# Patient Record
Sex: Female | Born: 1945 | Race: White | Hispanic: No | Smoking: Never smoker
Health system: Southern US, Community
[De-identification: ages and names within clinical notes are randomized; demographics above are authoritative.]

## PROBLEM LIST (undated history)

## (undated) DIAGNOSIS — G4730 Sleep apnea, unspecified: Secondary | ICD-10-CM

## (undated) DIAGNOSIS — M5136 Other intervertebral disc degeneration, lumbar region: Secondary | ICD-10-CM

## (undated) DIAGNOSIS — R7303 Prediabetes: Secondary | ICD-10-CM

## (undated) DIAGNOSIS — T4145XA Adverse effect of unspecified anesthetic, initial encounter: Secondary | ICD-10-CM

## (undated) DIAGNOSIS — E2839 Other primary ovarian failure: Secondary | ICD-10-CM

## (undated) DIAGNOSIS — Z8619 Personal history of other infectious and parasitic diseases: Secondary | ICD-10-CM

## (undated) DIAGNOSIS — H40033 Anatomical narrow angle, bilateral: Secondary | ICD-10-CM

## (undated) DIAGNOSIS — Z8711 Personal history of peptic ulcer disease: Secondary | ICD-10-CM

## (undated) DIAGNOSIS — H269 Unspecified cataract: Secondary | ICD-10-CM

## (undated) DIAGNOSIS — Z8719 Personal history of other diseases of the digestive system: Secondary | ICD-10-CM

## (undated) DIAGNOSIS — Z87442 Personal history of urinary calculi: Secondary | ICD-10-CM

## (undated) DIAGNOSIS — H409 Unspecified glaucoma: Secondary | ICD-10-CM

## (undated) DIAGNOSIS — G709 Myoneural disorder, unspecified: Secondary | ICD-10-CM

## (undated) DIAGNOSIS — B9620 Unspecified Escherichia coli [E. coli] as the cause of diseases classified elsewhere: Secondary | ICD-10-CM

## (undated) DIAGNOSIS — K219 Gastro-esophageal reflux disease without esophagitis: Secondary | ICD-10-CM

## (undated) DIAGNOSIS — M179 Osteoarthritis of knee, unspecified: Secondary | ICD-10-CM

## (undated) DIAGNOSIS — M51369 Other intervertebral disc degeneration, lumbar region without mention of lumbar back pain or lower extremity pain: Secondary | ICD-10-CM

## (undated) DIAGNOSIS — I071 Rheumatic tricuspid insufficiency: Secondary | ICD-10-CM

## (undated) DIAGNOSIS — N390 Urinary tract infection, site not specified: Secondary | ICD-10-CM

## (undated) DIAGNOSIS — M48062 Spinal stenosis, lumbar region with neurogenic claudication: Secondary | ICD-10-CM

## (undated) DIAGNOSIS — J301 Allergic rhinitis due to pollen: Secondary | ICD-10-CM

## (undated) DIAGNOSIS — M1710 Unilateral primary osteoarthritis, unspecified knee: Secondary | ICD-10-CM

## (undated) DIAGNOSIS — D509 Iron deficiency anemia, unspecified: Secondary | ICD-10-CM

## (undated) DIAGNOSIS — I10 Essential (primary) hypertension: Secondary | ICD-10-CM

## (undated) DIAGNOSIS — E785 Hyperlipidemia, unspecified: Secondary | ICD-10-CM

## (undated) HISTORY — DX: Rheumatic tricuspid insufficiency: I07.1

## (undated) HISTORY — PX: OTHER SURGICAL HISTORY: SHX169

## (undated) HISTORY — DX: Unilateral primary osteoarthritis, unspecified knee: M17.10

## (undated) HISTORY — DX: Unspecified Escherichia coli (E. coli) as the cause of diseases classified elsewhere: B96.20

## (undated) HISTORY — DX: Iron deficiency anemia, unspecified: D50.9

## (undated) HISTORY — DX: Anatomical narrow angle, bilateral: H40.033

## (undated) HISTORY — DX: Other intervertebral disc degeneration, lumbar region: M51.36

## (undated) HISTORY — PX: EYE SURGERY: SHX253

## (undated) HISTORY — DX: Other intervertebral disc degeneration, lumbar region without mention of lumbar back pain or lower extremity pain: M51.369

## (undated) HISTORY — DX: Hyperlipidemia, unspecified: E78.5

## (undated) HISTORY — PX: APPENDECTOMY: SHX54

## (undated) HISTORY — DX: Personal history of peptic ulcer disease: Z87.11

## (undated) HISTORY — PX: FOOT SURGERY: SHX648

## (undated) HISTORY — DX: Prediabetes: R73.03

## (undated) HISTORY — DX: Osteoarthritis of knee, unspecified: M17.9

## (undated) HISTORY — PX: VAGINAL HYSTERECTOMY: SUR661

## (undated) HISTORY — DX: Unspecified glaucoma: H40.9

## (undated) HISTORY — DX: Allergic rhinitis due to pollen: J30.1

## (undated) HISTORY — DX: Urinary tract infection, site not specified: N39.0

## (undated) HISTORY — DX: Unspecified cataract: H26.9

## (undated) HISTORY — DX: Essential (primary) hypertension: I10

## (undated) HISTORY — DX: Spinal stenosis, lumbar region with neurogenic claudication: M48.062

## (undated) HISTORY — DX: Personal history of other diseases of the digestive system: Z87.19

## (undated) HISTORY — DX: Personal history of other infectious and parasitic diseases: Z86.19

---

## 2003-09-01 HISTORY — PX: TOE SURGERY: SHX1073

## 2010-08-31 DIAGNOSIS — D509 Iron deficiency anemia, unspecified: Secondary | ICD-10-CM

## 2010-08-31 DIAGNOSIS — I071 Rheumatic tricuspid insufficiency: Secondary | ICD-10-CM

## 2010-08-31 HISTORY — DX: Rheumatic tricuspid insufficiency: I07.1

## 2010-08-31 HISTORY — DX: Iron deficiency anemia, unspecified: D50.9

## 2011-08-01 HISTORY — PX: ESOPHAGOGASTRODUODENOSCOPY: SHX1529

## 2011-08-01 HISTORY — PX: COLONOSCOPY: SHX174

## 2011-08-10 LAB — HM COLONOSCOPY

## 2011-11-06 ENCOUNTER — Ambulatory Visit: Payer: Self-pay | Admitting: Internal Medicine

## 2011-11-24 ENCOUNTER — Ambulatory Visit: Payer: Self-pay | Admitting: Internal Medicine

## 2011-11-24 LAB — HM MAMMOGRAPHY

## 2011-12-18 ENCOUNTER — Ambulatory Visit: Payer: Self-pay | Admitting: Internal Medicine

## 2012-11-24 ENCOUNTER — Ambulatory Visit: Payer: Self-pay | Admitting: Internal Medicine

## 2012-11-24 LAB — HM MAMMOGRAPHY

## 2013-12-22 ENCOUNTER — Ambulatory Visit: Payer: Self-pay | Admitting: Internal Medicine

## 2013-12-22 LAB — HM MAMMOGRAPHY

## 2014-08-31 DIAGNOSIS — M48062 Spinal stenosis, lumbar region with neurogenic claudication: Secondary | ICD-10-CM

## 2014-08-31 HISTORY — DX: Spinal stenosis, lumbar region with neurogenic claudication: M48.062

## 2014-09-20 ENCOUNTER — Ambulatory Visit: Payer: Self-pay | Admitting: General Practice

## 2014-10-12 LAB — COMPREHENSIVE METABOLIC PANEL
ALK PHOS: 69 U/L
ALT: 15
ALT: 15
AST: 21 U/L
AST: 21 U/L
Alkaline Phosphatase: 69 U/L
BUN: 26 mg/dL — AB (ref 4–21)
Bilirubin: 0.8
CREATININE: 0.71
CREATININE: 0.71
GLUCOSE: 105
Glucose: 105
POTASSIUM: 4.2 mmol/L
POTASSIUM: 4.2 mmol/L
SODIUM: 138
Sodium: 138

## 2014-10-12 LAB — LIPID PANEL
CHOLESTEROL, TOTAL: 238
Cholesterol, Total: 238
HDL: 96 mg/dL — AB (ref 35–70)
HDL: 96 mg/dL — AB (ref 35–70)
LDL (calc): 120
LDL CALC: 120
TRIGLYCERIDES: 110
Triglycerides: 110

## 2014-10-12 LAB — HEMOGLOBIN A1C
A1C: 6.2
A1c: 6.2

## 2014-10-12 LAB — CBC
HGB: 12.4 g/dL
WBC: 7.2
platelet count: 302

## 2014-12-25 ENCOUNTER — Encounter: Payer: Self-pay | Admitting: Family Medicine

## 2014-12-25 ENCOUNTER — Encounter (INDEPENDENT_AMBULATORY_CARE_PROVIDER_SITE_OTHER): Payer: Self-pay

## 2014-12-25 ENCOUNTER — Ambulatory Visit (INDEPENDENT_AMBULATORY_CARE_PROVIDER_SITE_OTHER): Payer: Medicare Other | Admitting: Family Medicine

## 2014-12-25 VITALS — BP 128/68 | HR 66 | Temp 98.2°F | Ht 58.75 in | Wt 142.1 lb

## 2014-12-25 DIAGNOSIS — E785 Hyperlipidemia, unspecified: Secondary | ICD-10-CM | POA: Insufficient documentation

## 2014-12-25 DIAGNOSIS — M5136 Other intervertebral disc degeneration, lumbar region: Secondary | ICD-10-CM | POA: Diagnosis not present

## 2014-12-25 DIAGNOSIS — M1710 Unilateral primary osteoarthritis, unspecified knee: Secondary | ICD-10-CM | POA: Insufficient documentation

## 2014-12-25 DIAGNOSIS — I10 Essential (primary) hypertension: Secondary | ICD-10-CM | POA: Diagnosis not present

## 2014-12-25 DIAGNOSIS — H409 Unspecified glaucoma: Secondary | ICD-10-CM | POA: Insufficient documentation

## 2014-12-25 DIAGNOSIS — M179 Osteoarthritis of knee, unspecified: Secondary | ICD-10-CM | POA: Insufficient documentation

## 2014-12-25 DIAGNOSIS — M170 Bilateral primary osteoarthritis of knee: Secondary | ICD-10-CM

## 2014-12-25 MED ORDER — SIMVASTATIN 10 MG PO TABS: 10.0000 mg | ORAL_TABLET | Freq: Every day | ORAL | Status: DC

## 2014-12-25 NOTE — Progress Notes (Signed)
BP 128/68 mmHg  Pulse 66  Temp(Src) 98.2 F (36.8 C) (Oral)  Ht 4' 10.75" (1.492 m)  Wt 142 lb 1.9 oz (64.465 kg)  BMI 28.96 kg/m2   CC: new pt to establish  Subjective:    Patient ID: Brandy Young, female    DOB: November 12, 1945, 69 y.o.   MRN: [id number]  HPI: Brandy Young is a 69 y.o. female presenting on 12/25/2014 for Establish Care   Prior saw Dr Yves Dill.   Glaucoma - eye drops, followed by Dr Inez Pilgrim. S/p L eye surgery.  HTN - compliant with lisinopril hctz daily. No HA, vision changes, CP/tightness, SOB, leg swelling.   HLD - compliant with simvastatin. No myalgias.  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 Started on meloxicam  daily, also started on omeprazole daily. Has had MRIs in the past. S/p L knee injection without improvement. Wants to avoid knee replacement if possible. Has also received ESI by Dr Yves Dill. Lower leg pain thought coming from bad back. Continues to function - works at [employer]. No claudication sxs. On hydrocodone and meloxicam today.   Preventative: Flu shot - done Pneumococcal 2015, prevnar at [facility] zostavax 2010  Lives alone, 1 dog. Widow - heart issues Children nearby Occupation: part time Engineer, [employer]: HS Activity: stays active with family and church Diet: good water, fruits/vegetables daily  Relevant past medical, surgical, family and social history reviewed and updated as indicated. Interim medical history since our last visit reviewed. Allergies and medications reviewed and updated. No current outpatient prescriptions on file prior to visit.   No current facility-administered medications on file prior to visit.    Review of Systems Per HPI unless specifically indicated above     Objective:    BP 128/68 mmHg  Pulse 66  Temp(Src) 98.2 F (36.8 C) (Oral)  Ht 4' 10.75" (1.492 m)  Wt 142 lb 1.9 oz (64.465 kg)  BMI 28.96 kg/m2  Wt Readings from  Last 3 Encounters:  12/25/14 142 lb 1.9 oz (64.465 kg)    Physical Exam  Constitutional: She appears well-developed and well-nourished. No distress.  HENT:  Head: Normocephalic and atraumatic.  Mouth/Throat: Oropharynx is clear and moist. No oropharyngeal exudate.  Eyes: Conjunctivae and EOM are normal. Pupils are equal, round, and reactive to light. No scleral icterus.  Neck: Normal range of motion.  Cardiovascular: Normal rate, regular rhythm, normal heart sounds and intact distal pulses.   No murmur heard. Pulmonary/Chest: Effort normal and breath sounds normal. No respiratory distress. She has no wheezes. She has no rales.  Musculoskeletal: She exhibits no edema.  Lymphadenopathy:    She has no cervical adenopathy.  Skin: Skin is warm and dry. No rash noted.  Psychiatric: She has a normal mood and affect.  Nursing note and vitals reviewed.  No results found for this or any previous visit.    Assessment & Plan:   Problem List Items Addressed This Visit    HTN (hypertension) - Primary    Chronic, stable. Continue regimen.       Relevant Medications   lisinopril-hydrochlorothiazide (PRINZIDE,ZESTORETIC) 20-25 MG per tablet   aspirin 81 MG tablet   simvastatin (ZOCOR) 10 MG tablet   HLD (hyperlipidemia)    Chronic, latest LDL 120, but HDL 90s. Continue simvastatin and fish oil. Check FLP next fasting labs.      Relevant Medications   lisinopril-hydrochlorothiazide (PRINZIDE,ZESTORETIC) 20-25 MG per tablet   aspirin  81 MG tablet   simvastatin (ZOCOR) 10 MG tablet   Glaucoma    Followed regularly by ophtho.      Relevant Medications   latanoprost (XALATAN) 0.005 % ophthalmic solution   timolol (BETIMOL) 0.5 % ophthalmic solution   DJD (degenerative joint disease) of knee    Will request records from Dr Ernest PineHooten. Pt wants to avoid knee replacement if at all possible.      Relevant Medications   meloxicam (MOBIC) 15 MG tablet   aspirin 81 MG tablet    HYDROcodone-acetaminophen (NORCO) 7.5-325 MG per tablet   DDD (degenerative disc disease), lumbar    S/p ESI in past. Followed by Dr Yves Dillhasnis, hydrocodone prescribed by him.      Relevant Medications   meloxicam (MOBIC) 15 MG tablet   aspirin 81 MG tablet   HYDROcodone-acetaminophen (NORCO) 7.5-325 MG per tablet       Follow up plan: Return in about 3 months (around 03/26/2015), or as needed, for medicare wellness visit.

## 2014-12-25 NOTE — Assessment & Plan Note (Addendum)
S/p ESI in past. Followed by Dr Yves Dillhasnis, hydrocodone prescribed by him.

## 2014-12-25 NOTE — Progress Notes (Signed)
Pre visit review using our clinic review tool, if applicable. No additional management support is needed unless otherwise documented below in the visit note. 

## 2014-12-25 NOTE — Assessment & Plan Note (Signed)
Chronic, stable. Continue regimen. 

## 2014-12-25 NOTE — Assessment & Plan Note (Signed)
Followed regularly by ophtho.  

## 2014-12-25 NOTE — Assessment & Plan Note (Signed)
Will request records from Dr Ernest PineHooten. Pt wants to avoid knee replacement if at all possible.

## 2014-12-25 NOTE — Patient Instructions (Addendum)
Let's hold meloxicam for now. Monitor pain off this medication.  Return at your convenience over next 3-4 months for medicare wellness visit, prior fasting for blood work  I will request latest physical from Dr Welton FlakesKhan as well as Dr Ernest PineHooten and Chasnis.

## 2014-12-25 NOTE — Assessment & Plan Note (Signed)
Chronic, latest LDL 120, but HDL 90s. Continue simvastatin and fish oil. Check FLP next fasting labs.

## 2014-12-27 ENCOUNTER — Encounter: Payer: Self-pay | Admitting: *Deleted

## 2015-01-04 DIAGNOSIS — H40033 Anatomical narrow angle, bilateral: Secondary | ICD-10-CM | POA: Diagnosis not present

## 2015-01-05 ENCOUNTER — Encounter: Payer: Self-pay | Admitting: Family Medicine

## 2015-01-10 ENCOUNTER — Other Ambulatory Visit: Payer: Self-pay | Admitting: Family Medicine

## 2015-01-10 DIAGNOSIS — Z1231 Encounter for screening mammogram for malignant neoplasm of breast: Secondary | ICD-10-CM

## 2015-01-12 ENCOUNTER — Encounter: Payer: Self-pay | Admitting: Family Medicine

## 2015-01-12 DIAGNOSIS — J301 Allergic rhinitis due to pollen: Secondary | ICD-10-CM | POA: Insufficient documentation

## 2015-01-12 DIAGNOSIS — D509 Iron deficiency anemia, unspecified: Secondary | ICD-10-CM

## 2015-01-12 DIAGNOSIS — M48062 Spinal stenosis, lumbar region with neurogenic claudication: Secondary | ICD-10-CM

## 2015-01-12 DIAGNOSIS — R7303 Prediabetes: Secondary | ICD-10-CM | POA: Insufficient documentation

## 2015-01-18 ENCOUNTER — Ambulatory Visit
Admission: RE | Admit: 2015-01-18 | Discharge: 2015-01-18 | Disposition: A | Discharge status: Home / Self Care | Payer: Medicare Other | Source: Ambulatory Visit | Attending: Family Medicine | Admitting: Family Medicine

## 2015-01-18 DIAGNOSIS — Z1231 Encounter for screening mammogram for malignant neoplasm of breast: Secondary | ICD-10-CM | POA: Diagnosis not present

## 2015-01-21 ENCOUNTER — Encounter: Payer: Self-pay | Admitting: *Deleted

## 2015-01-29 ENCOUNTER — Other Ambulatory Visit: Payer: Self-pay | Admitting: *Deleted

## 2015-01-29 MED ORDER — LISINOPRIL-HYDROCHLOROTHIAZIDE 20-25 MG PO TABS: 1.0000 | ORAL_TABLET | Freq: Every day | ORAL | Status: DC

## 2015-02-06 ENCOUNTER — Encounter: Payer: Self-pay | Admitting: *Deleted

## 2015-02-11 DIAGNOSIS — M4806 Spinal stenosis, lumbar region: Secondary | ICD-10-CM | POA: Diagnosis not present

## 2015-02-11 DIAGNOSIS — M5136 Other intervertebral disc degeneration, lumbar region: Secondary | ICD-10-CM | POA: Diagnosis not present

## 2015-02-11 DIAGNOSIS — M5416 Radiculopathy, lumbar region: Secondary | ICD-10-CM | POA: Diagnosis not present

## 2015-02-27 DIAGNOSIS — M4806 Spinal stenosis, lumbar region: Secondary | ICD-10-CM | POA: Diagnosis not present

## 2015-02-27 DIAGNOSIS — M5416 Radiculopathy, lumbar region: Secondary | ICD-10-CM | POA: Diagnosis not present

## 2015-02-27 DIAGNOSIS — M5136 Other intervertebral disc degeneration, lumbar region: Secondary | ICD-10-CM | POA: Diagnosis not present

## 2015-03-15 ENCOUNTER — Encounter: Payer: Self-pay | Admitting: Family Medicine

## 2015-03-19 DIAGNOSIS — M5416 Radiculopathy, lumbar region: Secondary | ICD-10-CM | POA: Diagnosis not present

## 2015-03-19 DIAGNOSIS — M4806 Spinal stenosis, lumbar region: Secondary | ICD-10-CM | POA: Diagnosis not present

## 2015-03-19 DIAGNOSIS — M5136 Other intervertebral disc degeneration, lumbar region: Secondary | ICD-10-CM | POA: Diagnosis not present

## 2015-04-04 ENCOUNTER — Encounter: Payer: Self-pay | Admitting: Family Medicine

## 2015-04-14 ENCOUNTER — Other Ambulatory Visit: Payer: Self-pay | Admitting: Family Medicine

## 2015-04-14 DIAGNOSIS — I10 Essential (primary) hypertension: Secondary | ICD-10-CM

## 2015-04-14 DIAGNOSIS — R7303 Prediabetes: Secondary | ICD-10-CM

## 2015-04-14 DIAGNOSIS — E785 Hyperlipidemia, unspecified: Secondary | ICD-10-CM

## 2015-04-16 ENCOUNTER — Other Ambulatory Visit (INDEPENDENT_AMBULATORY_CARE_PROVIDER_SITE_OTHER): Payer: Medicare Other

## 2015-04-16 DIAGNOSIS — I10 Essential (primary) hypertension: Secondary | ICD-10-CM

## 2015-04-16 DIAGNOSIS — E785 Hyperlipidemia, unspecified: Secondary | ICD-10-CM

## 2015-04-16 DIAGNOSIS — R7309 Other abnormal glucose: Secondary | ICD-10-CM

## 2015-04-16 DIAGNOSIS — R7303 Prediabetes: Secondary | ICD-10-CM

## 2015-04-16 LAB — HEMOGLOBIN A1C: Hgb A1c MFr Bld: 6.1 % (ref 4.6–6.5)

## 2015-04-16 LAB — BASIC METABOLIC PANEL
BUN: 26 mg/dL — ABNORMAL HIGH (ref 6–23)
CO2: 28 mEq/L (ref 19–32)
CREATININE: 0.62 mg/dL (ref 0.40–1.20)
Calcium: 9.3 mg/dL (ref 8.4–10.5)
Chloride: 105 mEq/L (ref 96–112)
GFR: 100.71 mL/min (ref >60.00)
GLUCOSE: 100 mg/dL — AB (ref 70–99)
POTASSIUM: 3.5 meq/L (ref 3.5–5.1)
Sodium: 139 mEq/L (ref 135–145)

## 2015-04-16 LAB — LIPID PANEL
CHOLESTEROL: 214 mg/dL — AB (ref 0–200)
HDL: 66.5 mg/dL (ref >39.00)
LDL CALC: 127 mg/dL — AB (ref 0–99)
NonHDL: 147.53
Total CHOL/HDL Ratio: 3
Triglycerides: 103 mg/dL (ref 0.0–149.0)
VLDL: 20.6 mg/dL (ref 0.0–40.0)

## 2015-04-19 ENCOUNTER — Encounter: Payer: Self-pay | Admitting: Family Medicine

## 2015-04-19 ENCOUNTER — Ambulatory Visit (INDEPENDENT_AMBULATORY_CARE_PROVIDER_SITE_OTHER): Payer: Medicare Other | Admitting: Family Medicine

## 2015-04-19 VITALS — BP 124/74 | HR 70 | Temp 98.4°F | Ht 59.0 in | Wt 139.2 lb

## 2015-04-19 DIAGNOSIS — E785 Hyperlipidemia, unspecified: Secondary | ICD-10-CM

## 2015-04-19 DIAGNOSIS — Z0000 Encounter for general adult medical examination without abnormal findings: Secondary | ICD-10-CM | POA: Insufficient documentation

## 2015-04-19 DIAGNOSIS — Z7189 Other specified counseling: Secondary | ICD-10-CM | POA: Insufficient documentation

## 2015-04-19 DIAGNOSIS — I10 Essential (primary) hypertension: Secondary | ICD-10-CM

## 2015-04-19 DIAGNOSIS — R0789 Other chest pain: Secondary | ICD-10-CM

## 2015-04-19 DIAGNOSIS — M48062 Spinal stenosis, lumbar region with neurogenic claudication: Secondary | ICD-10-CM

## 2015-04-19 DIAGNOSIS — R079 Chest pain, unspecified: Secondary | ICD-10-CM | POA: Insufficient documentation

## 2015-04-19 DIAGNOSIS — R7303 Prediabetes: Secondary | ICD-10-CM

## 2015-04-19 NOTE — Assessment & Plan Note (Signed)
Chronic, continue simvastatin. Discussed recent labwork as well as healthy diet changes to improve #s specifically LDL.

## 2015-04-19 NOTE — Progress Notes (Addendum)
BP 124/74 mmHg  Pulse 70  Temp(Src) 98.4 F (36.9 C) (Oral)  Ht  (1.499 m)  Wt 139 lb 4 oz (63.163 kg)  BMI 28.11 kg/m2  SpO2 99%   CC: medicare wellness visit  Subjective:    Patient ID: Brandy Young, female    DOB: [birth year], 69 y.o.   MRN: [id number]  HPI: Brandy Young is a 69 y.o. female presenting on 04/19/2015 for Annual Exam   Occasional chest discomfort x2 with mowing yard over last few months in heat - push mower. Last occurred in July. Has not exercised since. Has not seen cardiology in the past.   Passes hearing. Vision screen with eye doctor. Denies depression. 2+ falls in last year. [place].   Preventative: COLONOSCOPY Date: 08/2011 diverticulosis, int hem ([facility]) Well woman exam - s/p hysterectomy, ovaries remain. Aged out. Mammogram - WNL 12/2014 DEXA in [place] >6 yrs ago - normal. No records available.  Flu shot - yearly Tdap 2014 Prevnar 07/2014. pneumovax previously per patient zostavax 2010 Advanced directives: has not set up but has discussed with children. HCPOA - children. Packet provided today. Seat belt use discussed Sunscreen use discussed  Lives alone, 1 dog. Widow - heart issues Children nearby Occupation: part time Engineer, [employer]: HS Activity: stays active with family and church Diet: good water, fruits/vegetables daily  Relevant past medical, surgical, family and social history reviewed and updated as indicated. Interim medical history since our last visit reviewed. Allergies and medications reviewed and updated. Current Outpatient Prescriptions on File Prior to Visit  Medication Sig  . aspirin 81 MG tablet Take 81 mg by mouth daily.  . calcium carbonate (OS-CAL) 600 MG TABS tablet Take 600 mg by mouth 2 (two) times daily with a meal.  . cetirizine (ZYRTEC) 10 MG tablet Take 10 mg by mouth daily.  . cholecalciferol (VITAMIN D) 1000 UNITS tablet Take 1,000  Units by mouth 2 (two) times daily.  Marland Kitchen HYDROcodone-acetaminophen (NORCO) 7.5-325 MG per tablet Take 1 tablet by mouth as needed for moderate pain.  Marland Kitchen latanoprost (XALATAN) 0.005 % ophthalmic solution Place 1 drop into both eyes at bedtime.  Marland Kitchen lisinopril-hydrochlorothiazide (PRINZIDE,ZESTORETIC) 20-25 MG per tablet Take 1 tablet by mouth daily.  . Omega-3 Fatty Acids (FISH OIL) 1200 MG CAPS Take 1 capsule by mouth 2 (two) times daily.  . psyllium (METAMUCIL) 58.6 % powder Take 1 packet by mouth daily.  . simvastatin (ZOCOR) 10 MG tablet Take 1 tablet (10 mg total) by mouth daily.  . timolol (BETIMOL) 0.5 % ophthalmic solution Place 1 drop into both eyes daily.   No current facility-administered medications on file prior to visit.    Review of Systems  Constitutional: Negative for fever, chills, activity change, appetite change, fatigue and unexpected weight change.  HENT: Negative for hearing loss.   Eyes: Negative for visual disturbance.  Respiratory: Positive for chest tightness. Negative for cough, shortness of breath and wheezing.        See HPI  Cardiovascular: Positive for leg swelling (occasional). Negative for chest pain and palpitations.  Gastrointestinal: Negative for nausea, vomiting, abdominal pain, diarrhea, constipation, blood in stool and abdominal distention.  Genitourinary: Negative for hematuria and difficulty urinating.  Musculoskeletal: Negative for myalgias, arthralgias and neck pain.  Skin: Negative for rash.  Neurological: Negative for dizziness, seizures, syncope and headaches.  Hematological: Negative for adenopathy. Does not bruise/bleed easily.  Psychiatric/Behavioral: Negative for dysphoric mood. The patient  is not nervous/anxious.    Per HPI unless specifically indicated above     Objective:    BP 124/74 mmHg  Pulse 70  Temp(Src) 98.4 F (36.9 C) (Oral)  Ht 4\' 11"  (1.499 m)  Wt 139 lb 4 oz (63.163 kg)  BMI 28.11 kg/m2  SpO2 99%  Wt Readings from  Last 3 Encounters:  04/19/15 139 lb 4 oz (63.163 kg)  12/25/14 142 lb 1.9 oz (64.465 kg)    Physical Exam  Constitutional: She is oriented to person, place, and time. She appears well-developed and well-nourished. No distress.  HENT:  Head: Normocephalic and atraumatic.  Right Ear: Hearing, tympanic membrane, external ear and ear canal normal.  Left Ear: Hearing, tympanic membrane, external ear and ear canal normal.  Nose: Nose normal.  Mouth/Throat: Uvula is midline, oropharynx is clear and moist and mucous membranes are normal. No oropharyngeal exudate, posterior oropharyngeal edema or posterior oropharyngeal erythema.  Eyes: Conjunctivae and EOM are normal. Pupils are equal, round, and reactive to light. No scleral icterus.  Neck: Normal range of motion. Neck supple. Carotid bruit is not present. No thyromegaly present.  Cardiovascular: Normal rate, regular rhythm, normal heart sounds and intact distal pulses.   No murmur heard. Pulses:      Radial pulses are 2+ on the right side, and 2+ on the left side.  Pulmonary/Chest: Effort normal and breath sounds normal. No respiratory distress. She has no wheezes. She has no rales.  Breast - declines  Abdominal: Soft. Bowel sounds are normal. She exhibits no distension and no mass. There is no tenderness. There is no rebound and no guarding.  Genitourinary:  GYN - deferred  Musculoskeletal: Normal range of motion. She exhibits no edema.  Lymphadenopathy:    She has no cervical adenopathy.  Neurological: She is alert and oriented to person, place, and time.  CN grossly intact, station and gait intact Recall 3/3  Calculation 4/5 serial 7s  Skin: Skin is warm and dry. No rash noted.  Psychiatric: She has a normal mood and affect. Her behavior is normal. Judgment and thought content normal.  Nursing note and vitals reviewed.  Results for orders placed or performed in visit on 04/16/15  Lipid panel  Result Value Ref Range   Cholesterol 214  (H) 0 - 200 mg/dL   Triglycerides 161.0 0.0 - 149.0 mg/dL   HDL 96.04 >54.09 mg/dL   VLDL 81.1 0.0 - 91.4 mg/dL   LDL Cholesterol 782 (H) 0 - 99 mg/dL   Total CHOL/HDL Ratio 3    NonHDL 147.53   Basic metabolic panel  Result Value Ref Range   Sodium 139 135 - 145 mEq/L   Potassium 3.5 3.5 - 5.1 mEq/L   Chloride 105 96 - 112 mEq/L   CO2 28 19 - 32 mEq/L   Glucose, Bld 100 (H) 70 - 99 mg/dL   BUN 26 (H) 6 - 23 mg/dL   Creatinine, Ser 9.56 0.40 - 1.20 mg/dL   Calcium 9.3 8.4 - 21.3 mg/dL   GFR 086.57 >84.69 mL/min  Hemoglobin A1c  Result Value Ref Range   Hgb A1c MFr Bld 6.1 4.6 - 6.5 %      Assessment & Plan:   Problem List Items Addressed This Visit    HTN (hypertension)    Chronic, stable. Continue current regimen.      HLD (hyperlipidemia)    Chronic, continue simvastatin. Discussed recent labwork as well as healthy diet changes to improve #s specifically LDL.  Prediabetes    Discussed dx , rec avoiding added sugars and sweetened beverages.      Lumbar stenosis with neurogenic claudication    Continue f/u with Dr Yves Dill. Pt doesn't find ESI helpful - suggested she discuss other options with PM&R      Medicare annual wellness visit, subsequent - Primary    I have personally reviewed the Medicare Annual Wellness questionnaire and have noted 1. The patient's medical and social history 2. Their use of alcohol, tobacco or illicit drugs 3. Their current medications and supplements 4. The patient's functional ability including ADL's, fall risks, home safety risks and hearing or visual impairment. Cognitive function has been assessed and addressed as indicated.  5. Diet and physical activity 6. Evidence for depression or mood disorders The patients weight, height, BMI have been recorded in the chart. I have made referrals, counseling and provided education to the patient based on review of the above and I have provided the pt with a written personalized care plan for  preventive services. Provider list updated.. See scanned questionairre as needed for further documentation. Reviewed preventative protocols and updated unless pt declined.       Health maintenance examination    Preventative protocols reviewed and updated unless pt declined. Discussed healthy diet and lifestyle.       Advanced care planning/counseling discussion    Advanced directives: has not set up but has discussed with children. HCPOA - children. Packet provided today.      Chest pain    Some concerning features of chest pain including substernal pressure sensation with exertion. Will check EKG today and refer to cards for further evaluation. Pt agrees with plan. EKG today with NSR rate 60, normal axis, intervals, no acute ST/T changes.      Relevant Orders   Ambulatory referral to Cardiology       Follow up plan: Return in about 6 months (around 10/20/2015), or as needed, for follow up visit.

## 2015-04-19 NOTE — Progress Notes (Signed)
Pre visit review using our clinic review tool, if applicable. No additional management support is needed unless otherwise documented below in the visit note. 

## 2015-04-19 NOTE — Assessment & Plan Note (Addendum)
Some concerning features of chest pain including substernal pressure sensation with exertion. Will check EKG today and refer to cards for further evaluation. Pt agrees with plan. EKG today with NSR rate 60, normal axis, intervals, no acute ST/T changes.

## 2015-04-19 NOTE — Assessment & Plan Note (Signed)
Advanced directives: has not set up but has discussed with children. HCPOA - children. Packet provided today. 

## 2015-04-19 NOTE — Assessment & Plan Note (Signed)
Preventative protocols reviewed and updated unless pt declined. Discussed healthy diet and lifestyle.  

## 2015-04-19 NOTE — Assessment & Plan Note (Signed)
Continue f/u with Dr Yves Dill. Pt doesn't find ESI helpful - suggested she discuss other options with PM&R

## 2015-04-19 NOTE — Assessment & Plan Note (Signed)

## 2015-04-19 NOTE — Patient Instructions (Addendum)
Let us know when you're going to schedule your next mammogram and we will order bone density scan on same day.  Advanced directive packet provided today. EKG today. Good to see you today, return as needed or in 6 months for follow up, prior fasting for labs.

## 2015-04-19 NOTE — Assessment & Plan Note (Signed)
Discussed dx , rec avoiding added sugars and sweetened beverages.

## 2015-04-19 NOTE — Assessment & Plan Note (Signed)
Chronic, stable. Continue current regimen. 

## 2015-04-20 NOTE — Addendum Note (Signed)
Addended by: Eustaquio Boyden on: 04/20/2015 11:24 AM   Modules accepted: Kipp Brood

## 2015-04-22 ENCOUNTER — Encounter: Payer: Self-pay | Admitting: *Deleted

## 2015-04-29 DIAGNOSIS — M5136 Other intervertebral disc degeneration, lumbar region: Secondary | ICD-10-CM | POA: Diagnosis not present

## 2015-04-29 DIAGNOSIS — M4806 Spinal stenosis, lumbar region: Secondary | ICD-10-CM | POA: Diagnosis not present

## 2015-04-29 DIAGNOSIS — M5416 Radiculopathy, lumbar region: Secondary | ICD-10-CM | POA: Diagnosis not present

## 2015-05-08 NOTE — Telephone Encounter (Signed)
Pt request status of lisinopril HCTZ; spoke with [facility] [address] and refill on hold will get ready for pt. Pt voiced understanding.

## 2015-05-12 ENCOUNTER — Encounter: Payer: Self-pay | Admitting: Family Medicine

## 2015-05-12 ENCOUNTER — Other Ambulatory Visit: Payer: Self-pay | Admitting: Family Medicine

## 2015-07-05 ENCOUNTER — Ambulatory Visit: Payer: Medicare Other | Admitting: Cardiovascular Disease

## 2015-07-05 DIAGNOSIS — H401132 Primary open-angle glaucoma, bilateral, moderate stage: Secondary | ICD-10-CM | POA: Diagnosis not present

## 2015-07-09 ENCOUNTER — Encounter: Payer: Self-pay | Admitting: Cardiovascular Disease

## 2015-07-09 ENCOUNTER — Ambulatory Visit (INDEPENDENT_AMBULATORY_CARE_PROVIDER_SITE_OTHER): Payer: Medicare Other | Admitting: Cardiovascular Disease

## 2015-07-09 VITALS — BP 128/78 | HR 69 | Ht 60.0 in | Wt 139.0 lb

## 2015-07-09 DIAGNOSIS — R079 Chest pain, unspecified: Secondary | ICD-10-CM | POA: Diagnosis not present

## 2015-07-09 DIAGNOSIS — E785 Hyperlipidemia, unspecified: Secondary | ICD-10-CM

## 2015-07-09 DIAGNOSIS — I10 Essential (primary) hypertension: Secondary | ICD-10-CM

## 2015-07-09 DIAGNOSIS — R55 Syncope and collapse: Secondary | ICD-10-CM

## 2015-07-09 DIAGNOSIS — R7303 Prediabetes: Secondary | ICD-10-CM

## 2015-07-09 DIAGNOSIS — R0789 Other chest pain: Secondary | ICD-10-CM

## 2015-07-09 MED ORDER — HYDROCHLOROTHIAZIDE 25 MG PO TABS: 25.0000 mg | ORAL_TABLET | Freq: Every day | ORAL | Status: DC

## 2015-07-09 MED ORDER — POTASSIUM CHLORIDE ER 10 MEQ PO TBCR: 10.0000 meq | EXTENDED_RELEASE_TABLET | Freq: Every day | ORAL | Status: DC | PRN

## 2015-07-09 MED ORDER — LISINOPRIL 20 MG PO TABS: 20.0000 mg | ORAL_TABLET | Freq: Every day | ORAL | Status: DC

## 2015-07-09 NOTE — Assessment & Plan Note (Addendum)
Changes to her HCTZ as above. she will take this every other day or as needed for ankle swelling Suggested she take HCTZ with potassium

## 2015-07-09 NOTE — Patient Instructions (Addendum)
You are doing well.  We will order a 30 day monitor for syncope, palpitations You will receive a call from [employer] verifying your mailing address When you receive the monitor, call the 1-800 # and they will walk you through the process of applying the monitor  Split your pill (lisinopril and HCTZ) Consider taking HCTZ every other day or 1/2 pill daily  Take potassium when you take HCTZ  Call for any lightheaded spells/pass out spells Also call for chest burning symptoms  Please call us if you have new issues that need to be addressed before your next appt.    Cardiac Event Monitoring A cardiac event monitor is a small recording device used to help detect abnormal heart rhythms (arrhythmias). The monitor is used to record heart rhythm when noticeable symptoms such as the following occur:  Fast heartbeats (palpitations), such as heart racing or fluttering.  Dizziness.  Fainting or light-headedness.  Unexplained weakness. The monitor is wired to two electrodes placed on your chest. Electrodes are flat, sticky disks that attach to your skin. The monitor can be worn for up to 30 days. You will wear the monitor at all times, except when bathing.  HOW TO USE YOUR CARDIAC EVENT MONITOR A technician will prepare your chest for the electrode placement. The technician will show you how to place the electrodes, how to work the monitor, and how to replace the batteries. Take time to practice using the monitor before you leave the office. Make sure you understand how to send the information from the monitor to your health care provider. This requires a telephone with a landline, not a cell phone. You need to:  Wear your monitor at all times, except when you are in water:  Do not get the monitor wet.  Take the monitor off when bathing. Do not swim or use a hot tub with it on.  Keep your skin clean. Do not put body lotion or moisturizer on your chest.  Change the electrodes daily or any  time they stop sticking to your skin. You might need to use tape to keep them on.  It is possible that your skin under the electrodes could become irritated. To keep this from happening, try to put the electrodes in slightly different places on your chest. However, they must remain in the area under your left breast and in the upper right section of your chest.  Make sure the monitor is safely clipped to your clothing or in a location close to your body that your health care provider recommends.  Press the button to record when you feel symptoms of heart trouble, such as dizziness, weakness, light-headedness, palpitations, thumping, shortness of breath, unexplained weakness, or a fluttering or racing heart. The monitor is always on and records what happened slightly before you pressed the button, so do not worry about being too late to get good information.  Keep a diary of your activities, such as walking, doing chores, and taking medicine. It is especially important to note what you were doing when you pushed the button to record your symptoms. This will help your health care provider determine what might be contributing to your symptoms. The information stored in your monitor will be reviewed by your health care provider alongside your diary entries.  Send the recorded information as recommended by your health care provider. It is important to understand that it will take some time for your health care provider to process the results.  Change the batteries as  recommended by your health care provider. SEEK IMMEDIATE MEDICAL CARE IF:   You have chest pain.  You have extreme difficulty breathing or shortness of breath.  You develop a very fast heartbeat that persists.  You develop dizziness that does not go away.  You faint or constantly feel you are about to faint.   This information is not intended to replace advice given to you by your health care provider. Make sure you discuss any  questions you have with your health care provider.   Document Released: 05/26/2008 Document Revised: 09/07/2014 Document Reviewed: 02/13/2013 Elsevier Interactive Patient Education 2016 [employer]. Syncope Syncope is a medical term for fainting or passing out. This means you lose consciousness and drop to the ground. People are generally unconscious for less than 5 minutes. You may have some muscle twitches for up to 15 seconds before waking up and returning to normal. Syncope occurs more often in older adults, but it can happen to anyone. While most causes of syncope are not dangerous, syncope can be a sign of a serious medical problem. It is important to seek medical care.  CAUSES  Syncope is caused by a sudden drop in blood flow to the brain. The specific cause is often not determined. Factors that can bring on syncope include:  Taking medicines that lower blood pressure.  Sudden changes in posture, such as standing up quickly.  Taking more medicine than prescribed.  Standing in one place for too long.  Seizure disorders.  Dehydration and excessive exposure to heat.  Low blood sugar (hypoglycemia).  Straining to have a bowel movement.  Heart disease, irregular heartbeat, or other circulatory problems.  Fear, emotional distress, seeing blood, or severe pain. SYMPTOMS  Right before fainting, you may:  Feel dizzy or light-headed.  Feel nauseous.  See all white or all black in your field of vision.  Have cold, clammy skin. DIAGNOSIS  Your health care provider will ask about your symptoms, perform a physical exam, and perform an electrocardiogram (ECG) to record the electrical activity of your heart. Your health care provider may also perform other heart or blood tests to determine the cause of your syncope which may include:  Transthoracic echocardiogram (TTE). During echocardiography, sound waves are used to evaluate how blood flows through your heart.  Transesophageal  echocardiogram (TEE).  Cardiac monitoring. This allows your health care provider to monitor your heart rate and rhythm in real time.  Holter monitor. This is a portable device that records your heartbeat and can help diagnose heart arrhythmias. It allows your health care provider to track your heart activity for several days, if needed.  Stress tests by exercise or by giving medicine that makes the heart beat faster. TREATMENT  In most cases, no treatment is needed. Depending on the cause of your syncope, your health care provider may recommend changing or stopping some of your medicines. HOME CARE INSTRUCTIONS  Have someone stay with you until you feel stable.  Do not drive, use machinery, or play sports until your health care provider says it is okay.  Keep all follow-up appointments as directed by your health care provider.  Lie down right away if you start feeling like you might faint. Breathe deeply and steadily. Wait until all the symptoms have passed.  Drink enough fluids to keep your urine clear or pale yellow.  If you are taking blood pressure or heart medicine, get up slowly and take several minutes to sit and then stand. This can reduce dizziness.  SEEK IMMEDIATE MEDICAL CARE IF:   You have a severe headache.  You have unusual pain in the chest, abdomen, or back.  You are bleeding from your mouth or rectum, or you have black or tarry stool.  You have an irregular or very fast heartbeat.  You have pain with breathing.  You have repeated fainting or seizure-like jerking during an episode.  You faint when sitting or lying down.  You have confusion.  You have trouble walking.  You have severe weakness.  You have vision problems. If you fainted, call your local emergency services (911 in [place] Do not drive yourself to the hospital.    This information is not intended to replace advice given to you by your health care provider. Make sure you discuss any questions  you have with your health care provider.   Document Released: 08/17/2005 Document Revised: 01/01/2015 Document Reviewed: 10/16/2011 Elsevier Interactive Patient Education [employer]

## 2015-07-09 NOTE — Assessment & Plan Note (Signed)
Currently tolerating simvastatin 10 mg daily We have encouraged continued exercise, careful diet management in an effort to lose weight.

## 2015-07-09 NOTE — Progress Notes (Signed)
Patient ID: Brandy Young, female    DOB: 07-11-1946, 69 y.o.   MRN: [id number]  HPI Comments: [name] is a very pleasant 69 year old woman with prior history of syncope approximately 2 years ago in the setting of a upper respiratory infection, cough, presenting for evaluation of burning chest discomfort over the summer as well as recent episode of syncope.  She reports that over the summer time in the severe heat, she was pushing a lawnmower on several occasions and developed some burning in her mediastinal area. Symptoms resolved after she went back inside   She denies having any further episodes of chest burning, and is typically very active, moves many boxes at work. 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 Very active at baseline She does not remember the last time she had recurrent chest burning.  She also volunteered having recent episode of fluttering in her chest that progressed into near syncope and then syncope She found herself on her floor, hit the back of her head. This happened several weeks ago Since then she has had several additional episodes of fluttering Occasional lightheadedness She did not eat much that day, had a small breakfast, syncope at 1 PM  She believes the prior episode of syncope 2 years ago happened in the setting of severe coughing, she was driving and crossed the median strip after syncope  EKG not done on today's visit, prior EKG was essentially normal Recent lab work reviewed from August showing potassium 3.5    Allergies  Allergen Reactions  . Brandy Young [Brandy Young] Other (See Comments)    Blurry vision, off balance  . Niacin And Related Hives    Current Outpatient Prescriptions on File Prior to Visit  Medication Sig Dispense Refill  . aspirin 81 MG tablet Take 81 mg by mouth daily.    . calcium carbonate (OS-CAL) 600 MG TABS tablet Take 600 mg by mouth 2 (two) times daily with a meal.    . cetirizine  (ZYRTEC) 10 MG tablet Take 10 mg by mouth daily.    Marland Kitchen. HYDROcodone-acetaminophen (NORCO) 7.5-325 MG per tablet Take 1 tablet by mouth 3 (three) times daily as needed for moderate pain. (Dr Brandy Young)    . latanoprost (XALATAN) 0.005 % ophthalmic solution Place 1 drop into both eyes at bedtime.    . Omega-3 Fatty Acids (FISH OIL) 1200 MG CAPS Take 1 capsule by mouth 2 (two) times daily.    . psyllium (METAMUCIL) 58.6 % powder Take 1 packet by mouth daily.    . simvastatin (ZOCOR) 10 MG tablet Take 1 tablet (10 mg total) by mouth daily. 90 tablet 3  . timolol (BETIMOL) 0.5 % ophthalmic solution Place 1 drop into both eyes daily.     No current facility-administered medications on file prior to visit.    Past Medical History  Diagnosis Date  . History of chicken pox   . Glaucoma   . History of stomach ulcers 2010s  . HTN (hypertension)   . HLD (hyperlipidemia)   . DDD (degenerative disc disease), lumbar     with spinal stenosis and neurogenic claudication s/p ESI  . DJD (degenerative joint disease) of knee     failed steroid and synvisc injections  . 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 IDA (iron deficiency anemia) 2012    s/p normal EGD/colonoscopy (Brandy Young)  . Prediabetes   .  Allergic rhinitis due to pollen   . Tricuspid regurgitation 2012    by echo, EF 73% Brandy Young)    Past Surgical History  Procedure Laterality Date  . Appendectomy    . Vaginal hysterectomy  1980s    prolapsed uterus, ovaries remained  . Foot surgery Right   . Esi  multiple, latest 03/2015    R L5/S1, L L3/4, L L4/5 L5/S1, L L4/5 and L5/S1 - minimal relief (Brandy Young)  . Colonoscopy  08/2011    diverticulosis, int hem (Brandy Young)  . Esophagogastroduodenoscopy  08/2011    WNL    Social History  reports that she has never smoked. She has never used smokeless tobacco. She reports that she does not drink alcohol or use  illicit drugs.  Family History family history includes Breast cancer in her mother; Cancer (age of onset: 18) in her mother; Cancer (age of onset: 85) in her brother; Hypertension in her father; Stroke (age of onset: 99) in her father. There is no history of CAD or Diabetes.   Review of Systems  Constitutional: Negative.   Respiratory: Negative.   Cardiovascular: Negative.   Gastrointestinal: Negative.   Musculoskeletal: Negative.   Neurological: Positive for syncope and light-headedness.  Hematological: Negative.   Psychiatric/Behavioral: Negative.   All other systems reviewed and are negative.   BP 128/78 mmHg  Pulse 69  Ht 5' (1.524 m)  Wt 139 lb (63.05 kg)  BMI 27.15 kg/m2  Physical Exam  Constitutional: She is oriented to person, place, and time. She appears well-developed and well-nourished.  HENT:  Head: Normocephalic.  Nose: Nose normal.  Mouth/Throat: Oropharynx is clear and moist.  Eyes: Conjunctivae are normal. Pupils are equal, round, and reactive to light.  Neck: Normal range of motion. Neck supple. No JVD present.  Cardiovascular: Normal rate, regular rhythm, normal heart sounds and intact distal pulses.  Exam reveals no gallop and no friction rub.   No murmur heard. Pulmonary/Chest: Effort normal and breath sounds normal. No respiratory distress. She has no wheezes. She has no rales. She exhibits no tenderness.  Abdominal: Soft. Bowel sounds are normal. She exhibits no distension. There is no tenderness.  Musculoskeletal: Normal range of motion. She exhibits no edema or tenderness.  Lymphadenopathy:    She has no cervical adenopathy.  Neurological: She is alert and oriented to person, place, and time. Coordination normal.  Skin: Skin is warm and dry. No rash noted. No erythema.  Psychiatric: She has a normal mood and affect. Her behavior is normal. Judgment and thought content normal.

## 2015-07-09 NOTE — Assessment & Plan Note (Signed)
Etiology unclear, concerning for arrhythmia given her palpitations, fluttering prior to syncope She has had additional episodes of fluttering since that time 30 day monitor ordered to rule out arrhythmia given the acute onset of her symptoms We'll try to avoid low potassium in case this is exacerbating arrhythmia, recommended she cut her HCTZ in half for take this every other day, and take with potassium

## 2015-07-09 NOTE — Assessment & Plan Note (Signed)
She reports symptoms seem to present in the very hot weather with exertion. No further episodes since that time Currently very active at work, lifting boxes, walking with no reproducible symptoms. We have discussed the first treatment options including types of stress testing. She would like to wait to see if symptoms recur for any testing We will focus on her recent syncope for now. If she does have any further chest pain in the cooler weather on exertion, she would likely benefit from treadmill echocardiogram or treadmill Myoview

## 2015-07-09 NOTE — Assessment & Plan Note (Signed)
Recommended weight loss. Suggested she start a walking program. She is busy at work

## 2015-07-11 ENCOUNTER — Encounter (INDEPENDENT_AMBULATORY_CARE_PROVIDER_SITE_OTHER): Payer: Medicare Other

## 2015-07-11 DIAGNOSIS — R55 Syncope and collapse: Secondary | ICD-10-CM | POA: Diagnosis not present

## 2015-07-11 DIAGNOSIS — R079 Chest pain, unspecified: Secondary | ICD-10-CM | POA: Diagnosis not present

## 2015-07-12 DIAGNOSIS — H401132 Primary open-angle glaucoma, bilateral, moderate stage: Secondary | ICD-10-CM | POA: Diagnosis not present

## 2015-08-02 DIAGNOSIS — M5416 Radiculopathy, lumbar region: Secondary | ICD-10-CM | POA: Diagnosis not present

## 2015-08-02 DIAGNOSIS — M5136 Other intervertebral disc degeneration, lumbar region: Secondary | ICD-10-CM | POA: Diagnosis not present

## 2015-08-02 DIAGNOSIS — M4806 Spinal stenosis, lumbar region: Secondary | ICD-10-CM | POA: Diagnosis not present

## 2015-08-08 DIAGNOSIS — H401132 Primary open-angle glaucoma, bilateral, moderate stage: Secondary | ICD-10-CM | POA: Diagnosis not present

## 2015-11-01 DIAGNOSIS — M4806 Spinal stenosis, lumbar region: Secondary | ICD-10-CM | POA: Diagnosis not present

## 2015-11-01 DIAGNOSIS — M5136 Other intervertebral disc degeneration, lumbar region: Secondary | ICD-10-CM | POA: Diagnosis not present

## 2015-11-01 DIAGNOSIS — M5416 Radiculopathy, lumbar region: Secondary | ICD-10-CM | POA: Diagnosis not present

## 2015-11-05 ENCOUNTER — Other Ambulatory Visit: Payer: Self-pay | Admitting: Physical Medicine and Rehabilitation

## 2015-11-05 DIAGNOSIS — M5417 Radiculopathy, lumbosacral region: Secondary | ICD-10-CM

## 2015-11-09 ENCOUNTER — Encounter: Payer: Self-pay | Admitting: Family Medicine

## 2015-11-28 ENCOUNTER — Ambulatory Visit
Admission: RE | Admit: 2015-11-28 | Discharge: 2015-11-28 | Disposition: A | Discharge status: Home / Self Care | Payer: Medicare Other | Source: Ambulatory Visit | Attending: Physical Medicine and Rehabilitation | Admitting: Physical Medicine and Rehabilitation

## 2015-11-28 DIAGNOSIS — M47816 Spondylosis without myelopathy or radiculopathy, lumbar region: Secondary | ICD-10-CM | POA: Insufficient documentation

## 2015-11-28 DIAGNOSIS — M5126 Other intervertebral disc displacement, lumbar region: Secondary | ICD-10-CM | POA: Diagnosis not present

## 2015-11-28 DIAGNOSIS — M5416 Radiculopathy, lumbar region: Secondary | ICD-10-CM | POA: Insufficient documentation

## 2015-11-28 DIAGNOSIS — M5124 Other intervertebral disc displacement, thoracic region: Secondary | ICD-10-CM | POA: Insufficient documentation

## 2015-11-28 DIAGNOSIS — M4806 Spinal stenosis, lumbar region: Secondary | ICD-10-CM | POA: Insufficient documentation

## 2015-11-28 DIAGNOSIS — M5417 Radiculopathy, lumbosacral region: Secondary | ICD-10-CM

## 2015-11-28 DIAGNOSIS — M47817 Spondylosis without myelopathy or radiculopathy, lumbosacral region: Secondary | ICD-10-CM | POA: Insufficient documentation

## 2015-12-17 DIAGNOSIS — M5136 Other intervertebral disc degeneration, lumbar region: Secondary | ICD-10-CM | POA: Diagnosis not present

## 2015-12-17 DIAGNOSIS — M545 Low back pain: Secondary | ICD-10-CM | POA: Diagnosis not present

## 2016-01-03 DIAGNOSIS — M5416 Radiculopathy, lumbar region: Secondary | ICD-10-CM | POA: Diagnosis not present

## 2016-01-03 DIAGNOSIS — M4806 Spinal stenosis, lumbar region: Secondary | ICD-10-CM | POA: Diagnosis not present

## 2016-01-03 DIAGNOSIS — M5136 Other intervertebral disc degeneration, lumbar region: Secondary | ICD-10-CM | POA: Diagnosis not present

## 2016-01-09 DIAGNOSIS — H401132 Primary open-angle glaucoma, bilateral, moderate stage: Secondary | ICD-10-CM | POA: Diagnosis not present

## 2016-01-10 ENCOUNTER — Other Ambulatory Visit: Payer: Self-pay | Admitting: Family Medicine

## 2016-01-28 ENCOUNTER — Other Ambulatory Visit: Payer: Self-pay | Admitting: Family Medicine

## 2016-01-28 DIAGNOSIS — Z1231 Encounter for screening mammogram for malignant neoplasm of breast: Secondary | ICD-10-CM

## 2016-02-01 ENCOUNTER — Other Ambulatory Visit: Payer: Self-pay | Admitting: Family Medicine

## 2016-02-11 ENCOUNTER — Other Ambulatory Visit: Payer: Self-pay | Admitting: Family Medicine

## 2016-02-11 ENCOUNTER — Ambulatory Visit
Admission: RE | Admit: 2016-02-11 | Discharge: 2016-02-11 | Disposition: A | Discharge status: Home / Self Care | Payer: Medicare Other | Source: Ambulatory Visit | Attending: Family Medicine | Admitting: Family Medicine

## 2016-02-11 DIAGNOSIS — Z1231 Encounter for screening mammogram for malignant neoplasm of breast: Secondary | ICD-10-CM | POA: Diagnosis not present

## 2016-02-12 ENCOUNTER — Other Ambulatory Visit: Payer: Self-pay | Admitting: Certified Nurse Midwife

## 2016-02-12 ENCOUNTER — Other Ambulatory Visit: Payer: Self-pay | Admitting: Family Medicine

## 2016-02-12 DIAGNOSIS — R928 Other abnormal and inconclusive findings on diagnostic imaging of breast: Secondary | ICD-10-CM

## 2016-02-13 ENCOUNTER — Ambulatory Visit (INDEPENDENT_AMBULATORY_CARE_PROVIDER_SITE_OTHER): Payer: Medicare Other | Admitting: Family Medicine

## 2016-02-13 ENCOUNTER — Encounter: Payer: Self-pay | Admitting: Family Medicine

## 2016-02-13 VITALS — BP 122/78 | HR 72 | Temp 98.5°F | Wt 136.0 lb

## 2016-02-13 DIAGNOSIS — R928 Other abnormal and inconclusive findings on diagnostic imaging of breast: Secondary | ICD-10-CM

## 2016-02-13 DIAGNOSIS — I10 Essential (primary) hypertension: Secondary | ICD-10-CM

## 2016-02-13 LAB — BASIC METABOLIC PANEL
BUN: 32 mg/dL — ABNORMAL HIGH (ref 6–23)
CO2: 28 meq/L (ref 19–32)
Calcium: 9.2 mg/dL (ref 8.4–10.5)
Chloride: 105 mEq/L (ref 96–112)
Creatinine, Ser: 0.82 mg/dL (ref 0.40–1.20)
GFR: 72.77 mL/min (ref >60.00)
GLUCOSE: 122 mg/dL — AB (ref 70–99)
POTASSIUM: 3.6 meq/L (ref 3.5–5.1)
SODIUM: 140 meq/L (ref 135–145)

## 2016-02-13 MED ORDER — LISINOPRIL-HYDROCHLOROTHIAZIDE 20-12.5 MG PO TABS: 1.0000 | ORAL_TABLET | Freq: Every day | ORAL | Status: DC

## 2016-02-13 NOTE — Patient Instructions (Addendum)
Start combo pill lisinopril hctz 20/12.5mg  daily.  Labs today.  Monitor blood pressures and let me know if trending up.  Let us know if you don't hear from imaging center to schedule more detailed mammogram/ultrasound.  Return for medicare wellness visit in 3 months.

## 2016-02-13 NOTE — Progress Notes (Signed)
BP 122/78 mmHg  Pulse 72  Temp(Src) 98.5 F (36.9 C) (Oral)  Wt 136 lb (61.689 kg)   CC: med refill  Subjective:    Patient ID: Brandy Young, female    DOB: [birth year], 70 y.o.   MRN: [id number]  HPI: Brandy Young is a 70 y.o. female presenting on 02/13/2016 for Medication Management   Recent screening mammogram - possible R breast mass pending diagnostic mammo/US.   HTN - Compliant with current antihypertensive regimen of lisinporil 20mg  daily and hydrochlorothiazide 25mg  daily. Was taking combo pill which she prefers. Ran out 4d ago. Does check blood pressures at home: intermittently running normal. No low blood pressure readings or symptoms of dizziness/syncope. Denies HA, vision changes, CP/tightness, SOB, leg swelling.   Recent 30d event monitor normal (Gollan). No further syncope, palpitations, chest pain.   Relevant past medical, surgical, family and social history reviewed and updated as indicated. Interim medical history since our last visit reviewed. Allergies and medications reviewed and updated. Current Outpatient Prescriptions on File Prior to Visit  Medication Sig  . aspirin 81 MG tablet Take 81 mg by mouth daily.  . calcium carbonate (OS-CAL) 600 MG TABS tablet Take 600 mg by mouth 2 (two) times daily with a meal.  . cetirizine (ZYRTEC) 10 MG tablet Take 10 mg by mouth daily.  Marland Kitchen. HYDROcodone-acetaminophen (NORCO) 7.5-325 MG per tablet Take 1 tablet by mouth 3 (three) times daily as needed for moderate pain. (Dr Yves Dillhasnis)  . latanoprost (XALATAN) 0.005 % ophthalmic solution Place 1 drop into both eyes at bedtime.  . Omega-3 Fatty Acids (FISH OIL) 1200 MG CAPS Take 1 capsule by mouth 2 (two) times daily.  . simvastatin (ZOCOR) 10 MG tablet TAKE ONE TABLET BY MOUTH ONCE DAILY  . timolol (BETIMOL) 0.5 % ophthalmic solution Place 1 drop into both eyes daily.   No current facility-administered medications on file prior to visit.    Review of  Systems Per HPI unless specifically indicated in ROS section     Objective:    BP 122/78 mmHg  Pulse 72  Temp(Src) 98.5 F (36.9 C) (Oral)  Wt 136 lb (61.689 kg)  Wt Readings from Last 3 Encounters:  02/13/16 136 lb (61.689 kg)  07/09/15 139 lb (63.05 kg)  04/19/15 139 lb 4 oz (63.163 kg)    Physical Exam  Constitutional: She appears well-developed and well-nourished. No distress.  HENT:  Mouth/Throat: Oropharynx is clear and moist. No oropharyngeal exudate.  Cardiovascular: Normal rate, regular rhythm, normal heart sounds and intact distal pulses.   No murmur heard. Pulmonary/Chest: Effort normal and breath sounds normal. No respiratory distress. She has no wheezes. She has no rales.  Musculoskeletal: She exhibits no edema.  Skin: Skin is warm and dry. No rash noted.  Psychiatric: She has a normal mood and affect.  Nursing note and vitals reviewed.  Results for orders placed or performed in visit on 04/16/15  Lipid panel  Result Value Ref Range   Cholesterol 214 (H) 0 - 200 mg/dL   Triglycerides 409.8103.0 0.0 - 149.0 mg/dL   HDL 11.9166.50 >47.82>39.00 mg/dL   VLDL 95.620.6 0.0 - 21.340.0 mg/dL   LDL Cholesterol 086127 (H) 0 - 99 mg/dL   Total CHOL/HDL Ratio 3    NonHDL 147.53   Basic metabolic panel  Result Value Ref Range   Sodium 139 135 - 145 mEq/L   Potassium 3.5 3.5 - 5.1 mEq/L   Chloride 105 96 - 112 mEq/L   CO2 28  19 - 32 mEq/L   Glucose, Bld 100 (H) 70 - 99 mg/dL   BUN 26 (H) 6 - 23 mg/dL   Creatinine, Ser 9.60 0.40 - 1.20 mg/dL   Calcium 9.3 8.4 - 45.4 mg/dL   GFR 098.11 >91.47 mL/min  Hemoglobin A1c  Result Value Ref Range   Hgb A1c MFr Bld 6.1 4.6 - 6.5 %      Assessment & Plan:   Problem List Items Addressed This Visit    HTN (hypertension) - Primary    Chronic, stable. Pt has been taking combo pill - but ran out 4d ago and bp still well controlled today. Will refill combo pill with lower diuretic dose. BMP today.      Relevant Medications    lisinopril-hydrochlorothiazide (ZESTORETIC) 20-12.5 MG tablet   Other Relevant Orders   Basic metabolic panel   Abnormal mammogram    ?R breast mass pending dx mammo/US - pt will let us know if hasn't heard to schedule f/u imaging by end of this week.          Follow up plan: Return in about 3 months (around 05/15/2016), or as needed, for medicare wellness visit.  Eustaquio Boyden, MD

## 2016-02-13 NOTE — Assessment & Plan Note (Signed)
Chronic, stable. Pt has been taking combo pill - but ran out 4d ago and bp still well controlled today. Will refill combo pill with lower diuretic dose. BMP today.

## 2016-02-13 NOTE — Assessment & Plan Note (Signed)
?  R breast mass pending dx mammo/US - pt will let us know if hasn't heard to schedule f/u imaging by end of this week.

## 2016-02-13 NOTE — Progress Notes (Signed)
Pre visit review using our clinic review tool, if applicable. No additional management support is needed unless otherwise documented below in the visit note. 

## 2016-02-24 ENCOUNTER — Ambulatory Visit
Admission: RE | Admit: 2016-02-24 | Discharge: 2016-02-24 | Disposition: A | Discharge status: Home / Self Care | Payer: Medicare Other | Source: Ambulatory Visit | Attending: Family Medicine | Admitting: Family Medicine

## 2016-02-24 DIAGNOSIS — R928 Other abnormal and inconclusive findings on diagnostic imaging of breast: Secondary | ICD-10-CM

## 2016-02-24 DIAGNOSIS — N63 Unspecified lump in breast: Secondary | ICD-10-CM | POA: Diagnosis not present

## 2016-03-05 DIAGNOSIS — M5440 Lumbago with sciatica, unspecified side: Secondary | ICD-10-CM | POA: Diagnosis not present

## 2016-03-05 DIAGNOSIS — M4806 Spinal stenosis, lumbar region: Secondary | ICD-10-CM | POA: Diagnosis not present

## 2016-03-25 ENCOUNTER — Other Ambulatory Visit: Payer: Self-pay | Admitting: Family Medicine

## 2016-04-21 ENCOUNTER — Encounter: Payer: Self-pay | Admitting: Family Medicine

## 2016-04-21 ENCOUNTER — Ambulatory Visit (INDEPENDENT_AMBULATORY_CARE_PROVIDER_SITE_OTHER): Payer: Medicare Other | Admitting: Family Medicine

## 2016-04-21 ENCOUNTER — Ambulatory Visit (INDEPENDENT_AMBULATORY_CARE_PROVIDER_SITE_OTHER)
Admission: RE | Admit: 2016-04-21 | Discharge: 2016-04-21 | Disposition: A | Discharge status: Home / Self Care | Payer: Medicare Other | Source: Ambulatory Visit | Attending: Family Medicine | Admitting: Family Medicine

## 2016-04-21 VITALS — BP 106/64 | HR 89 | Temp 99.0°F | Wt 130.4 lb

## 2016-04-21 DIAGNOSIS — R109 Unspecified abdominal pain: Secondary | ICD-10-CM | POA: Diagnosis not present

## 2016-04-21 DIAGNOSIS — K5909 Other constipation: Secondary | ICD-10-CM | POA: Insufficient documentation

## 2016-04-21 DIAGNOSIS — R1084 Generalized abdominal pain: Secondary | ICD-10-CM

## 2016-04-21 DIAGNOSIS — I10 Essential (primary) hypertension: Secondary | ICD-10-CM

## 2016-04-21 LAB — CBC WITH DIFFERENTIAL/PLATELET
BASOS PCT: 0.3 % (ref 0.0–3.0)
Basophils Absolute: 0 10*3/uL (ref 0.0–0.1)
EOS ABS: 0.1 10*3/uL (ref 0.0–0.7)
Eosinophils Relative: 1.2 % (ref 0.0–5.0)
HCT: 30.2 % — ABNORMAL LOW (ref 36.0–46.0)
Hemoglobin: 10.2 g/dL — ABNORMAL LOW (ref 12.0–15.0)
Lymphocytes Relative: 15.5 % (ref 12.0–46.0)
Lymphs Abs: 1.3 10*3/uL (ref 0.7–4.0)
MCHC: 33.8 g/dL (ref 30.0–36.0)
MCV: 89.8 fl (ref 78.0–100.0)
MONO ABS: 0.6 10*3/uL (ref 0.1–1.0)
Monocytes Relative: 7 % (ref 3.0–12.0)
NEUTROS ABS: 6.4 10*3/uL (ref 1.4–7.7)
Neutrophils Relative %: 76 % (ref 43.0–77.0)
PLATELETS: 317 10*3/uL (ref 150.0–400.0)
RBC: 3.37 Mil/uL — ABNORMAL LOW (ref 3.87–5.11)
RDW: 13.6 % (ref 11.5–15.5)
WBC: 8.4 10*3/uL (ref 4.0–10.5)

## 2016-04-21 LAB — COMPREHENSIVE METABOLIC PANEL
ALT: 12 U/L (ref 0–35)
AST: 21 U/L (ref 0–37)
Albumin: 4.1 g/dL (ref 3.5–5.2)
Alkaline Phosphatase: 55 U/L (ref 39–117)
BILIRUBIN TOTAL: 0.5 mg/dL (ref 0.2–1.2)
BUN: 25 mg/dL — AB (ref 6–23)
CHLORIDE: 103 meq/L (ref 96–112)
CO2: 28 meq/L (ref 19–32)
CREATININE: 0.81 mg/dL (ref 0.40–1.20)
Calcium: 9.7 mg/dL (ref 8.4–10.5)
GFR: 73.77 mL/min (ref >60.00)
GLUCOSE: 111 mg/dL — AB (ref 70–99)
Potassium: 3.8 mEq/L (ref 3.5–5.1)
SODIUM: 139 meq/L (ref 135–145)
Total Protein: 6.9 g/dL (ref 6.0–8.3)

## 2016-04-21 LAB — LIPASE: LIPASE: 19 U/L (ref 11.0–59.0)

## 2016-04-21 MED ORDER — SENNOSIDES-DOCUSATE SODIUM 8.6-50 MG PO TABS: 1.0000 | ORAL_TABLET | Freq: Every evening | ORAL | 0 refills | Status: AC | PRN

## 2016-04-21 MED ORDER — POLYETHYLENE GLYCOL 3350 17 GM/SCOOP PO POWD: 17.0000 g | Freq: Every day | ORAL | 0 refills | Status: DC | PRN

## 2016-04-21 MED ORDER — LISINOPRIL 10 MG PO TABS: 10.0000 mg | ORAL_TABLET | Freq: Every day | ORAL | 6 refills | Status: DC

## 2016-04-21 NOTE — Assessment & Plan Note (Signed)
4 months of abdominal discomfort, bloating, early satiety, constipation and mild weight loss noted.  No signs of obstruction. Check labs and abd US. Check abd xray to eval stool burden.  UTD colon and breast cancer screening.  Start miralax and senna/docusate bowel regimen (in PRN narcotic use)

## 2016-04-21 NOTE — Patient Instructions (Addendum)
Blood pressure running low. Change medicine to plain lisinopril 10mg  daily. New dose at pharmacy. Labs today. Xray today. Pass by our referral coordinator to schedule abdominal ultrasound. Start miralax powder daily and if no better with this, may take senna/docusate stool softener as well.  If no better with above, let me know for further imaging study.

## 2016-04-21 NOTE — Progress Notes (Signed)
BP 106/64 (BP Location: Right Arm, Cuff Size: Normal)   Pulse 89   Temp 99 F (37.2 C)   Wt 130 lb 6.4 oz (59.1 kg)   SpO2 98%   BMI 25.47 kg/m    CC: abd pain Subjective:    Patient ID: Brandy Young, female    DOB: 30-Oct-1945, 70 y.o.   MRN: [id number]  HPI: Brandy Young is a 70 y.o. female presenting on 04/21/2016 for Follow-up; Abdominal Pain; Constipation; and Nausea   4 mo h/o mid abdominal pain with bloating, constipation, nausea, and early satiety. Noticing decreased bowel movements. Appetite comes and goes. Passing flatus ok. Mild GERD.   No fevers/chills, vomiting, blood in stool, UTI sxs. No boring pain to the back. No dysphagia.  Last BM this morning - very small.  COLONOSCOPY  08/2011 diverticulosis, int hem (Iftikhar) ESOPHAGOGASTRODUODENOSCOPY  08/2011 WNL VAGINAL HYSTERECTOMY  1980s prolapsed uterus, ovaries remained   Relevant past medical, surgical, family and social history reviewed and updated as indicated. Interim medical history since our last visit reviewed. Allergies and medications reviewed and updated. Current Outpatient Prescriptions on File Prior to Visit  Medication Sig  . aspirin 81 MG tablet Take 81 mg by mouth daily.  . calcium carbonate (OS-CAL) 600 MG TABS tablet Take 600 mg by mouth 2 (two) times daily with a meal.  . cetirizine (ZYRTEC) 10 MG tablet Take 10 mg by mouth daily.  Marland Kitchen. HYDROcodone-acetaminophen (NORCO) 7.5-325 MG per tablet Take 1 tablet by mouth 3 (three) times daily as needed for moderate pain. (Dr Yves Dillhasnis)  . latanoprost (XALATAN) 0.005 % ophthalmic solution Place 1 drop into both eyes at bedtime.  . Omega-3 Fatty Acids (FISH OIL) 1200 MG CAPS Take 1 capsule by mouth 2 (two) times daily.  . simvastatin (ZOCOR) 10 MG tablet TAKE ONE TABLET BY MOUTH ONCE DAILY  . timolol (BETIMOL) 0.5 % ophthalmic solution Place 1 drop into both eyes daily.   No current facility-administered medications on file prior to visit.      Review of Systems Per HPI unless specifically indicated in ROS section     Objective:    BP 106/64 (BP Location: Right Arm, Cuff Size: Normal)   Pulse 89   Temp 99 F (37.2 C)   Wt 130 lb 6.4 oz (59.1 kg)   SpO2 98%   BMI 25.47 kg/m   Wt Readings from Last 3 Encounters:  04/21/16 130 lb 6.4 oz (59.1 kg)  02/13/16 136 lb (61.7 kg)  07/09/15 139 lb (63 kg)    Physical Exam  Constitutional: She appears well-developed and well-nourished. No distress.  HENT:  Mouth/Throat: Oropharynx is clear and moist. No oropharyngeal exudate.  Cardiovascular: Normal rate, regular rhythm, normal heart sounds and intact distal pulses.   No murmur heard. Pulmonary/Chest: Effort normal and breath sounds normal. No respiratory distress. She has no wheezes. She has no rales.  Abdominal: Soft. Normal appearance and bowel sounds are normal. She exhibits no distension and no mass. There is no hepatosplenomegaly. There is tenderness (diffuse discomfort, RLQ pain). There is no rigidity, no rebound, no guarding, no CVA tenderness and negative Murphy's sign. No hernia.  Musculoskeletal: She exhibits no edema.  Skin: Skin is warm and dry. No rash noted.  Psychiatric: She has a normal mood and affect.  Nursing note and vitals reviewed.  Results for orders placed or performed in visit on 02/13/16  Basic metabolic panel  Result Value Ref Range   Sodium 140 135 - 145  mEq/L   Potassium 3.6 3.5 - 5.1 mEq/L   Chloride 105 96 - 112 mEq/L   CO2 28 19 - 32 mEq/L   Glucose, Bld 122 (H) 70 - 99 mg/dL   BUN 32 (H) 6 - 23 mg/dL   Creatinine, Ser 6.290.82 0.40 - 1.20 mg/dL   Calcium 9.2 8.4 - 52.810.5 mg/dL   GFR 41.3272.77 >44.01>60.00 mL/min      Assessment & Plan:   Problem List Items Addressed This Visit    Generalized abdominal discomfort - Primary    4 months of abdominal discomfort, bloating, early satiety, constipation and mild weight loss noted.  No signs of obstruction. Check labs and abd US. Check abd xray to eval  stool burden.  UTD colon and breast cancer screening.  Start miralax and senna/docusate bowel regimen (in PRN narcotic use)      Relevant Orders   US Abdomen Complete   DG Abd 2 Views   Comprehensive metabolic panel   CBC with Differential/Platelet   Lipase   HTN (hypertension)    bp remaining low despite lower dose antihypertensive. Will change regimen to lisinopril 10mg  daily.      Relevant Medications   lisinopril (PRINIVIL,ZESTRIL) 10 MG tablet    Other Visit Diagnoses   None.      Follow up plan: Return if symptoms worsen or fail to improve.  Eustaquio BoydenJavier Larri Brewton, MD

## 2016-04-21 NOTE — Assessment & Plan Note (Signed)
bp remaining low despite lower dose antihypertensive. Will change regimen to lisinopril 10mg  daily.

## 2016-04-21 NOTE — Progress Notes (Signed)
Pre visit review using our clinic review tool, if applicable. No additional management support is needed unless otherwise documented below in the visit note. 

## 2016-04-23 ENCOUNTER — Ambulatory Visit
Admission: RE | Admit: 2016-04-23 | Discharge: 2016-04-23 | Disposition: A | Discharge status: Home / Self Care | Payer: Medicare Other | Source: Ambulatory Visit | Attending: Family Medicine | Admitting: Family Medicine

## 2016-04-23 DIAGNOSIS — R1084 Generalized abdominal pain: Secondary | ICD-10-CM | POA: Diagnosis not present

## 2016-04-23 DIAGNOSIS — R109 Unspecified abdominal pain: Secondary | ICD-10-CM | POA: Diagnosis not present

## 2016-04-25 ENCOUNTER — Encounter: Payer: Self-pay | Admitting: Family Medicine

## 2016-04-25 DIAGNOSIS — R1084 Generalized abdominal pain: Secondary | ICD-10-CM

## 2016-04-25 DIAGNOSIS — K5900 Constipation, unspecified: Secondary | ICD-10-CM

## 2016-04-25 DIAGNOSIS — D649 Anemia, unspecified: Secondary | ICD-10-CM

## 2016-04-26 NOTE — Telephone Encounter (Signed)
Ordered CT scan

## 2016-04-28 NOTE — Telephone Encounter (Signed)
Cancelled CT scan.

## 2016-05-06 DIAGNOSIS — M4806 Spinal stenosis, lumbar region: Secondary | ICD-10-CM | POA: Diagnosis not present

## 2016-05-06 DIAGNOSIS — M5416 Radiculopathy, lumbar region: Secondary | ICD-10-CM | POA: Diagnosis not present

## 2016-05-06 DIAGNOSIS — M5136 Other intervertebral disc degeneration, lumbar region: Secondary | ICD-10-CM | POA: Diagnosis not present

## 2016-05-20 ENCOUNTER — Telehealth: Payer: Self-pay | Admitting: Family Medicine

## 2016-05-20 DIAGNOSIS — R928 Other abnormal and inconclusive findings on diagnostic imaging of breast: Secondary | ICD-10-CM

## 2016-05-20 NOTE — Telephone Encounter (Signed)
Pt called [facility].  She was told that she would need another one in 6 months which would be in mid December.  Please contact her to discuss

## 2016-05-21 NOTE — Telephone Encounter (Signed)
Pt aware order is in system She will make her own appointment

## 2016-05-21 NOTE — Telephone Encounter (Signed)
R breast US ordered

## 2016-05-23 ENCOUNTER — Encounter: Payer: Self-pay | Admitting: Family Medicine

## 2016-07-09 DIAGNOSIS — H401132 Primary open-angle glaucoma, bilateral, moderate stage: Secondary | ICD-10-CM | POA: Diagnosis not present

## 2016-07-16 DIAGNOSIS — H401132 Primary open-angle glaucoma, bilateral, moderate stage: Secondary | ICD-10-CM | POA: Diagnosis not present

## 2016-07-26 ENCOUNTER — Other Ambulatory Visit: Payer: Self-pay | Admitting: Family Medicine

## 2016-07-26 DIAGNOSIS — R7303 Prediabetes: Secondary | ICD-10-CM

## 2016-07-26 DIAGNOSIS — I10 Essential (primary) hypertension: Secondary | ICD-10-CM

## 2016-07-26 DIAGNOSIS — D509 Iron deficiency anemia, unspecified: Secondary | ICD-10-CM

## 2016-07-26 DIAGNOSIS — E785 Hyperlipidemia, unspecified: Secondary | ICD-10-CM

## 2016-07-26 DIAGNOSIS — Z1159 Encounter for screening for other viral diseases: Secondary | ICD-10-CM

## 2016-07-27 ENCOUNTER — Other Ambulatory Visit (INDEPENDENT_AMBULATORY_CARE_PROVIDER_SITE_OTHER): Payer: Medicare Other

## 2016-07-27 DIAGNOSIS — R7303 Prediabetes: Secondary | ICD-10-CM | POA: Diagnosis not present

## 2016-07-27 DIAGNOSIS — D509 Iron deficiency anemia, unspecified: Secondary | ICD-10-CM | POA: Diagnosis not present

## 2016-07-27 DIAGNOSIS — E785 Hyperlipidemia, unspecified: Secondary | ICD-10-CM

## 2016-07-27 DIAGNOSIS — I10 Essential (primary) hypertension: Secondary | ICD-10-CM

## 2016-07-27 DIAGNOSIS — Z1159 Encounter for screening for other viral diseases: Secondary | ICD-10-CM | POA: Diagnosis not present

## 2016-07-27 LAB — LIPID PANEL
CHOL/HDL RATIO: 3
Cholesterol: 228 mg/dL — ABNORMAL HIGH (ref 0–200)
HDL: 79.5 mg/dL (ref >39.00)
LDL Cholesterol: 121 mg/dL — ABNORMAL HIGH (ref 0–99)
NONHDL: 148.04
TRIGLYCERIDES: 136 mg/dL (ref 0.0–149.0)
VLDL: 27.2 mg/dL (ref 0.0–40.0)

## 2016-07-27 LAB — BASIC METABOLIC PANEL
BUN: 21 mg/dL (ref 6–23)
CALCIUM: 9.8 mg/dL (ref 8.4–10.5)
CO2: 25 meq/L (ref 19–32)
CREATININE: 0.86 mg/dL (ref 0.40–1.20)
Chloride: 106 mEq/L (ref 96–112)
GFR: 68.79 mL/min (ref >60.00)
GLUCOSE: 93 mg/dL (ref 70–99)
Potassium: 4.3 mEq/L (ref 3.5–5.1)
Sodium: 140 mEq/L (ref 135–145)

## 2016-07-27 LAB — IBC PANEL
Iron: 80 ug/dL (ref 42–145)
Saturation Ratios: 16.6 % — ABNORMAL LOW (ref 20.0–50.0)
Transferrin: 344 mg/dL (ref 212.0–360.0)

## 2016-07-27 LAB — CBC WITH DIFFERENTIAL/PLATELET
BASOS ABS: 0 10*3/uL (ref 0.0–0.1)
BASOS PCT: 0.5 % (ref 0.0–3.0)
EOS ABS: 0.3 10*3/uL (ref 0.0–0.7)
Eosinophils Relative: 5 % (ref 0.0–5.0)
HEMATOCRIT: 29.6 % — AB (ref 36.0–46.0)
Hemoglobin: 9.8 g/dL — ABNORMAL LOW (ref 12.0–15.0)
LYMPHS ABS: 1.6 10*3/uL (ref 0.7–4.0)
LYMPHS PCT: 24.7 % (ref 12.0–46.0)
MCHC: 33.1 g/dL (ref 30.0–36.0)
MCV: 86.2 fl (ref 78.0–100.0)
Monocytes Absolute: 0.6 10*3/uL (ref 0.1–1.0)
Monocytes Relative: 9.6 % (ref 3.0–12.0)
NEUTROS ABS: 3.9 10*3/uL (ref 1.4–7.7)
NEUTROS PCT: 60.2 % (ref 43.0–77.0)
PLATELETS: 310 10*3/uL (ref 150.0–400.0)
RBC: 3.44 Mil/uL — ABNORMAL LOW (ref 3.87–5.11)
RDW: 14.3 % (ref 11.5–15.5)
WBC: 6.5 10*3/uL (ref 4.0–10.5)

## 2016-07-27 LAB — FOLATE: FOLATE: 20.9 ng/mL (ref >5.9)

## 2016-07-27 LAB — HEMOGLOBIN A1C: Hgb A1c MFr Bld: 6.1 % (ref 4.6–6.5)

## 2016-07-27 LAB — FERRITIN: Ferritin: 12.2 ng/mL (ref 10.0–291.0)

## 2016-07-27 LAB — VITAMIN B12: Vitamin B-12: 273 pg/mL (ref 211–911)

## 2016-07-28 LAB — HEPATITIS C ANTIBODY: HCV AB: NEGATIVE

## 2016-07-31 ENCOUNTER — Encounter: Payer: Self-pay | Admitting: Family Medicine

## 2016-07-31 ENCOUNTER — Ambulatory Visit (INDEPENDENT_AMBULATORY_CARE_PROVIDER_SITE_OTHER): Payer: Medicare Other | Admitting: Family Medicine

## 2016-07-31 VITALS — BP 128/80 | HR 78 | Temp 98.5°F | Ht 59.0 in | Wt 139.0 lb

## 2016-07-31 DIAGNOSIS — Z0000 Encounter for general adult medical examination without abnormal findings: Secondary | ICD-10-CM

## 2016-07-31 DIAGNOSIS — Z7189 Other specified counseling: Secondary | ICD-10-CM

## 2016-07-31 DIAGNOSIS — E538 Deficiency of other specified B group vitamins: Secondary | ICD-10-CM | POA: Diagnosis not present

## 2016-07-31 DIAGNOSIS — R7303 Prediabetes: Secondary | ICD-10-CM

## 2016-07-31 DIAGNOSIS — I10 Essential (primary) hypertension: Secondary | ICD-10-CM

## 2016-07-31 DIAGNOSIS — M5136 Other intervertebral disc degeneration, lumbar region: Secondary | ICD-10-CM

## 2016-07-31 DIAGNOSIS — M48062 Spinal stenosis, lumbar region with neurogenic claudication: Secondary | ICD-10-CM

## 2016-07-31 DIAGNOSIS — E785 Hyperlipidemia, unspecified: Secondary | ICD-10-CM

## 2016-07-31 DIAGNOSIS — D509 Iron deficiency anemia, unspecified: Secondary | ICD-10-CM

## 2016-07-31 MED ORDER — CYANOCOBALAMIN 1000 MCG/ML IJ SOLN
1000.0000 ug | Freq: Once | INTRAMUSCULAR | Status: AC
  Administered 2016-07-31: 1000 ug via INTRAMUSCULAR

## 2016-07-31 MED ORDER — VITAMIN B-12 1000 MCG PO TABS: 1000.0000 ug | ORAL_TABLET | Freq: Every day | ORAL | Status: DC

## 2016-07-31 NOTE — Assessment & Plan Note (Signed)
b12 shot today, then start daily

## 2016-07-31 NOTE — Assessment & Plan Note (Signed)
Chronic, stable. Continue current regimen. 

## 2016-07-31 NOTE — Assessment & Plan Note (Signed)
Preventative protocols reviewed and updated unless pt declined. Discussed healthy diet and lifestyle.  

## 2016-07-31 NOTE — Assessment & Plan Note (Signed)

## 2016-07-31 NOTE — Assessment & Plan Note (Signed)
See above

## 2016-07-31 NOTE — Patient Instructions (Addendum)
Sign release for records of bone density scan Work on advanced directive form. B12 shot today then start 1030mg daily Iron rich handout provided today.  Return in 6 months for follow up.   Health Maintenance, Female Introduction Adopting a healthy lifestyle and getting preventive care can go a long way to promote health and wellness. Talk with your health care provider about what schedule of regular examinations is right for you. This is a good chance for you to check in with your provider about disease prevention and staying healthy. In between checkups, there are plenty of things you can do on your own. Experts have done a lot of research about which lifestyle changes and preventive measures are most likely to keep you healthy. Ask your health care provider for more information. Weight and diet Eat a healthy diet  Be sure to include plenty of vegetables, fruits, low-fat dairy products, and lean protein.  Do not eat a lot of foods high in solid fats, added sugars, or salt.  Get regular exercise. This is one of the most important things you can do for your health.  Most adults should exercise for at least 150 minutes each week. The exercise should increase your heart rate and make you sweat (moderate-intensity exercise).  Most adults should also do strengthening exercises at least twice a week. This is in addition to the moderate-intensity exercise. Maintain a healthy weight  Body mass index (BMI) is a measurement that can be used to identify possible weight problems. It estimates body fat based on height and weight. Your health care provider can help determine your BMI and help you achieve or maintain a healthy weight.  For females 240years of age and older:  A BMI below 18.5 is considered underweight.  A BMI of 18.5 to 24.9 is normal.  A BMI of 25 to 29.9 is considered overweight.  A BMI of 30 and above is considered obese. Watch levels of cholesterol and blood lipids  You  should start having your blood tested for lipids and cholesterol at 72years of age, then have this test every 5 years.  You may need to have your cholesterol levels checked more often if:  Your lipid or cholesterol levels are high.  You are older than 72years of age.  You are at high risk for heart disease. Cancer screening Lung Cancer  Lung cancer screening is recommended for adults 59876years old who are at high risk for lung cancer because of a history of smoking.  A yearly low-dose CT scan of the lungs is recommended for people who:  Currently smoke.  Have quit within the past 15 years.  Have at least a 30-pack-year history of smoking. A pack year is smoking an average of one pack of cigarettes a day for 1 year.  Yearly screening should continue until it has been 15 years since you quit.  Yearly screening should stop if you develop a health problem that would prevent you from having lung cancer treatment. Breast Cancer  Practice breast self-awareness. This means understanding how your breasts normally appear and feel.  It also means doing regular breast self-exams. Let your health care provider know about any changes, no matter how small.  If you are in your 20s or 30s, you should have a clinical breast exam (CBE) by a health care provider every 1-3 years as part of a regular health exam.  If you are 452or older, have a CBE every year. Also consider having  a breast X-ray (mammogram) every year.  If you have a family history of breast cancer, talk to your health care provider about genetic screening.  If you are at high risk for breast cancer, talk to your health care provider about having an MRI and a mammogram every year.  Breast cancer gene (BRCA) assessment is recommended for women who have family members with BRCA-related cancers. BRCA-related cancers include:  Breast.  Ovarian.  Tubal.  Peritoneal cancers.  Results of the assessment will determine the need  for genetic counseling and BRCA1 and BRCA2 testing. Cervical Cancer  Your health care provider may recommend that you be screened regularly for cancer of the pelvic organs (ovaries, uterus, and vagina). This screening involves a pelvic examination, including checking for microscopic changes to the surface of your cervix (Pap test). You may be encouraged to have this screening done every 3 years, beginning at age 55.  For women ages 71-65, health care providers may recommend pelvic exams and Pap testing every 3 years, or they may recommend the Pap and pelvic exam, combined with testing for human papilloma virus (HPV), every 5 years. Some types of HPV increase your risk of cervical cancer. Testing for HPV may also be done on women of any age with unclear Pap test results.  Other health care providers may not recommend any screening for nonpregnant women who are considered low risk for pelvic cancer and who do not have symptoms. Ask your health care provider if a screening pelvic exam is right for you.  If you have had past treatment for cervical cancer or a condition that could lead to cancer, you need Pap tests and screening for cancer for at least 20 years after your treatment. If Pap tests have been discontinued, your risk factors (such as having a new sexual partner) need to be reassessed to determine if screening should resume. Some women have medical problems that increase the chance of getting cervical cancer. In these cases, your health care provider may recommend more frequent screening and Pap tests. Colorectal Cancer  This type of cancer can be detected and often prevented.  Routine colorectal cancer screening usually begins at 72 years of age and continues through 72 years of age.  Your health care provider may recommend screening at an earlier age if you have risk factors for colon cancer.  Your health care provider may also recommend using home test kits to check for hidden blood in the  stool.  A small camera at the end of a tube can be used to examine your colon directly (sigmoidoscopy or colonoscopy). This is done to check for the earliest forms of colorectal cancer.  Routine screening usually begins at age 12.  Direct examination of the colon should be repeated every 5-10 years through 72 years of age. However, you may need to be screened more often if early forms of precancerous polyps or small growths are found. Skin Cancer  Check your skin from head to toe regularly.  Tell your health care provider about any new moles or changes in moles, especially if there is a change in a mole's shape or color.  Also tell your health care provider if you have a mole that is larger than the size of a pencil eraser.  Always use sunscreen. Apply sunscreen liberally and repeatedly throughout the day.  Protect yourself by wearing long sleeves, pants, a wide-brimmed hat, and sunglasses whenever you are outside. Heart disease, diabetes, and high blood pressure  High blood pressure  causes heart disease and increases the risk of stroke. High blood pressure is more likely to develop in:  People who have blood pressure in the high end of the normal range (130-139/85-89 mm Hg).  People who are overweight or obese.  People who are African American.  If you are 34-38 years of age, have your blood pressure checked every 3-5 years. If you are 66 years of age or older, have your blood pressure checked every year. You should have your blood pressure measured twice-once when you are at a hospital or clinic, and once when you are not at a hospital or clinic. Record the average of the two measurements. To check your blood pressure when you are not at a hospital or clinic, you can use:  An automated blood pressure machine at a pharmacy.  A home blood pressure monitor.  If you are between 60 years and 94 years old, ask your health care provider if you should take aspirin to prevent  strokes.  Have regular diabetes screenings. This involves taking a blood sample to check your fasting blood sugar level.  If you are at a normal weight and have a low risk for diabetes, have this test once every three years after 72 years of age.  If you are overweight and have a high risk for diabetes, consider being tested at a younger age or more often. Preventing infection Hepatitis B  If you have a higher risk for hepatitis B, you should be screened for this virus. You are considered at high risk for hepatitis B if:  You were born in a country where hepatitis B is common. Ask your health care provider which countries are considered high risk.  Your parents were born in a high-risk country, and you have not been immunized against hepatitis B (hepatitis B vaccine).  You have HIV or AIDS.  You use needles to inject street drugs.  You live with someone who has hepatitis B.  You have had sex with someone who has hepatitis B.  You get hemodialysis treatment.  You take certain medicines for conditions, including cancer, organ transplantation, and autoimmune conditions. Hepatitis C  Blood testing is recommended for:  Everyone born from [birth year] through 1967.  Anyone with known risk factors for hepatitis C. Sexually transmitted infections (STIs)  You should be screened for sexually transmitted infections (STIs) including gonorrhea and chlamydia if:  You are sexually active and are younger than 72 years of age.  You are older than 72 years of age and your health care provider tells you that you are at risk for this type of infection.  Your sexual activity has changed since you were last screened and you are at an increased risk for chlamydia or gonorrhea. Ask your health care provider if you are at risk.  If you do not have HIV, but are at risk, it may be recommended that you take a prescription medicine daily to prevent HIV infection. This is called pre-exposure prophylaxis  (PrEP). You are considered at risk if:  You are sexually active and do not regularly use condoms or know the HIV status of your partner(s).  You take drugs by injection.  You are sexually active with a partner who has HIV. Talk with your health care provider about whether you are at high risk of being infected with HIV. If you choose to begin PrEP, you should first be tested for HIV. You should then be tested every 3 months for as long as you are  taking PrEP. Pregnancy  If you are premenopausal and you may become pregnant, ask your health care provider about preconception counseling.  If you may become pregnant, take 400 to 800 micrograms (mcg) of folic acid every day.  If you want to prevent pregnancy, talk to your health care provider about birth control (contraception). Osteoporosis and menopause  Osteoporosis is a disease in which the bones lose minerals and strength with aging. This can result in serious bone fractures. Your risk for osteoporosis can be identified using a bone density scan.  If you are 52 years of age or older, or if you are at risk for osteoporosis and fractures, ask your health care provider if you should be screened.  Ask your health care provider whether you should take a calcium or vitamin D supplement to lower your risk for osteoporosis.  Menopause may have certain physical symptoms and risks.  Hormone replacement therapy may reduce some of these symptoms and risks. Talk to your health care provider about whether hormone replacement therapy is right for you. Follow these instructions at home:  Schedule regular health, dental, and eye exams.  Stay current with your immunizations.  Do not use any tobacco products including cigarettes, chewing tobacco, or electronic cigarettes.  If you are pregnant, do not drink alcohol.  If you are breastfeeding, limit how much and how often you drink alcohol.  Limit alcohol intake to no more than 1 drink per day for  nonpregnant women. One drink equals 12 ounces of beer, 5 ounces of wine, or 1 ounces of hard liquor.  Do not use street drugs.  Do not share needles.  Ask your health care provider for help if you need support or information about quitting drugs.  Tell your health care provider if you often feel depressed.  Tell your health care provider if you have ever been abused or do not feel safe at home. This information is not intended to replace advice given to you by your health care provider. Make sure you discuss any questions you have with your health care provider. Document Released: 03/02/2011 Document Revised: 01/23/2016 Document Reviewed: 05/21/2015  2017 [employer]

## 2016-07-31 NOTE — Progress Notes (Signed)
BP 128/80 (BP Location: Left Arm, Patient Position: Sitting, Cuff Size: Normal)   Pulse 78   Temp 98.5 F (36.9 C) (Oral)   Ht 4\' 11"  (1.499 m)   Wt 139 lb (63 kg)   SpO2 97%   BMI 28.07 kg/m    CC: CPE Subjective:    Patient ID: Brandy Young, female    DOB: 1946-01-18, 71 y.o.   MRN: [id number]  HPI: Brandy Young is a 70 y.o. female presenting on 07/31/2016 for Medicare Wellness and Back Pain (Pain has radiated from the top of her neck to her lower back and into her legs. More on the left side)   Chronic back pain with L radiculopathy. Acutely worse over last 2 days. Taking hydrocodone TID over the past week which helps back but not leg so much. Better pain when leaning on something when walking. Has f/u with Dr Yves Dill next week. Continues working part time. Known severe L5/S1 L foraminal stenosis, L4/5  severe central stenosis, and L4/5 and L3/4 severe R and L foraminal stenosis.   Vision screen with eye doctor. Denies depression. 2+ falls in last year.   Preventative: COLONOSCOPY Date: 08/2011 diverticulosis, int hem ([facility]) Well woman exam - s/p hysterectomy, ovaries remain. Aged out. Mammogram - 01/2016 - R breast mass, rec rpt 6 mo. DEXA in [place] >7 yrs ago - normal. No records available.  Flu shot - yearly Tdap 2014 Prevnar 07/2014. Pneumovax 2017.  zostavax 2010.  Advanced directives: has not set up but has discussed with children. HCPOA - children. Packet provided today. Seat belt use discussed Sunscreen use discussed. No changing moles on skin.  Non smoker  Alcohol - none  Lives alone, 1 dog. Widow - heart issues Children nearby Occupation: part time Engineer, [employer]: HS Activity: stays active with family and church Diet: good water, fruits/vegetables   Relevant past medical, surgical, family and social history reviewed and updated as indicated. Interim medical history since our last visit reviewed. Allergies and medications  reviewed and updated. Current Outpatient Prescriptions on File Prior to Visit  Medication Sig  . aspirin 81 MG tablet Take 81 mg by mouth daily.  . calcium carbonate (OS-CAL) 600 MG TABS tablet Take 600 mg by mouth 2 (two) times daily with a meal.  . cetirizine (ZYRTEC) 10 MG tablet Take 10 mg by mouth daily.  Marland Kitchen HYDROcodone-acetaminophen (NORCO) 7.5-325 MG per tablet Take 1 tablet by mouth 3 (three) times daily as needed for moderate pain. (Dr Yves Dill)  . lisinopril (PRINIVIL,ZESTRIL) 10 MG tablet Take 1 tablet (10 mg total) by mouth daily.  . Omega-3 Fatty Acids (FISH OIL) 1200 MG CAPS Take 1 capsule by mouth 2 (two) times daily.  . promethazine (PHENERGAN) 25 MG tablet Take 25 mg by mouth 3 (three) times daily as needed.  . senna-docusate (SENOKOT-S) 8.6-50 MG tablet Take 1 tablet by mouth at bedtime as needed for mild constipation.  . simvastatin (ZOCOR) 10 MG tablet TAKE ONE TABLET BY MOUTH ONCE DAILY   No current facility-administered medications on file prior to visit.     Review of Systems  Constitutional: Negative for activity change, appetite change, chills, fatigue, fever and unexpected weight change.  HENT: Negative for hearing loss.   Eyes: Negative for visual disturbance.  Respiratory: Negative for cough, chest tightness, shortness of breath and wheezing.   Cardiovascular: Positive for leg swelling (chronic left sided). Negative for chest pain and palpitations.  Gastrointestinal: Negative for abdominal distention, abdominal pain, blood  in stool, constipation, diarrhea, nausea and vomiting.  Genitourinary: Negative for difficulty urinating and hematuria.  Musculoskeletal: Positive for arthralgias and back pain. Negative for myalgias and neck pain.  Skin: Negative for rash.  Neurological: Negative for dizziness, seizures, syncope and headaches.  Hematological: Negative for adenopathy. Does not bruise/bleed easily.  Psychiatric/Behavioral: Negative for dysphoric mood. The  patient is not nervous/anxious.    Per HPI unless specifically indicated in ROS section     Objective:    BP 128/80 (BP Location: Left Arm, Patient Position: Sitting, Cuff Size: Normal)   Pulse 78   Temp 98.5 F (36.9 C) (Oral)   Ht 4\' 11"  (1.499 m)   Wt 139 lb (63 kg)   SpO2 97%   BMI 28.07 kg/m   Wt Readings from Last 3 Encounters:  07/31/16 139 lb (63 kg)  04/21/16 130 lb 6.4 oz (59.1 kg)  02/13/16 136 lb (61.7 kg)    Physical Exam  Constitutional: She is oriented to person, place, and time. She appears well-developed and well-nourished. No distress.  HENT:  Head: Normocephalic and atraumatic.  Right Ear: Hearing, tympanic membrane, external ear and ear canal normal.  Left Ear: Hearing, tympanic membrane, external ear and ear canal normal.  Nose: Nose normal.  Mouth/Throat: Uvula is midline, oropharynx is clear and moist and mucous membranes are normal. No oropharyngeal exudate, posterior oropharyngeal edema or posterior oropharyngeal erythema.  Eyes: Conjunctivae and EOM are normal. Pupils are equal, round, and reactive to light. No scleral icterus.  Neck: Normal range of motion. Neck supple. Carotid bruit is not present. No thyromegaly present.  Cardiovascular: Normal rate, regular rhythm, normal heart sounds and intact distal pulses.   No murmur heard. Pulses:      Radial pulses are 2+ on the right side, and 2+ on the left side.  Pulmonary/Chest: Effort normal and breath sounds normal. No respiratory distress. She has no wheezes. She has no rales.  Abdominal: Soft. Bowel sounds are normal. She exhibits no distension and no mass. There is no tenderness. There is no rebound and no guarding.  Musculoskeletal: Normal range of motion. She exhibits edema (L sided).  Lymphadenopathy:    She has no cervical adenopathy.  Neurological: She is alert and oriented to person, place, and time.  CN grossly intact, station and gait intact Recall 2/3, 3/3 with cue calclulation 5/5  serial 3s  Skin: Skin is warm and dry. No rash noted.  Psychiatric: She has a normal mood and affect. Her behavior is normal. Judgment and thought content normal.  Nursing note and vitals reviewed.  Results for orders placed or performed in visit on 07/27/16  Lipid panel  Result Value Ref Range   Cholesterol 228 (H) 0 - 200 mg/dL   Triglycerides 086.5136.0 0.0 - 149.0 mg/dL   HDL 78.4679.50 >96.29>39.00 mg/dL   VLDL 52.827.2 0.0 - 41.340.0 mg/dL   LDL Cholesterol 244121 (H) 0 - 99 mg/dL   Total CHOL/HDL Ratio 3    NonHDL 148.04   Hemoglobin A1c  Result Value Ref Range   Hgb A1c MFr Bld 6.1 4.6 - 6.5 %  CBC with Differential/Platelet  Result Value Ref Range   WBC 6.5 4.0 - 10.5 K/uL   RBC 3.44 (L) 3.87 - 5.11 Mil/uL   Hemoglobin 9.8 (L) 12.0 - 15.0 g/dL   HCT 01.029.6 (L) 27.236.0 - 53.646.0 %   MCV 86.2 78.0 - 100.0 fl   MCHC 33.1 30.0 - 36.0 g/dL   RDW 64.414.3 03.411.5 - 74.215.5 %  Platelets 310.0 150.0 - 400.0 K/uL   Neutrophils Relative % 60.2 43.0 - 77.0 %   Lymphocytes Relative 24.7 12.0 - 46.0 %   Monocytes Relative 9.6 3.0 - 12.0 %   Eosinophils Relative 5.0 0.0 - 5.0 %   Basophils Relative 0.5 0.0 - 3.0 %   Neutro Abs 3.9 1.4 - 7.7 K/uL   Lymphs Abs 1.6 0.7 - 4.0 K/uL   Monocytes Absolute 0.6 0.1 - 1.0 K/uL   Eosinophils Absolute 0.3 0.0 - 0.7 K/uL   Basophils Absolute 0.0 0.0 - 0.1 K/uL  Hepatitis C antibody  Result Value Ref Range   HCV Ab NEGATIVE NEGATIVE  Basic metabolic panel  Result Value Ref Range   Sodium 140 135 - 145 mEq/L   Potassium 4.3 3.5 - 5.1 mEq/L   Chloride 106 96 - 112 mEq/L   CO2 25 19 - 32 mEq/L   Glucose, Bld 93 70 - 99 mg/dL   BUN 21 6 - 23 mg/dL   Creatinine, Ser 9.60 0.40 - 1.20 mg/dL   Calcium 9.8 8.4 - 45.4 mg/dL   GFR 09.81 >19.14 mL/min  Vitamin B12  Result Value Ref Range   Vitamin B-12 273 211 - 911 pg/mL  Ferritin  Result Value Ref Range   Ferritin 12.2 10.0 - 291.0 ng/mL  IBC panel  Result Value Ref Range   Iron 80 42 - 145 ug/dL   Transferrin 782.9 562.1 -  360.0 mg/dL   Saturation Ratios 30.8 (L) 20.0 - 50.0 %  Folate  Result Value Ref Range   Folate 20.9 >5.9 ng/mL      Assessment & Plan:   Problem List Items Addressed This Visit    Advanced care planning/counseling discussion    Advanced directives: has not set up but has discussed with children. HCPOA - children. Packet provided today.      B12 deficiency    b12 shot today, then start daily      DDD (degenerative disc disease), lumbar    Acute worsening back and L leg pain recently. Good circulation. Discussed etiology of pain. Pain controlled with hydrocodone - has f/u with PMR next week.       Relevant Medications   ibuprofen (ADVIL,MOTRIN) 200 MG tablet   Health maintenance examination    Preventative protocols reviewed and updated unless pt declined. Discussed healthy diet and lifestyle.       HLD (hyperlipidemia)    Chronic, stable. Continue current regimen.      HTN (hypertension)    Chronic, stable. Continue current regimen.      IDA (iron deficiency anemia)    Ongoing anemia with low B12 and low iron stores - will give B12 shot today then start oral b12 and increase iron rich foods, reassess in 6 months.      Relevant Medications   vitamin B-12 (CYANOCOBALAMIN) 1000 MCG tablet   Lumbar stenosis with neurogenic claudication    See above.      Medicare annual wellness visit, subsequent - Primary    I have personally reviewed the Medicare Annual Wellness questionnaire and have noted 1. The patient's medical and social history 2. Their use of alcohol, tobacco or illicit drugs 3. Their current medications and supplements 4. The patient's functional ability including ADL's, fall risks, home safety risks and hearing or visual impairment. Cognitive function has been assessed and addressed as indicated.  5. Diet and physical activity 6. Evidence for depression or mood disorders The patients weight, height, BMI have been  recorded in the chart. I have  made referrals, counseling and provided education to the patient based on review of the above and I have provided the pt with a written personalized care plan for preventive services. Provider list updated.. See scanned questionairre as needed for further documentation. Reviewed preventative protocols and updated unless pt declined.       Prediabetes    Reviewed A1c with patient.          Follow up plan: Return in about 6 months (around 01/29/2017) for follow up visit.  Eustaquio BoydenJavier Maven Varelas, MD

## 2016-07-31 NOTE — Assessment & Plan Note (Signed)
Acute worsening back and L leg pain recently. Good circulation. Discussed etiology of pain. Pain controlled with hydrocodone - has f/u with PMR next week.

## 2016-07-31 NOTE — Addendum Note (Signed)
Addended by: Eual FinesBRIDGES, SHANNON P on: 07/31/2016 04:16 PM   Modules accepted: Orders

## 2016-07-31 NOTE — Assessment & Plan Note (Signed)
Reviewed A1c with patient.  

## 2016-07-31 NOTE — Assessment & Plan Note (Signed)
Advanced directives: has not set up but has discussed with children. HCPOA - children. Packet provided today. 

## 2016-07-31 NOTE — Progress Notes (Signed)
Pre visit review using our clinic review tool, if applicable. No additional management support is needed unless otherwise documented below in the visit note. 

## 2016-07-31 NOTE — Assessment & Plan Note (Signed)
Ongoing anemia with low B12 and low iron stores - will give B12 shot today then start oral b12 and increase iron rich foods, reassess in 6 months.

## 2016-08-05 DIAGNOSIS — M48062 Spinal stenosis, lumbar region with neurogenic claudication: Secondary | ICD-10-CM | POA: Diagnosis not present

## 2016-08-05 DIAGNOSIS — M5136 Other intervertebral disc degeneration, lumbar region: Secondary | ICD-10-CM | POA: Diagnosis not present

## 2016-08-05 DIAGNOSIS — M5416 Radiculopathy, lumbar region: Secondary | ICD-10-CM | POA: Diagnosis not present

## 2016-09-03 ENCOUNTER — Ambulatory Visit
Admission: RE | Admit: 2016-09-03 | Discharge: 2016-09-03 | Disposition: A | Discharge status: Home / Self Care | Payer: Medicare Other | Source: Ambulatory Visit | Attending: Family Medicine | Admitting: Family Medicine

## 2016-09-03 ENCOUNTER — Other Ambulatory Visit: Payer: Self-pay | Admitting: Family Medicine

## 2016-09-03 DIAGNOSIS — R928 Other abnormal and inconclusive findings on diagnostic imaging of breast: Secondary | ICD-10-CM

## 2016-09-03 DIAGNOSIS — N6310 Unspecified lump in the right breast, unspecified quadrant: Secondary | ICD-10-CM | POA: Diagnosis not present

## 2016-09-03 DIAGNOSIS — M5416 Radiculopathy, lumbar region: Secondary | ICD-10-CM | POA: Diagnosis not present

## 2016-09-03 DIAGNOSIS — M48062 Spinal stenosis, lumbar region with neurogenic claudication: Secondary | ICD-10-CM | POA: Diagnosis not present

## 2016-09-03 DIAGNOSIS — M5136 Other intervertebral disc degeneration, lumbar region: Secondary | ICD-10-CM | POA: Diagnosis not present

## 2016-09-08 DIAGNOSIS — H401132 Primary open-angle glaucoma, bilateral, moderate stage: Secondary | ICD-10-CM | POA: Diagnosis not present

## 2016-10-01 DIAGNOSIS — M48062 Spinal stenosis, lumbar region with neurogenic claudication: Secondary | ICD-10-CM | POA: Diagnosis not present

## 2016-10-01 DIAGNOSIS — M5416 Radiculopathy, lumbar region: Secondary | ICD-10-CM | POA: Diagnosis not present

## 2016-10-01 DIAGNOSIS — M5136 Other intervertebral disc degeneration, lumbar region: Secondary | ICD-10-CM | POA: Diagnosis not present

## 2016-10-11 ENCOUNTER — Other Ambulatory Visit: Payer: Self-pay | Admitting: Family Medicine

## 2016-11-04 ENCOUNTER — Other Ambulatory Visit: Payer: Self-pay | Admitting: *Deleted

## 2016-11-04 MED ORDER — LISINOPRIL 10 MG PO TABS: 10.0000 mg | ORAL_TABLET | Freq: Every day | ORAL | 1 refills | Status: DC

## 2016-11-20 DIAGNOSIS — M5136 Other intervertebral disc degeneration, lumbar region: Secondary | ICD-10-CM | POA: Diagnosis not present

## 2016-11-20 DIAGNOSIS — M48062 Spinal stenosis, lumbar region with neurogenic claudication: Secondary | ICD-10-CM | POA: Diagnosis not present

## 2016-11-20 DIAGNOSIS — M5416 Radiculopathy, lumbar region: Secondary | ICD-10-CM | POA: Diagnosis not present

## 2017-01-15 DIAGNOSIS — H401132 Primary open-angle glaucoma, bilateral, moderate stage: Secondary | ICD-10-CM | POA: Diagnosis not present

## 2017-01-29 ENCOUNTER — Encounter: Payer: Self-pay | Admitting: Family Medicine

## 2017-01-29 ENCOUNTER — Ambulatory Visit (INDEPENDENT_AMBULATORY_CARE_PROVIDER_SITE_OTHER): Payer: Medicare Other | Admitting: Family Medicine

## 2017-01-29 VITALS — BP 140/84 | HR 80 | Temp 98.5°F | Wt 135.0 lb

## 2017-01-29 DIAGNOSIS — E538 Deficiency of other specified B group vitamins: Secondary | ICD-10-CM

## 2017-01-29 DIAGNOSIS — M7989 Other specified soft tissue disorders: Secondary | ICD-10-CM | POA: Insufficient documentation

## 2017-01-29 DIAGNOSIS — K5909 Other constipation: Secondary | ICD-10-CM | POA: Diagnosis not present

## 2017-01-29 DIAGNOSIS — E785 Hyperlipidemia, unspecified: Secondary | ICD-10-CM | POA: Diagnosis not present

## 2017-01-29 DIAGNOSIS — R928 Other abnormal and inconclusive findings on diagnostic imaging of breast: Secondary | ICD-10-CM

## 2017-01-29 DIAGNOSIS — D509 Iron deficiency anemia, unspecified: Secondary | ICD-10-CM | POA: Diagnosis not present

## 2017-01-29 DIAGNOSIS — M2041 Other hammer toe(s) (acquired), right foot: Secondary | ICD-10-CM | POA: Insufficient documentation

## 2017-01-29 NOTE — Patient Instructions (Addendum)
Call to schedule mammogram after 02/23/2017.  While you're on pain medicine, we need to also be on a bowel regimen - start senna-docusate daily. Continue metamucil daily. Let us know if not improving with this.  If leg swelling more painful or bothersome let us know for vein evaluation.  Let us know name of podiatrist you'd like to see

## 2017-01-29 NOTE — Assessment & Plan Note (Signed)
Will be due for rpt B dx mammo and R US after 02/23/2017 - ordered today

## 2017-01-29 NOTE — Progress Notes (Signed)
BP 140/84 (BP Location: Left Arm, Patient Position: Sitting, Cuff Size: Normal)   Pulse 80   Temp 98.5 F (36.9 C) (Oral)   Wt 135 lb (61.2 kg)   SpO2 99%   BMI 27.27 kg/m    CC: 6 mo f/u visit Subjective:    Patient ID: [name], female    DOB: 06-10-1946, 71 y.o.   MRN: [id number]  HPI: Dannika Hilgeman is a 71 y.o. female presenting on 01/29/2017 for 6 month follow-up; Abdominal Pain (Has come back like it was last year); and Diagnostic Mammogram Referral   R breast mass by mammogram 01/2016, stable on rpt [place] 08/2016 - rec rpt bilat dx mammo and R breast US in 6 months.   Recurrent abd pain - last 03/2016 - thought possible constipation related, started on senna/docusate and miralax regimen (in narcotic use). At that time, abd series showed moderate stool, abd US WNL. She currently uses metamucil 3 heaping tablespoons daily. She did try laxative, she has been using [employer] brand stool softener. She has not tolerated miralax.   Chronic back pain with L radiculopathy followed by Dr Yves Dill. She has increased hydrocodone use recently (1-3 tablets a day).   Would like referral for R 2nd toes - worsening pain at hammer toe. Noticing L leg swelling. No prior surgeries or procedures to left leg.   Relevant past medical, surgical, family and social history reviewed and updated as indicated. Interim medical history since our last visit reviewed. Allergies and medications reviewed and updated. Outpatient Medications Prior to Visit  Medication Sig Dispense Refill  . aspirin 81 MG tablet Take 81 mg by mouth daily.    . calcium carbonate (OS-CAL) 600 MG TABS tablet Take 600 mg by mouth 2 (two) times daily with a meal.    . cetirizine (ZYRTEC) 10 MG tablet Take 10 mg by mouth daily.    Marland Kitchen HYDROcodone-acetaminophen (NORCO) 7.5-325 MG per tablet Take 1 tablet by mouth 3 (three) times daily as needed for moderate pain. (Dr Yves Dill)    . ibuprofen (ADVIL,MOTRIN) 200 MG tablet Take  200 mg by mouth every 6 (six) hours as needed.    Marland Kitchen lisinopril (PRINIVIL,ZESTRIL) 10 MG tablet Take 1 tablet (10 mg total) by mouth daily. 90 tablet 1  . Omega-3 Fatty Acids (FISH OIL) 1200 MG CAPS Take 1 capsule by mouth 2 (two) times daily.    . promethazine (PHENERGAN) 25 MG tablet Take 25 mg by mouth 3 (three) times daily as needed.    . psyllium (METAMUCIL) 58.6 % powder Take 1 packet by mouth daily.    Marland Kitchen senna-docusate (SENOKOT-S) 8.6-50 MG tablet Take 1 tablet by mouth at bedtime as needed for mild constipation. (Patient taking differently: Take 1 tablet by mouth at bedtime. ) 30 tablet 0  . simvastatin (ZOCOR) 10 MG tablet TAKE ONE TABLET BY MOUTH ONCE DAILY 90 tablet 1  . vitamin B-12 (CYANOCOBALAMIN) 1000 MCG tablet Take 1 tablet (1,000 mcg total) by mouth daily.    . Homeopathic Products [facility] ALLERGY EYE RELIEF OP) Apply to eye.    . hydroxypropyl methylcellulose / hypromellose (ISOPTO TEARS / GONIOVISC) 2.5 % ophthalmic solution Place 1 drop into both eyes.    Marland Kitchen ketotifen (ALAWAY CHILDRENS ALLERGY) 0.025 % ophthalmic solution 1 drop 2 (two) times daily.     No facility-administered medications prior to visit.      Per HPI unless specifically indicated in ROS section below Review of Systems     Objective:  BP 140/84 (BP Location: Left Arm, Patient Position: Sitting, Cuff Size: Normal)   Pulse 80   Temp 98.5 F (36.9 C) (Oral)   Wt 135 lb (61.2 kg)   SpO2 99%   BMI 27.27 kg/m   Wt Readings from Last 3 Encounters:  01/29/17 135 lb (61.2 kg)  07/31/16 139 lb (63 kg)  04/21/16 130 lb 6.4 oz (59.1 kg)    Physical Exam  Constitutional: She appears well-developed and well-nourished. No distress.  HENT:  Head: Normocephalic and atraumatic.  Mouth/Throat: Oropharynx is clear and moist. No oropharyngeal exudate.  Eyes: Conjunctivae and EOM are normal. Pupils are equal, round, and reactive to light.  Neck: Normal range of motion. Neck supple.  Cardiovascular: Normal  rate, regular rhythm, normal heart sounds and intact distal pulses.   No murmur heard. Pulmonary/Chest: Effort normal and breath sounds normal. No respiratory distress. She has no wheezes. She has no rales.  Abdominal: Soft. Bowel sounds are normal. She exhibits no distension and no mass. There is no tenderness. There is no rebound and no guarding.  Musculoskeletal: She exhibits no edema.  R calf circ 15in L calf circ 16in 1+ DP bilaterally R 2nd hammer toe tender distally  Skin: Skin is warm and dry. No rash noted.  Psychiatric: She has a normal mood and affect.  Nursing note and vitals reviewed.  Results for orders placed or performed in visit on 07/27/16  Lipid panel  Result Value Ref Range   Cholesterol 228 (H) 0 - 200 mg/dL   Triglycerides 161.0136.0 0.0 - 149.0 mg/dL   HDL 96.0479.50 >54.09>39.00 mg/dL   VLDL 81.127.2 0.0 - 91.440.0 mg/dL   LDL Cholesterol 782121 (H) 0 - 99 mg/dL   Total CHOL/HDL Ratio 3    NonHDL 148.04   Hemoglobin A1c  Result Value Ref Range   Hgb A1c MFr Bld 6.1 4.6 - 6.5 %  CBC with Differential/Platelet  Result Value Ref Range   WBC 6.5 4.0 - 10.5 K/uL   RBC 3.44 (L) 3.87 - 5.11 Mil/uL   Hemoglobin 9.8 (L) 12.0 - 15.0 g/dL   HCT 95.629.6 (L) 21.336.0 - 08.646.0 %   MCV 86.2 78.0 - 100.0 fl   MCHC 33.1 30.0 - 36.0 g/dL   RDW 57.814.3 46.911.5 - 62.915.5 %   Platelets 310.0 150.0 - 400.0 K/uL   Neutrophils Relative % 60.2 43.0 - 77.0 %   Lymphocytes Relative 24.7 12.0 - 46.0 %   Monocytes Relative 9.6 3.0 - 12.0 %   Eosinophils Relative 5.0 0.0 - 5.0 %   Basophils Relative 0.5 0.0 - 3.0 %   Neutro Abs 3.9 1.4 - 7.7 K/uL   Lymphs Abs 1.6 0.7 - 4.0 K/uL   Monocytes Absolute 0.6 0.1 - 1.0 K/uL   Eosinophils Absolute 0.3 0.0 - 0.7 K/uL   Basophils Absolute 0.0 0.0 - 0.1 K/uL  Hepatitis C antibody  Result Value Ref Range   HCV Ab NEGATIVE NEGATIVE  Basic metabolic panel  Result Value Ref Range   Sodium 140 135 - 145 mEq/L   Potassium 4.3 3.5 - 5.1 mEq/L   Chloride 106 96 - 112 mEq/L   CO2 25  19 - 32 mEq/L   Glucose, Bld 93 70 - 99 mg/dL   BUN 21 6 - 23 mg/dL   Creatinine, Ser 5.280.86 0.40 - 1.20 mg/dL   Calcium 9.8 8.4 - 41.310.5 mg/dL   GFR 24.4068.79 >10.27>60.00 mL/min  Vitamin B12  Result Value Ref Range   Vitamin B-12  273 211 - 911 pg/mL  Ferritin  Result Value Ref Range   Ferritin 12.2 10.0 - 291.0 ng/mL  IBC panel  Result Value Ref Range   Iron 80 42 - 145 ug/dL   Transferrin 130.8 657.8 - 360.0 mg/dL   Saturation Ratios 46.9 (L) 20.0 - 50.0 %  Folate  Result Value Ref Range   Folate 20.9 >5.9 ng/mL      Assessment & Plan:   Problem List Items Addressed This Visit    Abnormal mammogram - Primary    Will be due for rpt B dx mammo and R [place] after 02/23/2017 - ordered today       Relevant Orders   MM Digital Diagnostic Bilat   US BREAST LTD UNI RIGHT INC AXILLA   Acquired hammer toe of right foot    She will call us with name of podiatrist she would like to see.       B12 deficiency    Pt reports compliance with b12 daily.  Update b12 level today.       Relevant Orders   Vitamin B12   Chronic constipation    Reviewed last year's workup - discussed need for bowel regimen in setting of chronic narcotic use. She cannot tolerate miralax or MOM. Will recommend daily metamucil, sennakot (senna/colace). Update if not improving with treatment. Benign exam today.       HLD (hyperlipidemia)    Update FLP - on simvastatin, fish oil.       Relevant Orders   Lipid panel   IDA (iron deficiency anemia)    She has not been taking iron. Update CBC. Will likely need iron (discussed this may worsen constipation).       Relevant Orders   Ferritin   CBC with Differential/Platelet   Iron   Left leg swelling    Without significant pain. Will continue to monitor for now. Not consistent with DVT. Consider vein evaluation           Follow up plan: No Follow-up on file.  Eustaquio Boyden, MD

## 2017-01-29 NOTE — Assessment & Plan Note (Addendum)
Pt reports compliance with b12 daily.  Update b12 level today.

## 2017-01-29 NOTE — Assessment & Plan Note (Signed)
Update FLP - on simvastatin, fish oil.

## 2017-01-29 NOTE — Assessment & Plan Note (Addendum)
Reviewed last year's workup - discussed need for bowel regimen in setting of chronic narcotic use. She cannot tolerate miralax or MOM. Will recommend daily metamucil, sennakot (senna/colace). Update if not improving with treatment. Benign exam today.

## 2017-01-29 NOTE — Assessment & Plan Note (Addendum)
Without significant pain. Will continue to monitor for now. Not consistent with DVT. Consider vein evaluation

## 2017-01-29 NOTE — Assessment & Plan Note (Signed)
She will call us with name of podiatrist she would like to see.

## 2017-01-29 NOTE — Assessment & Plan Note (Signed)
She has not been taking iron. Update CBC. Will likely need iron (discussed this may worsen constipation).

## 2017-01-30 ENCOUNTER — Other Ambulatory Visit: Payer: Self-pay | Admitting: Family Medicine

## 2017-01-30 LAB — FERRITIN: FERRITIN: 10 ng/mL — AB (ref 20–288)

## 2017-01-30 LAB — LIPID PANEL
CHOL/HDL RATIO: 2.5 ratio (ref <5.0)
Cholesterol: 196 mg/dL (ref <200)
HDL: 77 mg/dL (ref >50)
LDL CALC: 95 mg/dL (ref <100)
TRIGLYCERIDES: 118 mg/dL (ref <150)
VLDL: 24 mg/dL (ref <30)

## 2017-01-30 LAB — CBC WITH DIFFERENTIAL/PLATELET
BASOS ABS: 66 {cells}/uL (ref 0–200)
Basophils Relative: 1 %
EOS PCT: 4 %
Eosinophils Absolute: 264 cells/uL (ref 15–500)
HEMATOCRIT: 30.7 % — AB (ref 35.0–45.0)
HEMOGLOBIN: 9.8 g/dL — AB (ref 11.7–15.5)
LYMPHS ABS: 1980 {cells}/uL (ref 850–3900)
LYMPHS PCT: 30 %
MCH: 27.5 pg (ref 27.0–33.0)
MCHC: 31.9 g/dL — AB (ref 32.0–36.0)
MCV: 86 fL (ref 80.0–100.0)
MONO ABS: 594 {cells}/uL (ref 200–950)
MPV: 9.3 fL (ref 7.5–12.5)
Monocytes Relative: 9 %
NEUTROS PCT: 56 %
Neutro Abs: 3696 cells/uL (ref 1500–7800)
Platelets: 365 10*3/uL (ref 140–400)
RBC: 3.57 MIL/uL — ABNORMAL LOW (ref 3.80–5.10)
RDW: 15.9 % — AB (ref 11.0–15.0)
WBC: 6.6 10*3/uL (ref 3.8–10.8)

## 2017-01-30 LAB — VITAMIN B12: Vitamin B-12: 1162 pg/mL — ABNORMAL HIGH (ref 200–1100)

## 2017-01-30 LAB — IRON: Iron: 38 ug/dL — ABNORMAL LOW (ref 45–160)

## 2017-01-30 MED ORDER — FERROUS SULFATE 325 (65 FE) MG PO TABS: 325.0000 mg | ORAL_TABLET | Freq: Every day | ORAL | 3 refills | Status: DC

## 2017-01-30 MED ORDER — VITAMIN B-12 500 MCG PO TABS: 500.0000 ug | ORAL_TABLET | Freq: Every day | ORAL | Status: DC

## 2017-02-26 DIAGNOSIS — M5136 Other intervertebral disc degeneration, lumbar region: Secondary | ICD-10-CM | POA: Diagnosis not present

## 2017-02-26 DIAGNOSIS — M5416 Radiculopathy, lumbar region: Secondary | ICD-10-CM | POA: Diagnosis not present

## 2017-02-26 DIAGNOSIS — M48062 Spinal stenosis, lumbar region with neurogenic claudication: Secondary | ICD-10-CM | POA: Diagnosis not present

## 2017-03-02 ENCOUNTER — Ambulatory Visit
Admission: RE | Admit: 2017-03-02 | Discharge: 2017-03-02 | Disposition: A | Discharge status: Home / Self Care | Payer: Medicare Other | Source: Ambulatory Visit | Attending: Family Medicine | Admitting: Family Medicine

## 2017-03-02 ENCOUNTER — Other Ambulatory Visit: Payer: Self-pay | Admitting: Family Medicine

## 2017-03-02 DIAGNOSIS — R928 Other abnormal and inconclusive findings on diagnostic imaging of breast: Secondary | ICD-10-CM

## 2017-03-02 DIAGNOSIS — N6312 Unspecified lump in the right breast, upper inner quadrant: Secondary | ICD-10-CM | POA: Diagnosis not present

## 2017-03-02 DIAGNOSIS — N6489 Other specified disorders of breast: Secondary | ICD-10-CM | POA: Diagnosis not present

## 2017-04-02 ENCOUNTER — Other Ambulatory Visit: Payer: Self-pay | Admitting: *Deleted

## 2017-04-02 MED ORDER — SIMVASTATIN 10 MG PO TABS: 10.0000 mg | ORAL_TABLET | Freq: Every day | ORAL | 1 refills | Status: DC

## 2017-04-02 MED ORDER — LISINOPRIL 10 MG PO TABS: 10.0000 mg | ORAL_TABLET | Freq: Every day | ORAL | 1 refills | Status: DC

## 2017-06-04 DIAGNOSIS — M48062 Spinal stenosis, lumbar region with neurogenic claudication: Secondary | ICD-10-CM | POA: Diagnosis not present

## 2017-06-04 DIAGNOSIS — M5136 Other intervertebral disc degeneration, lumbar region: Secondary | ICD-10-CM | POA: Diagnosis not present

## 2017-06-04 DIAGNOSIS — M5416 Radiculopathy, lumbar region: Secondary | ICD-10-CM | POA: Diagnosis not present

## 2017-07-13 DIAGNOSIS — H401132 Primary open-angle glaucoma, bilateral, moderate stage: Secondary | ICD-10-CM | POA: Diagnosis not present

## 2017-07-20 DIAGNOSIS — H401113 Primary open-angle glaucoma, right eye, severe stage: Secondary | ICD-10-CM | POA: Diagnosis not present

## 2017-08-02 ENCOUNTER — Encounter: Payer: Self-pay | Admitting: Family Medicine

## 2017-08-02 ENCOUNTER — Ambulatory Visit: Payer: Medicare Other | Admitting: Family Medicine

## 2017-08-02 VITALS — BP 126/78 | HR 81 | Temp 97.9°F | Wt 124.2 lb

## 2017-08-02 DIAGNOSIS — R634 Abnormal weight loss: Secondary | ICD-10-CM

## 2017-08-02 DIAGNOSIS — R1084 Generalized abdominal pain: Secondary | ICD-10-CM | POA: Diagnosis not present

## 2017-08-02 DIAGNOSIS — D509 Iron deficiency anemia, unspecified: Secondary | ICD-10-CM

## 2017-08-02 DIAGNOSIS — M48062 Spinal stenosis, lumbar region with neurogenic claudication: Secondary | ICD-10-CM

## 2017-08-02 DIAGNOSIS — K5909 Other constipation: Secondary | ICD-10-CM | POA: Diagnosis not present

## 2017-08-02 MED ORDER — LISINOPRIL 10 MG PO TABS: 10.0000 mg | ORAL_TABLET | Freq: Every day | ORAL | 3 refills | Status: DC

## 2017-08-02 MED ORDER — OMEPRAZOLE 40 MG PO CPDR: 40.0000 mg | DELAYED_RELEASE_CAPSULE | Freq: Every day | ORAL | 6 refills | Status: DC

## 2017-08-02 NOTE — Assessment & Plan Note (Addendum)
Chronic. Appreciate PM&R care. Managed with NSAID and narcotic.

## 2017-08-02 NOTE — Assessment & Plan Note (Addendum)
Expected weight loss - correlates with starting weight watchers.  Don't recommend ongoing weight loss but to maintain current BMI. Pt agrees with plan.

## 2017-08-02 NOTE — Assessment & Plan Note (Signed)
Chronic, managed with daily metamucil and sennakot, stable bowel regimen at this time.

## 2017-08-02 NOTE — Assessment & Plan Note (Addendum)
H/o this several weeks ago, largely resolved over last 2 weeks unclear cause. Pt endorses feeling well. Reviewed latest abd US with patient.  She regularly takes NSAIDs - I recommended we start PPI omeprazole 40mg  daily for GI protection while on regular NSAIDs. Pt agrees with plan.

## 2017-08-02 NOTE — Progress Notes (Signed)
BP 126/78 (BP Location: Left Arm, Patient Position: Sitting, Cuff Size: Normal)   Pulse 81   Temp 97.9 F (36.6 C) (Oral)   Wt 124 lb 4 oz (56.4 kg)   SpO2 96%   BMI 25.10 kg/m    CC: 6 mo f/u visit Subjective:    Patient ID: Brandy Young, female    DOB: 1946-03-23, 71 y.o.   MRN: [id number]  HPI: Brandy Young is a 71 y.o. female presenting on 08/02/2017 for 6 mo follow-up   Next mammogram due 01/2017.  For last few months felt bad with GI distress/upset worse with any food intake - acid reflux, abdominal pain, constipation. Last few weeks has felt great.  Iron tablets started 6 months ago.  Regularly takes metamucil and sennakot daily.   Weight loss - 15 lbs in the past year. Started weight watchers 11/2016.   Relevant past medical, surgical, family and social history reviewed and updated as indicated. Interim medical history since our last visit reviewed. Allergies and medications reviewed and updated. Outpatient Medications Prior to Visit  Medication Sig Dispense Refill  . calcium carbonate (OS-CAL) 600 MG TABS tablet Take 600 mg by mouth 2 (two) times daily with a meal.    . cetirizine (ZYRTEC) 10 MG tablet Take 10 mg by mouth daily.    . ferrous sulfate 325 (65 FE) MG tablet Take 1 tablet (325 mg total) by mouth daily with breakfast.  3  . HYDROcodone-acetaminophen (NORCO) 7.5-325 MG per tablet Take 1 tablet by mouth 3 (three) times daily as needed for moderate pain. (Dr Sharlet Salina)    . ibuprofen (ADVIL,MOTRIN) 200 MG tablet Take 200 mg by mouth every 6 (six) hours as needed.    . Omega-3 Fatty Acids (FISH OIL) 1200 MG CAPS Take 1 capsule by mouth 2 (two) times daily.    . promethazine (PHENERGAN) 25 MG tablet Take 25 mg by mouth 3 (three) times daily as needed.    . psyllium (METAMUCIL) 58.6 % powder Take 1 packet by mouth daily.    Marland Kitchen senna-docusate (SENOKOT-S) 8.6-50 MG tablet Take 1 tablet by mouth at bedtime as needed for mild constipation. (Patient  taking differently: Take 1 tablet by mouth at bedtime. ) 30 tablet 0  . simvastatin (ZOCOR) 10 MG tablet Take 1 tablet (10 mg total) by mouth daily. 90 tablet 1  . vitamin B-12 (CYANOCOBALAMIN) 500 MCG tablet Take 1 tablet (500 mcg total) by mouth daily.    Marland Kitchen aspirin 81 MG tablet Take 81 mg by mouth daily.    Marland Kitchen lisinopril (PRINIVIL,ZESTRIL) 10 MG tablet Take 1 tablet (10 mg total) by mouth daily. 90 tablet 1   No facility-administered medications prior to visit.      Per HPI unless specifically indicated in ROS section below Review of Systems     Objective:    BP 126/78 (BP Location: Left Arm, Patient Position: Sitting, Cuff Size: Normal)   Pulse 81   Temp 97.9 F (36.6 C) (Oral)   Wt 124 lb 4 oz (56.4 kg)   SpO2 96%   BMI 25.10 kg/m   Wt Readings from Last 3 Encounters:  08/02/17 124 lb 4 oz (56.4 kg)  01/29/17 135 lb (61.2 kg)  07/31/16 139 lb (63 kg)    Physical Exam  Constitutional: She appears well-developed and well-nourished. No distress.  HENT:  Mouth/Throat: Oropharynx is clear and moist. No oropharyngeal exudate.  Cardiovascular: Normal rate, regular rhythm, normal heart sounds and intact distal pulses.  No murmur heard. Pulmonary/Chest: Effort normal and breath sounds normal. No respiratory distress. She has no wheezes. She has no rales.  Abdominal: Soft. Bowel sounds are normal. She exhibits no distension and no mass. There is no tenderness. There is no rebound and no guarding.  Palpable abdominal aorta without bruit  Musculoskeletal: She exhibits no edema.  Skin: Skin is warm and dry. No rash noted.  Psychiatric: She has a normal mood and affect.  Nursing note and vitals reviewed.  Results for orders placed or performed in visit on 01/29/17  Ferritin  Result Value Ref Range   Ferritin 10 (L) 20 - 288 ng/mL  Vitamin B12  Result Value Ref Range   Vitamin B-12 1,162 (H) 200 - 1,100 pg/mL  Lipid panel  Result Value Ref Range   Cholesterol 196 <200 mg/dL    Triglycerides 118 <150 mg/dL   HDL 77 >50 mg/dL   Total CHOL/HDL Ratio 2.5 <5.0 Ratio   VLDL 24 <30 mg/dL   LDL Cholesterol 95 <100 mg/dL  CBC with Differential/Platelet  Result Value Ref Range   WBC 6.6 3.8 - 10.8 K/uL   RBC 3.57 (L) 3.80 - 5.10 MIL/uL   Hemoglobin 9.8 (L) 11.7 - 15.5 g/dL   HCT 30.7 (L) 35.0 - 45.0 %   MCV 86.0 80.0 - 100.0 fL   MCH 27.5 27.0 - 33.0 pg   MCHC 31.9 (L) 32.0 - 36.0 g/dL   RDW 15.9 (H) 11.0 - 15.0 %   Platelets 365 140 - 400 K/uL   MPV 9.3 7.5 - 12.5 fL   Neutro Abs 3,696 1,500 - 7,800 cells/uL   Lymphs Abs 1,980 850 - 3,900 cells/uL   Monocytes Absolute 594 200 - 950 cells/uL   Eosinophils Absolute 264 15 - 500 cells/uL   Basophils Absolute 66 0 - 200 cells/uL   Neutrophils Relative % 56 %   Lymphocytes Relative 30 %   Monocytes Relative 9 %   Eosinophils Relative 4 %   Basophils Relative 1 %   Smear Review Criteria for review not met   Iron  Result Value Ref Range   Iron 38 (L) 45 - 160 ug/dL      Assessment & Plan:  No known CAD. I did recommend she stop aspirin '81mg'$  for primary prevention of cardiac disease.  Problem List Items Addressed This Visit    Chronic constipation    Chronic, managed with daily metamucil and sennakot, stable bowel regimen at this time.       Generalized abdominal pain - Primary    H/o this several weeks ago, largely resolved over last 2 weeks unclear cause. Pt endorses feeling well. Reviewed latest abd [place] with patient.  She regularly takes NSAIDs - I recommended we start PPI omeprazole '40mg'$  daily for GI protection while on regular NSAIDs. Pt agrees with plan.       Relevant Orders   Comprehensive metabolic panel   CBC with Differential/Platelet   IDA (iron deficiency anemia)    Compliant with ferrous sulfate '325mg'$  daily over the last 6 months. Update labs today.  S/p normal EGD/colonoscopy 2016 ([facility]) never returned for f/u.       Relevant Orders   CBC with Differential/Platelet   TSH   Ferritin     IBC panel   Lumbar stenosis with neurogenic claudication    Chronic. Appreciate PM&R care. Managed with NSAID and narcotic.       Weight loss    Expected weight loss - correlates with starting  weight watchers.  Don't recommend ongoing weight loss but to maintain current BMI. Pt agrees with plan.           Follow up plan: Return in about 4 months (around 12/01/2017) for annual exam, prior fasting for blood work, medicare wellness visit.  Ria Bush, MD

## 2017-08-02 NOTE — Assessment & Plan Note (Signed)
Compliant with ferrous sulfate 325mg  daily over the last 6 months. Update labs today.  S/p normal EGD/colonoscopy 2016 ([facility]) never returned for f/u.

## 2017-08-02 NOTE — Patient Instructions (Addendum)
You are doing well today. I'd like you to maintain this weight.  Labs today.  Return for wellness visit with Brandy Young and physical with me in 3-4 months.  Stop aspirin and try to back off ibuprofen. Price out omeprazole 40mg  daily to every other day.

## 2017-08-03 LAB — CBC WITH DIFFERENTIAL/PLATELET
BASOS PCT: 1.3 % (ref 0.0–3.0)
Basophils Absolute: 0.1 10*3/uL (ref 0.0–0.1)
EOS ABS: 0.3 10*3/uL (ref 0.0–0.7)
EOS PCT: 4.2 % (ref 0.0–5.0)
HCT: 33.9 % — ABNORMAL LOW (ref 36.0–46.0)
HEMOGLOBIN: 11.2 g/dL — AB (ref 12.0–15.0)
LYMPHS ABS: 2 10*3/uL (ref 0.7–4.0)
Lymphocytes Relative: 29.4 % (ref 12.0–46.0)
MCHC: 33 g/dL (ref 30.0–36.0)
MCV: 95.2 fl (ref 78.0–100.0)
MONO ABS: 0.6 10*3/uL (ref 0.1–1.0)
Monocytes Relative: 9 % (ref 3.0–12.0)
NEUTROS ABS: 3.8 10*3/uL (ref 1.4–7.7)
Neutrophils Relative %: 56.1 % (ref 43.0–77.0)
PLATELETS: 308 10*3/uL (ref 150.0–400.0)
RBC: 3.56 Mil/uL — ABNORMAL LOW (ref 3.87–5.11)
RDW: 13.1 % (ref 11.5–15.5)
WBC: 6.8 10*3/uL (ref 4.0–10.5)

## 2017-08-03 LAB — COMPREHENSIVE METABOLIC PANEL
ALBUMIN: 4.2 g/dL (ref 3.5–5.2)
ALT: 15 U/L (ref 0–35)
AST: 27 U/L (ref 0–37)
Alkaline Phosphatase: 71 U/L (ref 39–117)
BUN: 26 mg/dL — AB (ref 6–23)
CHLORIDE: 107 meq/L (ref 96–112)
CO2: 25 meq/L (ref 19–32)
CREATININE: 0.75 mg/dL (ref 0.40–1.20)
Calcium: 9.7 mg/dL (ref 8.4–10.5)
GFR: 80.33 mL/min (ref >60.00)
GLUCOSE: 79 mg/dL (ref 70–99)
POTASSIUM: 4.2 meq/L (ref 3.5–5.1)
SODIUM: 140 meq/L (ref 135–145)
Total Bilirubin: 0.5 mg/dL (ref 0.2–1.2)
Total Protein: 6.8 g/dL (ref 6.0–8.3)

## 2017-08-03 LAB — IBC PANEL
Iron: 65 ug/dL (ref 42–145)
SATURATION RATIOS: 15.2 % — AB (ref 20.0–50.0)
Transferrin: 305 mg/dL (ref 212.0–360.0)

## 2017-08-03 LAB — FERRITIN: FERRITIN: 23.2 ng/mL (ref 10.0–291.0)

## 2017-08-03 LAB — TSH: TSH: 1.48 u[IU]/mL (ref 0.35–4.50)

## 2017-08-15 ENCOUNTER — Other Ambulatory Visit: Payer: Self-pay | Admitting: Family Medicine

## 2017-09-10 DIAGNOSIS — M5136 Other intervertebral disc degeneration, lumbar region: Secondary | ICD-10-CM | POA: Diagnosis not present

## 2017-09-10 DIAGNOSIS — M5416 Radiculopathy, lumbar region: Secondary | ICD-10-CM | POA: Diagnosis not present

## 2017-09-10 DIAGNOSIS — M48062 Spinal stenosis, lumbar region with neurogenic claudication: Secondary | ICD-10-CM | POA: Diagnosis not present

## 2017-09-30 ENCOUNTER — Other Ambulatory Visit: Payer: Self-pay | Admitting: Family Medicine

## 2017-09-30 DIAGNOSIS — E785 Hyperlipidemia, unspecified: Secondary | ICD-10-CM

## 2017-09-30 DIAGNOSIS — R7303 Prediabetes: Secondary | ICD-10-CM

## 2017-09-30 DIAGNOSIS — E538 Deficiency of other specified B group vitamins: Secondary | ICD-10-CM

## 2017-09-30 DIAGNOSIS — D509 Iron deficiency anemia, unspecified: Secondary | ICD-10-CM

## 2017-10-01 ENCOUNTER — Other Ambulatory Visit: Payer: Medicare Other

## 2017-10-04 ENCOUNTER — Ambulatory Visit (INDEPENDENT_AMBULATORY_CARE_PROVIDER_SITE_OTHER): Payer: Medicare Other

## 2017-10-04 VITALS — BP 130/74 | HR 63 | Temp 98.5°F | Ht <= 58 in | Wt 119.8 lb

## 2017-10-04 DIAGNOSIS — R7303 Prediabetes: Secondary | ICD-10-CM | POA: Diagnosis not present

## 2017-10-04 DIAGNOSIS — E2839 Other primary ovarian failure: Secondary | ICD-10-CM

## 2017-10-04 DIAGNOSIS — N6310 Unspecified lump in the right breast, unspecified quadrant: Secondary | ICD-10-CM | POA: Diagnosis not present

## 2017-10-04 DIAGNOSIS — Z0000 Encounter for general adult medical examination without abnormal findings: Secondary | ICD-10-CM | POA: Diagnosis not present

## 2017-10-04 DIAGNOSIS — E785 Hyperlipidemia, unspecified: Secondary | ICD-10-CM

## 2017-10-04 LAB — HEMOGLOBIN A1C: Hgb A1c MFr Bld: 6.1 % (ref 4.6–6.5)

## 2017-10-04 LAB — LIPID PANEL
CHOLESTEROL: 206 mg/dL — AB (ref 0–200)
HDL: 90.4 mg/dL (ref >39.00)
LDL CALC: 96 mg/dL (ref 0–99)
NonHDL: 115.4
TRIGLYCERIDES: 98 mg/dL (ref 0.0–149.0)
Total CHOL/HDL Ratio: 2
VLDL: 19.6 mg/dL (ref 0.0–40.0)

## 2017-10-04 NOTE — Progress Notes (Signed)
Subjective:   Carmelina Daneatricia Dupuis Dry is a 72 y.o. female who presents for Medicare Annual (Subsequent) preventive examination.  Review of Systems:  N/A Cardiac Risk Factors include: advanced age (>3455men, 31>65 women);dyslipidemia;hypertension     Objective:     Vitals: BP 130/74 (BP Location: Right Arm, Patient Position: Sitting, Cuff Size: Normal)   Pulse 63   Temp 98.5 F (36.9 C) (Oral)   Ht 4\' 10"  (1.473 m) Comment: no shoes  Wt 119 lb 12 oz (54.3 kg)   SpO2 97%   BMI 25.03 kg/m   Body mass index is 25.03 kg/m.  Advanced Directives 10/04/2017  Does Patient Have a Medical Advance Directive? No  Would patient like information on creating a medical advance directive? No - Patient declined;Yes (MAU/Ambulatory/Procedural Areas - Information given)    Tobacco Social History   Tobacco Use  Smoking Status Never Smoker  Smokeless Tobacco Never Used     Counseling given: No   Clinical Intake:  Pre-visit preparation completed: Yes  Pain : 0-10 Pain Score: 3  Pain Type: Chronic pain Pain Location: Leg Pain Orientation: Right Pain Onset: More than a month ago     Nutritional Status: BMI 25 -29 Overweight Nutritional Risks: None Diabetes: No  How often do you need to have someone help you when you read instructions, pamphlets, or other written materials from your doctor or pharmacy?: 1 - Never What is the last grade level you completed in school?: 12th grade  Interpreter Needed?: No  Comments: pt is a widower and lives alone Information entered by :: LPinson, LPN  Past Medical History:  Diagnosis Date  . Allergic rhinitis due to pollen   . DDD (degenerative disc disease), lumbar    with spinal stenosis and neurogenic claudication s/p ESI (Chasnis)  . DJD (degenerative joint disease) of knee    failed steroid and synvisc injections  . Glaucoma   . History of chicken pox   . History of stomach ulcers 2010s  . HLD (hyperlipidemia)   . HTN (hypertension)     . IDA (iron deficiency anemia) 2012   s/p normal EGD/colonoscopy (iftikhar)  . 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 Prediabetes   . Tricuspid regurgitation 2012   by echo, EF 73% [place] Flakes([facility])   Past Surgical History:  Procedure Laterality Date  . APPENDECTOMY    . COLONOSCOPY  08/2011   diverticulosis, int hem ([facility]ftikhar)  . ESI  multiple, latest 03/2015   R L5/S1, L L3/4, L L4/5 L5/S1, L L4/5 and L5/S1 - minimal relief (Chasnis)  . ESOPHAGOGASTRODUODENOSCOPY  08/2011   WNL  . EYE SURGERY     laser eye surgery  . FOOT SURGERY Right   . VAGINAL HYSTERECTOMY  1980s   prolapsed uterus, ovaries remained   Family History  Problem Relation Age of Onset  . Stroke Father 48       hemorrhagic bleed  . Hypertension Father   . Cancer Mother [id number]       breast  . Breast cancer Mother 343  . Cancer Brother [id number]       lung (nonsmoker)  . Breast cancer Paternal Grandmother   . CAD Neg Hx   . Diabetes Neg Hx    Social History   Socioeconomic History  . Marital status: Widowed    Spouse name: None  . Number of children: None  . Years of education: None  .  Highest education level: None  Social Needs  . Financial resource strain: None  . Food insecurity - worry: None  . Food insecurity - inability: None  . Transportation needs - medical: None  . Transportation needs - non-medical: None  Occupational History  . None  Tobacco Use  . Smoking status: Never Smoker  . Smokeless tobacco: Never Used  Substance and Sexual Activity  . Alcohol use: No    Alcohol/week: 0.0 oz  . Drug use: No  . Sexual activity: Not Currently  Other Topics Concern  . None  Social History Narrative   Lives alone, 1 dog. Widow - heart issues   Children nearby   Occupation: part time Engineer, [employer]   Edu: HS   Activity: stays active with family and church   Diet: good water, fruits/vegetables daily     Outpatient Encounter Medications as of 10/04/2017  Medication Sig  . calcium carbonate (OS-CAL) 600 MG TABS tablet Take 600 mg by mouth 2 (two) times daily with a meal.  . cetirizine (ZYRTEC) 10 MG tablet Take 10 mg by mouth daily.  . ferrous sulfate 325 (65 FE) MG tablet Take 1 tablet (325 mg total) by mouth daily with breakfast.  . HYDROcodone-acetaminophen (NORCO) 7.5-325 MG per tablet Take 1 tablet by mouth 3 (three) times daily as needed for moderate pain. (Dr Yves Dill)  . ibuprofen (ADVIL,MOTRIN) 200 MG tablet Take 200 mg by mouth every 6 (six) hours as needed.  Marland Kitchen lisinopril (PRINIVIL,ZESTRIL) 10 MG tablet Take 1 tablet (10 mg total) by mouth daily.  Marland Kitchen MAGNESIUM PO Take 250 mg by mouth 2 (two) times daily.  . Omega-3 Fatty Acids (FISH OIL) 1200 MG CAPS Take 1 capsule by mouth 2 (two) times daily.  Marland Kitchen omeprazole (PRILOSEC) 40 MG capsule Take 1 capsule (40 mg total) by mouth daily.  . psyllium (METAMUCIL) 58.6 % powder Take 1 packet by mouth daily.  Marland Kitchen senna-docusate (SENOKOT-S) 8.6-50 MG tablet Take 1 tablet by mouth at bedtime as needed for mild constipation. (Patient taking differently: Take 1 tablet by mouth at bedtime. )  . simvastatin (ZOCOR) 10 MG tablet TAKE 1 TABLET BY MOUTH  DAILY  . vitamin B-12 (CYANOCOBALAMIN) 500 MCG tablet Take 1 tablet (500 mcg total) by mouth daily.  . [DISCONTINUED] promethazine (PHENERGAN) 25 MG tablet Take 25 mg by mouth 3 (three) times daily as needed.   No facility-administered encounter medications on file as of 10/04/2017.     Activities of Daily Living In your present state of health, do you have any difficulty performing the following activities: 10/04/2017  Hearing? N  Vision? Y  Difficulty concentrating or making decisions? N  Walking or climbing stairs? Y  Dressing or bathing? N  Doing errands, shopping? N  Preparing Food and eating ? N  Using the Toilet? N  In the past six months, have you accidently leaked urine? N  Do you have problems  with loss of bowel control? N  Managing your Medications? N  Managing your Finances? N  Housekeeping or managing your Housekeeping? N  Some recent data might be hidden    Patient Care Team: Eustaquio Boyden, MD as PCP - General (Family Medicine) Merri Ray, MD as Referring Physician (Physical Medicine and Rehabilitation) Lockie Mola, MD as Referring Physician (Ophthalmology)    Assessment:   This is a routine wellness examination for Felipa.   Hearing Screening   125Hz  250Hz  500Hz  1000Hz  2000Hz  3000Hz  4000Hz  6000Hz  8000Hz   Right ear:   40 40 40  40    Left ear:   40 40 40  40    Vision Screening Comments: Last vision exam in Dec 2018 with Dr. 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 Increase physical activity     When schedule permits, I will attempt to exercise for at least 30 minutes 5 days per week.        Fall Risk Fall Risk  10/04/2017 07/31/2016 04/19/2015  Falls in the past year? No Yes Yes  Number falls in past yr: - 2 or more 2 or more  Injury with Fall? - No No   Depression Screen PHQ 2/9 Scores 10/04/2017 07/31/2016 04/19/2015  PHQ - 2 Score 0 0 0  PHQ- 9 Score 0 - -     Cognitive Function MMSE - Mini Mental State Exam 10/04/2017  Orientation to time 5  Orientation to Place 5  Registration 3  Attention/ Calculation 0  Recall 2  Recall-comments unable to recall 1 of 3 words  Language- name 2 objects 0  Language- repeat 1  Language- follow 3 step command 3  Language- read & follow direction 0  Write a sentence 0  Copy design 0  Total score 19     PLEASE NOTE: A Mini-Cog screen was completed. Maximum score is 20. A value of 0 denotes this part of Folstein MMSE was not completed or the patient failed this part of the  Mini-Cog screening.   Mini-Cog Screening Orientation to Time - Max 5 pts Orientation to Place - Max 5 pts Registration - Max 3 pts Recall - Max 3 pts Language Repeat - Max 1 pts Language Follow 3 Step Command - Max 3 pts     Immunization History  Administered Date(s) Administered  . Influenza, High Dose Seasonal PF 07/07/2015, 06/29/2016  . Influenza-Unspecified 05/31/2014, 06/14/2017  . Pneumococcal Conjugate-13 07/31/2014  . Pneumococcal Polysaccharide-23 06/29/2016  . Tdap 04/17/2013  . Zoster 08/31/2008  . Zoster Recombinat (Shingrix) 06/14/2017    Screening Tests Health Maintenance  Topic Date Due  . DEXA SCAN  08/30/2018 (Originally 10/17/2008)  . MAMMOGRAM  03/02/2018  . COLONOSCOPY  08/09/2021  . DTaP/Tdap/Td (2 - Td) 04/18/2023  . TETANUS/TDAP  04/18/2023  . INFLUENZA VACCINE  Completed  . Hepatitis C Screening  Completed  . PNA vac Low Risk Adult  Completed       Plan:     I have personally reviewed, addressed, and noted the following in the patient's chart:  A. Medical and social history B. Use of alcohol, tobacco or illicit drugs  C. Current medications and supplements D. Functional ability and status E.  Nutritional status F.  Physical activity G. Advance directives H. List of other physicians I.  Hospitalizations, surgeries, and ER visits in previous 12 months J.  Vitals K. Screenings to include hearing, vision, cognitive, depression L. Referrals and appointments - none  In addition, I have reviewed and discussed with patient certain preventive protocols, quality metrics, and best practice recommendations. A written personalized care plan for preventive services as well as general preventive health recommendations were provided to patient.  See attached scanned questionnaire for additional information.   Signed,   Randa Evens, MHA, BS, LPN Health Coach

## 2017-10-04 NOTE — Patient Instructions (Signed)
[name] , Thank you for taking time to come for your Medicare Wellness Visit. I appreciate your ongoing commitment to your health goals. Please review the following plan we discussed and let me know if I can assist you in the future.   These are the goals we discussed: Goals    . Increase physical activity     When schedule permits, I will attempt to exercise for at least 30 minutes 5 days per week.        This is a list of the screening recommended for you and due dates:  Health Maintenance  Topic Date Due  . DEXA scan (bone density measurement)  08/30/2018*  . Mammogram  03/02/2018  . Colon Cancer Screening  08/09/2021  . DTaP/Tdap/Td vaccine (2 - Td) 04/18/2023  . Tetanus Vaccine  04/18/2023  . Flu Shot  Completed  .  Hepatitis C: One time screening is recommended by Center for Disease Control  (CDC) for  adults born from [birth year] through 1967.   Completed  . Pneumonia vaccines  Completed  *Topic was postponed. The date shown is not the original due date.   Preventive Care for Adults  A healthy lifestyle and preventive care can promote health and wellness. Preventive health guidelines for adults include the following key practices.  . A routine yearly physical is a good way to check with your health care provider about your health and preventive screening. It is a chance to share any concerns and updates on your health and to receive a thorough exam.  . Visit your dentist for a routine exam and preventive care every 6 months. Brush your teeth twice a day and floss once a day. Good oral hygiene prevents tooth decay and gum disease.  . The frequency of eye exams is based on your age, health, family medical history, use  of contact lenses, and other factors. Follow your health care provider's recommendations for frequency of eye exams.  . Eat a healthy diet. Foods like vegetables, fruits, whole grains, low-fat dairy products, and lean protein foods contain the nutrients you need  without too many calories. Decrease your intake of foods high in solid fats, added sugars, and salt. Eat the right amount of calories for you. Get information about a proper diet from your health care provider, if necessary.  . Regular physical exercise is one of the most important things you can do for your health. Most adults should get at least 150 minutes of moderate-intensity exercise (any activity that increases your heart rate and causes you to sweat) each week. In addition, most adults need muscle-strengthening exercises on 2 or more days a week.  Silver Sneakers may be a benefit available to you. To determine eligibility, you may visit the website: www.silversneakers.com or contact program at [phone] Mon-Fri between 8AM-8PM.   . Maintain a healthy weight. The body mass index (BMI) is a screening tool to identify possible weight problems. It provides an estimate of body fat based on height and weight. Your health care provider can find your BMI and can help you achieve or maintain a healthy weight.   For adults 20 years and older: ? A BMI below 18.5 is considered underweight. ? A BMI of 18.5 to 24.9 is normal. ? A BMI of 25 to 29.9 is considered overweight. ? A BMI of 30 and above is considered obese.   . Maintain normal blood lipids and cholesterol levels by exercising and minimizing your intake of saturated fat. Eat a balanced  diet with plenty of fruit and vegetables. Blood tests for lipids and cholesterol should begin at age 2 and be repeated every 5 years. If your lipid or cholesterol levels are high, you are over 50, or you are at high risk for heart disease, you may need your cholesterol levels checked more frequently. Ongoing high lipid and cholesterol levels should be treated with medicines if diet and exercise are not working.  . If you smoke, find out from your health care provider how to quit. If you do not use tobacco, please do not start.  . If you choose to drink  alcohol, please do not consume more than 2 drinks per day. One drink is considered to be 12 ounces (355 mL) of beer, 5 ounces (148 mL) of wine, or 1.5 ounces (44 mL) of liquor.  . If you are 76-79 years old, ask your health care provider if you should take aspirin to prevent strokes.  . Use sunscreen. Apply sunscreen liberally and repeatedly throughout the day. You should seek shade when your shadow is shorter than you. Protect yourself by wearing long sleeves, pants, a wide-brimmed hat, and sunglasses year round, whenever you are outdoors.  . Once a month, do a whole body skin exam, using a mirror to look at the skin on your back. Tell your health care provider of new moles, moles that have irregular borders, moles that are larger than a pencil eraser, or moles that have changed in shape or color.

## 2017-10-04 NOTE — Progress Notes (Signed)
PCP notes:   Health maintenance:  Bone density - addressed  Abnormal screenings:   Mini-Cog score: 19/20   Patient concerns:   None  Nurse concerns:  Orders generated for same day mammogram and bone density. PCP please review and sign if orders are approved. Pt stated she will schedule her own appointments.  Next PCP appt:   10/08/17 @ 1500

## 2017-10-04 NOTE — Progress Notes (Signed)
Pre visit review using our clinic review tool, if applicable. No additional management support is needed unless otherwise documented below in the visit note. 

## 2017-10-07 NOTE — Progress Notes (Signed)
I reviewed health advisor's note, was available for consultation, and agree with documentation and plan.  

## 2017-10-08 ENCOUNTER — Ambulatory Visit (INDEPENDENT_AMBULATORY_CARE_PROVIDER_SITE_OTHER): Payer: Medicare Other | Admitting: Family Medicine

## 2017-10-08 ENCOUNTER — Encounter: Payer: Self-pay | Admitting: Family Medicine

## 2017-10-08 VITALS — BP 136/78 | HR 70 | Temp 98.3°F | Ht <= 58 in | Wt 124.0 lb

## 2017-10-08 DIAGNOSIS — H409 Unspecified glaucoma: Secondary | ICD-10-CM

## 2017-10-08 DIAGNOSIS — R928 Other abnormal and inconclusive findings on diagnostic imaging of breast: Secondary | ICD-10-CM | POA: Diagnosis not present

## 2017-10-08 DIAGNOSIS — Z0000 Encounter for general adult medical examination without abnormal findings: Secondary | ICD-10-CM | POA: Diagnosis not present

## 2017-10-08 DIAGNOSIS — D509 Iron deficiency anemia, unspecified: Secondary | ICD-10-CM | POA: Diagnosis not present

## 2017-10-08 DIAGNOSIS — Z7189 Other specified counseling: Secondary | ICD-10-CM | POA: Diagnosis not present

## 2017-10-08 DIAGNOSIS — E785 Hyperlipidemia, unspecified: Secondary | ICD-10-CM

## 2017-10-08 DIAGNOSIS — R7303 Prediabetes: Secondary | ICD-10-CM

## 2017-10-08 DIAGNOSIS — I10 Essential (primary) hypertension: Secondary | ICD-10-CM

## 2017-10-08 DIAGNOSIS — K5909 Other constipation: Secondary | ICD-10-CM

## 2017-10-08 DIAGNOSIS — M48062 Spinal stenosis, lumbar region with neurogenic claudication: Secondary | ICD-10-CM

## 2017-10-08 MED ORDER — SIMVASTATIN 10 MG PO TABS: 10.0000 mg | ORAL_TABLET | Freq: Every day | ORAL | 3 refills | Status: DC

## 2017-10-08 NOTE — Assessment & Plan Note (Signed)
Preventative protocols reviewed and updated unless pt declined. Discussed healthy diet and lifestyle.  

## 2017-10-08 NOTE — Assessment & Plan Note (Addendum)
Chronic, stable on simvastatin and fish oil. Continue. Largely driven by high HDL. The 10-year ASCVD risk score Denman George(Goff DC Montez HagemanJr., et al., 2013) is: 18.7%   Values used to calculate the score:     Age: 89 years     Sex: Female     Is Non-Hispanic African American: No     Diabetic: No     Tobacco smoker: No     Systolic Blood Pressure: 136 mmHg     Is BP treated: Yes     HDL Cholesterol: 90.4 mg/dL     Total Cholesterol: 206 mg/dL

## 2017-10-08 NOTE — Assessment & Plan Note (Signed)
Reviewed with patient, avoid added sugars.

## 2017-10-08 NOTE — Patient Instructions (Addendum)
I don't think you need mammogram yet - until 02/2018.  Do call [facility] for bone density scan.  Advanced directive packet provided today.  You are doing well today.  Return as needed or in 1 year for next medicare wellness visit with Katha Cabal and physical with me Remind [place] if we haven't ordered mammogram by July. Health Maintenance, Female Adopting a healthy lifestyle and getting preventive care can go a long way to promote health and wellness. Talk with your health care provider about what schedule of regular examinations is right for you. This is a good chance for you to check in with your provider about disease prevention and staying healthy. In between checkups, there are plenty of things you can do on your own. Experts have done a lot of research about which lifestyle changes and preventive measures are most likely to keep you healthy. Ask your health care provider for more information. Weight and diet Eat a healthy diet  Be sure to include plenty of vegetables, fruits, low-fat dairy products, and lean protein.  Do not eat a lot of foods high in solid fats, added sugars, or salt.  Get regular exercise. This is one of the most important things you can do for your health. ? Most adults should exercise for at least 150 minutes each week. The exercise should increase your heart rate and make you sweat (moderate-intensity exercise). ? Most adults should also do strengthening exercises at least twice a week. This is in addition to the moderate-intensity exercise.  Maintain a healthy weight  Body mass index (BMI) is a measurement that can be used to identify possible weight problems. It estimates body fat based on height and weight. Your health care provider can help determine your BMI and help you achieve or maintain a healthy weight.  For females 20 years of age and older: ? A BMI below 18.5 is considered underweight. ? A BMI of 18.5 to 24.9 is normal. ? A BMI of 25 to 29.9 is considered  overweight. ? A BMI of 30 and above is considered obese.  Watch levels of cholesterol and blood lipids  You should start having your blood tested for lipids and cholesterol at 74 years of age, then have this test every 5 years.  You may need to have your cholesterol levels checked more often if: ? Your lipid or cholesterol levels are high. ? You are older than 74 years of age. ? You are at high risk for heart disease.  Cancer screening Lung Cancer  Lung cancer screening is recommended for adults 51-69 years old who are at high risk for lung cancer because of a history of smoking.  A yearly low-dose CT scan of the lungs is recommended for people who: ? Currently smoke. ? Have quit within the past 15 years. ? Have at least a 30-pack-year history of smoking. A pack year is smoking an average of one pack of cigarettes a day for 1 year.  Yearly screening should continue until it has been 15 years since you quit.  Yearly screening should stop if you develop a health problem that would prevent you from having lung cancer treatment.  Breast Cancer  Practice breast self-awareness. This means understanding how your breasts normally appear and feel.  It also means doing regular breast self-exams. Let your health care provider know about any changes, no matter how small.  If you are in your 20s or 30s, you should have a clinical breast exam (CBE) by a health care  provider every 1-3 years as part of a regular health exam.  If you are 44 or older, have a CBE every year. Also consider having a breast X-ray (mammogram) every year.  If you have a family history of breast cancer, talk to your health care provider about genetic screening.  If you are at high risk for breast cancer, talk to your health care provider about having an MRI and a mammogram every year.  Breast cancer gene (BRCA) assessment is recommended for women who have family members with BRCA-related cancers. BRCA-related cancers  include: ? Breast. ? Ovarian. ? Tubal. ? Peritoneal cancers.  Results of the assessment will determine the need for genetic counseling and BRCA1 and BRCA2 testing.  Cervical Cancer Your health care provider may recommend that you be screened regularly for cancer of the pelvic organs (ovaries, uterus, and vagina). This screening involves a pelvic examination, including checking for microscopic changes to the surface of your cervix (Pap test). You may be encouraged to have this screening done every 3 years, beginning at age 3.  For women ages 27-65, health care providers may recommend pelvic exams and Pap testing every 3 years, or they may recommend the Pap and pelvic exam, combined with testing for human papilloma virus (HPV), every 5 years. Some types of HPV increase your risk of cervical cancer. Testing for HPV may also be done on women of any age with unclear Pap test results.  Other health care providers may not recommend any screening for nonpregnant women who are considered low risk for pelvic cancer and who do not have symptoms. Ask your health care provider if a screening pelvic exam is right for you.  If you have had past treatment for cervical cancer or a condition that could lead to cancer, you need Pap tests and screening for cancer for at least 20 years after your treatment. If Pap tests have been discontinued, your risk factors (such as having a new sexual partner) need to be reassessed to determine if screening should resume. Some women have medical problems that increase the chance of getting cervical cancer. In these cases, your health care provider may recommend more frequent screening and Pap tests.  Colorectal Cancer  This type of cancer can be detected and often prevented.  Routine colorectal cancer screening usually begins at 74 years of age and continues through 74 years of age.  Your health care provider may recommend screening at an earlier age if you have risk factors  for colon cancer.  Your health care provider may also recommend using home test kits to check for hidden blood in the stool.  A small camera at the end of a tube can be used to examine your colon directly (sigmoidoscopy or colonoscopy). This is done to check for the earliest forms of colorectal cancer.  Routine screening usually begins at age 27.  Direct examination of the colon should be repeated every 5-10 years through 74 years of age. However, you may need to be screened more often if early forms of precancerous polyps or small growths are found.  Skin Cancer  Check your skin from head to toe regularly.  Tell your health care provider about any new moles or changes in moles, especially if there is a change in a mole's shape or color.  Also tell your health care provider if you have a mole that is larger than the size of a pencil eraser.  Always use sunscreen. Apply sunscreen liberally and repeatedly throughout the day.  Protect yourself by wearing long sleeves, pants, a wide-brimmed hat, and sunglasses whenever you are outside.  Heart disease, diabetes, and high blood pressure  High blood pressure causes heart disease and increases the risk of stroke. High blood pressure is more likely to develop in: ? People who have blood pressure in the high end of the normal range (130-139/85-89 mm Hg). ? People who are overweight or obese. ? People who are African American.  If you are 57-39 years of age, have your blood pressure checked every 3-5 years. If you are 19 years of age or older, have your blood pressure checked every year. You should have your blood pressure measured twice-once when you are at a hospital or clinic, and once when you are not at a hospital or clinic. Record the average of the two measurements. To check your blood pressure when you are not at a hospital or clinic, you can use: ? An automated blood pressure machine at a pharmacy. ? A home blood pressure monitor.  If  you are between 19 years and 38 years old, ask your health care provider if you should take aspirin to prevent strokes.  Have regular diabetes screenings. This involves taking a blood sample to check your fasting blood sugar level. ? If you are at a normal weight and have a low risk for diabetes, have this test once every three years after 74 years of age. ? If you are overweight and have a high risk for diabetes, consider being tested at a younger age or more often. Preventing infection Hepatitis B  If you have a higher risk for hepatitis B, you should be screened for this virus. You are considered at high risk for hepatitis B if: ? You were born in a country where hepatitis B is common. Ask your health care provider which countries are considered high risk. ? Your parents were born in a high-risk country, and you have not been immunized against hepatitis B (hepatitis B vaccine). ? You have HIV or AIDS. ? You use needles to inject street drugs. ? You live with someone who has hepatitis B. ? You have had sex with someone who has hepatitis B. ? You get hemodialysis treatment. ? You take certain medicines for conditions, including cancer, organ transplantation, and autoimmune conditions.  Hepatitis C  Blood testing is recommended for: ? Everyone born from [birth year] through 1967. ? Anyone with known risk factors for hepatitis C.  Sexually transmitted infections (STIs)  You should be screened for sexually transmitted infections (STIs) including gonorrhea and chlamydia if: ? You are sexually active and are younger than 74 years of age. ? You are older than 74 years of age and your health care provider tells you that you are at risk for this type of infection. ? Your sexual activity has changed since you were last screened and you are at an increased risk for chlamydia or gonorrhea. Ask your health care provider if you are at risk.  If you do not have HIV, but are at risk, it may be recommended  that you take a prescription medicine daily to prevent HIV infection. This is called pre-exposure prophylaxis (PrEP). You are considered at risk if: ? You are sexually active and do not regularly use condoms or know the HIV status of your partner(s). ? You take drugs by injection. ? You are sexually active with a partner who has HIV.  Talk with your health care provider about whether you are at high risk of  being infected with HIV. If you choose to begin PrEP, you should first be tested for HIV. You should then be tested every 3 months for as long as you are taking PrEP. Pregnancy  If you are premenopausal and you may become pregnant, ask your health care provider about preconception counseling.  If you may become pregnant, take 400 to 800 micrograms (mcg) of folic acid every day.  If you want to prevent pregnancy, talk to your health care provider about birth control (contraception). Osteoporosis and menopause  Osteoporosis is a disease in which the bones lose minerals and strength with aging. This can result in serious bone fractures. Your risk for osteoporosis can be identified using a bone density scan.  If you are 63 years of age or older, or if you are at risk for osteoporosis and fractures, ask your health care provider if you should be screened.  Ask your health care provider whether you should take a calcium or vitamin D supplement to lower your risk for osteoporosis.  Menopause may have certain physical symptoms and risks.  Hormone replacement therapy may reduce some of these symptoms and risks. Talk to your health care provider about whether hormone replacement therapy is right for you. Follow these instructions at home:  Schedule regular health, dental, and eye exams.  Stay current with your immunizations.  Do not use any tobacco products including cigarettes, chewing tobacco, or electronic cigarettes.  If you are pregnant, do not drink alcohol.  If you are  breastfeeding, limit how much and how often you drink alcohol.  Limit alcohol intake to no more than 1 drink per day for nonpregnant women. One drink equals 12 ounces of beer, 5 ounces of wine, or 1 ounces of hard liquor.  Do not use street drugs.  Do not share needles.  Ask your health care provider for help if you need support or information about quitting drugs.  Tell your health care provider if you often feel depressed.  Tell your health care provider if you have ever been abused or do not feel safe at home. This information is not intended to replace advice given to you by your health care provider. Make sure you discuss any questions you have with your health care provider. Document Released: 03/02/2011 Document Revised: 01/23/2016 Document Reviewed: 05/21/2015 Elsevier Interactive Patient Education  [employer].

## 2017-10-08 NOTE — Assessment & Plan Note (Signed)
Advanced directives: has not set up but has discussed with children. HCPOA - children. Packet provided today. 

## 2017-10-08 NOTE — Progress Notes (Signed)
BP 136/78 (BP Location: Left Arm, Patient Position: Sitting, Cuff Size: Normal)   Pulse 70   Temp 98.3 F (36.8 C) (Oral)   Ht 4\' 10"  (1.473 m)   Wt 124 lb (56.2 kg)   SpO2 100%   BMI 25.92 kg/m    CC: CPE Subjective:    Patient ID: Brandy Young, female    DOB: 11-28-1945, 72 y.o.   MRN: [id number]  HPI: Brandy Young is a 72 y.o. female presenting on 10/08/2017 for Annual Exam (Pt 2.)   Saw Virl Axe last week for medicare wellness visit. Note reviewed.  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  Magnesium 250mg  daily has helped leg cramping Omeprazole 40mg  daily has significantly helped. Upcoming glaucoma surgery Monday ([place]) Ongoing R shoulder pain - will f/u with Dr Ave Filter.   Preventative: COLONOSCOPY Date: 08/2011 diverticulosis, int hem (Iftikhar) Well woman exam - s/p hysterectomy, ovaries remain. Aged out. Mammogram - 01/2016 - R breast mass, rec rpt 6 mo. Mammogram 02/2017 stable findings - rec rpt 1 yr.  DEXA in [place] >7 yrs ago - normal. No records available. Rpt dexa sheduled. Flu shot - yearly Tdap 2014 Prevnar 07/2014. Pneumovax 2017.  zostavax 2010.  shingrix - 05/2017, rpt due Advanced directives: has not set up but has discussed with children. HCPOA - children. Packet provided today. Seat belt use discussed Sunscreen use discussed. No changing moles on skin.  Non smoker  Alcohol - none  Lives alone, 1 dog. 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 Interim medical history since our last visit reviewed. Allergies and medications reviewed and updated. Outpatient Medications Prior to Visit  Medication Sig Dispense Refill  . calcium carbonate (OS-CAL) 600 MG TABS tablet Take 600 mg by mouth 2  (two) times daily with a meal.    . cetirizine (ZYRTEC) 10 MG tablet Take 10 mg by mouth daily.    . ferrous sulfate 325 (65 FE) MG tablet Take 1 tablet (325 mg total) by mouth daily with breakfast.  3  . HYDROcodone-acetaminophen (NORCO) 7.5-325 MG per tablet Take 1 tablet by mouth 3 (three) times daily as needed for moderate pain. (Dr Yves Dill)    . ibuprofen (ADVIL,MOTRIN) 200 MG tablet Take 200 mg by mouth every 6 (six) hours as needed.    Marland Kitchen lisinopril (PRINIVIL,ZESTRIL) 10 MG tablet Take 1 tablet (10 mg total) by mouth daily. 90 tablet 3  . Magnesium 250 MG TABS Take 1 tablet by mouth 2 (two) times daily.    . Omega-3 Fatty Acids (FISH OIL) 1200 MG CAPS Take 1 capsule by mouth 2 (two) times daily.    Marland Kitchen omeprazole (PRILOSEC) 40 MG capsule Take 1 capsule (40 mg total) by mouth daily. 30 capsule 6  . psyllium (METAMUCIL) 58.6 % powder Take 1 packet by mouth daily.    Marland Kitchen senna-docusate (SENOKOT-S) 8.6-50 MG tablet Take 1 tablet by mouth at bedtime as needed for mild constipation. (Patient taking differently: Take 1 tablet by mouth at bedtime. ) 30 tablet 0  . vitamin B-12 (CYANOCOBALAMIN) 500 MCG tablet Take 1 tablet (500 mcg total) by mouth daily.    Marland Kitchen MAGNESIUM PO Take 250 mg by mouth 2 (two) times daily.    . simvastatin (ZOCOR) 10 MG tablet TAKE 1 TABLET  BY MOUTH  DAILY 90 tablet 1   No facility-administered medications prior to visit.      Per HPI unless specifically indicated in ROS section below Review of Systems  Constitutional: Negative for activity change, appetite change, chills, fatigue, fever and unexpected weight change.  HENT: Negative for hearing loss.   Eyes: Negative for visual disturbance.  Respiratory: Negative for cough, chest tightness, shortness of breath and wheezing.   Cardiovascular: Positive for leg swelling. Negative for chest pain and palpitations.  Gastrointestinal: Negative for abdominal distention, abdominal pain, blood in stool, constipation, diarrhea, nausea  and vomiting.  Genitourinary: Negative for difficulty urinating and hematuria.  Musculoskeletal: Negative for arthralgias, myalgias and neck pain.  Skin: Negative for rash.  Neurological: Negative for dizziness, seizures, syncope and headaches.  Hematological: Negative for adenopathy. Does not bruise/bleed easily.  Psychiatric/Behavioral: Negative for dysphoric mood. The patient is not nervous/anxious.        Objective:    BP 136/78 (BP Location: Left Arm, Patient Position: Sitting, Cuff Size: Normal)   Pulse 70   Temp 98.3 F (36.8 C) (Oral)   Ht 4\' 10"  (1.473 m)   Wt 124 lb (56.2 kg)   SpO2 100%   BMI 25.92 kg/m   Wt Readings from Last 3 Encounters:  10/08/17 124 lb (56.2 kg)  10/04/17 119 lb 12 oz (54.3 kg)  08/02/17 124 lb 4 oz (56.4 kg)    Physical Exam  Constitutional: She is oriented to person, place, and time. She appears well-developed and well-nourished. No distress.  HENT:  Head: Normocephalic and atraumatic.  Right Ear: Hearing, tympanic membrane, external ear and ear canal normal.  Left Ear: Hearing, tympanic membrane, external ear and ear canal normal.  Nose: Nose normal.  Mouth/Throat: Uvula is midline, oropharynx is clear and moist and mucous membranes are normal. No oropharyngeal exudate, posterior oropharyngeal edema or posterior oropharyngeal erythema.  Eyes: Conjunctivae and EOM are normal. Pupils are equal, round, and reactive to light. No scleral icterus.  Neck: Normal range of motion. Neck supple. Carotid bruit is not present. No thyromegaly present.  Cardiovascular: Normal rate, regular rhythm, normal heart sounds and intact distal pulses.  No murmur heard. Pulses:      Radial pulses are 2+ on the right side, and 2+ on the left side.  Pulmonary/Chest: Effort normal and breath sounds normal. No respiratory distress. She has no wheezes. She has no rales.  Abdominal: Soft. Bowel sounds are normal. She exhibits no distension and no mass. There is no  tenderness. There is no rebound and no guarding.  Musculoskeletal: Normal range of motion. She exhibits no edema.  Lymphadenopathy:    She has no cervical adenopathy.  Neurological: She is alert and oriented to person, place, and time.  CN grossly intact, station and gait intact  Skin: Skin is warm and dry. No rash noted.  Psychiatric: She has a normal mood and affect. Her behavior is normal. Judgment and thought content normal.  Nursing note and vitals reviewed.  Results for orders placed or performed in visit on 10/04/17  Lipid panel  Result Value Ref Range   Cholesterol 206 (H) 0 - 200 mg/dL   Triglycerides 40.9 0.0 - 149.0 mg/dL   HDL 81.19 >14.78 mg/dL   VLDL 29.5 0.0 - 62.1 mg/dL   LDL Cholesterol 96 0 - 99 mg/dL   Total CHOL/HDL Ratio 2    NonHDL 115.40   Hemoglobin A1c  Result Value Ref Range   Hgb A1c MFr Bld 6.1 4.6 -  6.5 %   Lab Results  Component Value Date   VITAMINB12 1,162 (H) 01/29/2017       Assessment & Plan:   Problem List Items Addressed This Visit    Abnormal mammogram    Reviewed latest mammo/US with patient. Will be due for next mammogram 02/2018. I have sent myself a reminder and asked her to contact us if we have not ordered by then.      Advanced care planning/counseling discussion    Advanced directives: has not set up but has discussed with children. HCPOA - children. Packet provided today.      Chronic constipation    Improved on daily omeprazole 40mg  daily. Continue this along with sennakot and metamucil.      Glaucoma    Sees Dr Inez PilgrimBrasington - upcoming procedure next week.      Health maintenance examination - Primary    Preventative protocols reviewed and updated unless pt declined. Discussed healthy diet and lifestyle.       HLD (hyperlipidemia)    Chronic, stable on simvastatin and fish oil. Continue. Largely driven by high HDL. The 10-year ASCVD risk score Denman George(Goff DC Montez HagemanJr., et al., 2013) is: 18.7%   Values used to calculate the  score:     Age: [age] years     Sex: Female     Is Non-Hispanic African American: No     Diabetic: No     Tobacco smoker: No     Systolic Blood Pressure: 136 mmHg     Is BP treated: Yes     HDL Cholesterol: 90.4 mg/dL     Total Cholesterol: 206 mg/dL       Relevant Medications   simvastatin (ZOCOR) 10 MG tablet   HTN (hypertension)    Chronic, stable. Continue current regimen.       Relevant Medications   simvastatin (ZOCOR) 10 MG tablet   IDA (iron deficiency anemia)    Doing well with daily ferrous sulfate. S/p EGD/colonoscopy 2016.  Recheck iron panel next labs to see if iron supplementation still needed.       Lumbar stenosis with neurogenic claudication    Chronic, stable followed by PM&R      Prediabetes    Reviewed with patient, avoid added sugars.          Follow up plan: No Follow-up on file.  Eustaquio BoydenJavier Ramonita Koenig, MD

## 2017-10-08 NOTE — Assessment & Plan Note (Signed)
Improved on daily omeprazole 40mg  daily. Continue this along with sennakot and metamucil.

## 2017-10-08 NOTE — Assessment & Plan Note (Addendum)
Chronic, stable followed by PM&R

## 2017-10-08 NOTE — Assessment & Plan Note (Signed)
Reviewed latest mammo/US with patient. Will be due for next mammogram 02/2018. I have sent myself a reminder and asked her to contact us if we have not ordered by then.

## 2017-10-08 NOTE — Assessment & Plan Note (Addendum)
Doing well with daily ferrous sulfate. S/p EGD/colonoscopy 2016.  Recheck iron panel next labs to see if iron supplementation still needed.

## 2017-10-08 NOTE — Assessment & Plan Note (Signed)
Sees Dr Inez PilgrimBrasington - upcoming procedure next week.

## 2017-10-08 NOTE — Assessment & Plan Note (Signed)
Chronic, stable. Continue current regimen. 

## 2017-10-15 DIAGNOSIS — H401113 Primary open-angle glaucoma, right eye, severe stage: Secondary | ICD-10-CM | POA: Diagnosis not present

## 2017-10-29 ENCOUNTER — Encounter
Admission: EM | Disposition: A | Discharge status: Home / Self Care | Payer: Self-pay | Source: Home / Self Care | Attending: Urology

## 2017-10-29 ENCOUNTER — Emergency Department: Payer: Medicare Other

## 2017-10-29 ENCOUNTER — Emergency Department: Payer: Medicare Other | Admitting: Anesthesiology

## 2017-10-29 ENCOUNTER — Other Ambulatory Visit: Payer: Self-pay

## 2017-10-29 ENCOUNTER — Encounter: Payer: Self-pay | Admitting: Emergency Medicine

## 2017-10-29 ENCOUNTER — Ambulatory Visit: Payer: Self-pay | Admitting: Urology

## 2017-10-29 ENCOUNTER — Inpatient Hospital Stay
Admission: EM | Admit: 2017-10-29 | Discharge: 2017-11-01 | DRG: 853 | Disposition: A | Discharge status: Home / Self Care | Payer: Medicare Other | Attending: Urology | Admitting: Urology

## 2017-10-29 DIAGNOSIS — N201 Calculus of ureter: Secondary | ICD-10-CM | POA: Diagnosis present

## 2017-10-29 DIAGNOSIS — N132 Hydronephrosis with renal and ureteral calculous obstruction: Secondary | ICD-10-CM | POA: Diagnosis not present

## 2017-10-29 DIAGNOSIS — Z888 Allergy status to other drugs, medicaments and biological substances status: Secondary | ICD-10-CM

## 2017-10-29 DIAGNOSIS — R8271 Bacteriuria: Secondary | ICD-10-CM | POA: Diagnosis not present

## 2017-10-29 DIAGNOSIS — M1710 Unilateral primary osteoarthritis, unspecified knee: Secondary | ICD-10-CM | POA: Diagnosis present

## 2017-10-29 DIAGNOSIS — R6521 Severe sepsis with septic shock: Secondary | ICD-10-CM | POA: Diagnosis present

## 2017-10-29 DIAGNOSIS — R109 Unspecified abdominal pain: Secondary | ICD-10-CM | POA: Diagnosis present

## 2017-10-29 DIAGNOSIS — T8859XA Other complications of anesthesia, initial encounter: Secondary | ICD-10-CM

## 2017-10-29 DIAGNOSIS — B9620 Unspecified Escherichia coli [E. coli] as the cause of diseases classified elsewhere: Secondary | ICD-10-CM | POA: Diagnosis present

## 2017-10-29 DIAGNOSIS — I9581 Postprocedural hypotension: Secondary | ICD-10-CM | POA: Diagnosis not present

## 2017-10-29 DIAGNOSIS — Z79899 Other long term (current) drug therapy: Secondary | ICD-10-CM

## 2017-10-29 DIAGNOSIS — D649 Anemia, unspecified: Secondary | ICD-10-CM | POA: Diagnosis not present

## 2017-10-29 DIAGNOSIS — E872 Acidosis: Secondary | ICD-10-CM | POA: Diagnosis not present

## 2017-10-29 DIAGNOSIS — H409 Unspecified glaucoma: Secondary | ICD-10-CM | POA: Diagnosis present

## 2017-10-29 DIAGNOSIS — E785 Hyperlipidemia, unspecified: Secondary | ICD-10-CM | POA: Diagnosis present

## 2017-10-29 DIAGNOSIS — K449 Diaphragmatic hernia without obstruction or gangrene: Secondary | ICD-10-CM | POA: Diagnosis not present

## 2017-10-29 DIAGNOSIS — N200 Calculus of kidney: Secondary | ICD-10-CM

## 2017-10-29 DIAGNOSIS — I071 Rheumatic tricuspid insufficiency: Secondary | ICD-10-CM | POA: Diagnosis not present

## 2017-10-29 DIAGNOSIS — M48062 Spinal stenosis, lumbar region with neurogenic claudication: Secondary | ICD-10-CM | POA: Diagnosis not present

## 2017-10-29 DIAGNOSIS — N390 Urinary tract infection, site not specified: Secondary | ICD-10-CM | POA: Diagnosis present

## 2017-10-29 DIAGNOSIS — I10 Essential (primary) hypertension: Secondary | ICD-10-CM | POA: Diagnosis not present

## 2017-10-29 DIAGNOSIS — R6520 Severe sepsis without septic shock: Secondary | ICD-10-CM | POA: Diagnosis not present

## 2017-10-29 DIAGNOSIS — A419 Sepsis, unspecified organism: Principal | ICD-10-CM | POA: Diagnosis present

## 2017-10-29 DIAGNOSIS — I959 Hypotension, unspecified: Secondary | ICD-10-CM | POA: Diagnosis not present

## 2017-10-29 DIAGNOSIS — N1330 Unspecified hydronephrosis: Secondary | ICD-10-CM | POA: Diagnosis not present

## 2017-10-29 HISTORY — DX: Other complications of anesthesia, initial encounter: T88.59XA

## 2017-10-29 HISTORY — PX: CYSTOSCOPY W/ RETROGRADES: SHX1426

## 2017-10-29 HISTORY — PX: CYSTOSCOPY WITH STENT PLACEMENT: SHX5790

## 2017-10-29 LAB — BASIC METABOLIC PANEL
Anion gap: 11 (ref 5–15)
BUN: 27 mg/dL — ABNORMAL HIGH (ref 6–20)
CALCIUM: 9 mg/dL (ref 8.9–10.3)
CO2: 19 mmol/L — ABNORMAL LOW (ref 22–32)
CREATININE: 0.78 mg/dL (ref 0.44–1.00)
Chloride: 110 mmol/L (ref 101–111)
GFR calc non Af Amer: >60 mL/min (ref >60)
Glucose, Bld: 149 mg/dL — ABNORMAL HIGH (ref 65–99)
Potassium: 3.3 mmol/L — ABNORMAL LOW (ref 3.5–5.1)
SODIUM: 140 mmol/L (ref 135–145)

## 2017-10-29 LAB — URINALYSIS, COMPLETE (UACMP) WITH MICROSCOPIC
Bilirubin Urine: NEGATIVE
GLUCOSE, UA: NEGATIVE mg/dL
Ketones, ur: NEGATIVE mg/dL
Leukocytes, UA: NEGATIVE
NITRITE: POSITIVE — AB
PH: 5 (ref 5.0–8.0)
Protein, ur: NEGATIVE mg/dL
SPECIFIC GRAVITY, URINE: 1.017 (ref 1.005–1.030)
Squamous Epithelial / LPF: NONE SEEN

## 2017-10-29 LAB — CBC
HCT: 35.1 % (ref 35.0–47.0)
HEMOGLOBIN: 11.9 g/dL — AB (ref 12.0–16.0)
MCH: 31.4 pg (ref 26.0–34.0)
MCHC: 34 g/dL (ref 32.0–36.0)
MCV: 92.3 fL (ref 80.0–100.0)
Platelets: 230 10*3/uL (ref 150–440)
RBC: 3.81 MIL/uL (ref 3.80–5.20)
RDW: 13.2 % (ref 11.5–14.5)
WBC: 5.6 10*3/uL (ref 3.6–11.0)

## 2017-10-29 SURGERY — CYSTOSCOPY, WITH STENT INSERTION
Anesthesia: General | Site: Ureter | Laterality: Left | Wound class: Clean Contaminated

## 2017-10-29 MED ORDER — GLYCOPYRROLATE 0.2 MG/ML IJ SOLN
INTRAMUSCULAR | Status: AC
  Filled 2017-10-29: qty 1

## 2017-10-29 MED ORDER — ONDANSETRON HCL 4 MG/2ML IJ SOLN
INTRAMUSCULAR | Status: AC
  Filled 2017-10-29: qty 2

## 2017-10-29 MED ORDER — ONDANSETRON HCL 4 MG/2ML IJ SOLN
INTRAMUSCULAR | Status: DC | PRN
  Administered 2017-10-29: 4 mg via INTRAVENOUS

## 2017-10-29 MED ORDER — SENNOSIDES-DOCUSATE SODIUM 8.6-50 MG PO TABS: 1.0000 | ORAL_TABLET | Freq: Every evening | ORAL | Status: DC | PRN

## 2017-10-29 MED ORDER — HYDROMORPHONE HCL 1 MG/ML IJ SOLN
INTRAMUSCULAR | Status: DC | PRN
  Administered 2017-10-29: 1 mg via INTRAVENOUS

## 2017-10-29 MED ORDER — CEFTRIAXONE SODIUM 1 G IJ SOLR
1.0000 g | Freq: Once | INTRAMUSCULAR | Status: AC
  Administered 2017-10-29: 1 g via INTRAVENOUS
  Filled 2017-10-29: qty 10

## 2017-10-29 MED ORDER — SUCCINYLCHOLINE CHLORIDE 20 MG/ML IJ SOLN
INTRAMUSCULAR | Status: DC | PRN
  Administered 2017-10-29: 120 mg via INTRAVENOUS

## 2017-10-29 MED ORDER — MORPHINE SULFATE (PF) 2 MG/ML IV SOLN
2.0000 mg | INTRAVENOUS | Status: DC | PRN
  Administered 2017-10-30: 2 mg via INTRAVENOUS
  Filled 2017-10-29: qty 1

## 2017-10-29 MED ORDER — EPHEDRINE SULFATE 50 MG/ML IJ SOLN
INTRAMUSCULAR | Status: AC
  Filled 2017-10-29: qty 1

## 2017-10-29 MED ORDER — MORPHINE SULFATE (PF) 4 MG/ML IV SOLN
4.0000 mg | Freq: Once | INTRAVENOUS | Status: AC
  Administered 2017-10-29: 4 mg via INTRAVENOUS
  Filled 2017-10-29: qty 1

## 2017-10-29 MED ORDER — PHENYLEPHRINE HCL 10 MG/ML IJ SOLN
INTRAMUSCULAR | Status: AC
  Filled 2017-10-29: qty 1

## 2017-10-29 MED ORDER — MAGNESIUM 200 MG PO TABS
1.0000 | ORAL_TABLET | Freq: Two times a day (BID) | ORAL | Status: DC
  Administered 2017-10-29 – 2017-10-30 (×2): 200 mg via ORAL
  Filled 2017-10-29 (×5): qty 1

## 2017-10-29 MED ORDER — HYDROCODONE-ACETAMINOPHEN 7.5-325 MG PO TABS
1.0000 | ORAL_TABLET | Freq: Three times a day (TID) | ORAL | Status: DC | PRN
  Administered 2017-10-29 – 2017-10-31 (×5): 1 via ORAL
  Filled 2017-10-29 (×5): qty 1

## 2017-10-29 MED ORDER — FENTANYL CITRATE (PF) 100 MCG/2ML IJ SOLN: 25.0000 ug | INTRAMUSCULAR | Status: DC | PRN

## 2017-10-29 MED ORDER — ONDANSETRON HCL 4 MG/2ML IJ SOLN
4.0000 mg | Freq: Once | INTRAMUSCULAR | Status: AC
  Administered 2017-10-29: 4 mg via INTRAVENOUS
  Filled 2017-10-29: qty 2

## 2017-10-29 MED ORDER — SODIUM CHLORIDE 0.9 % IV BOLUS (SEPSIS)
1000.0000 mL | Freq: Once | INTRAVENOUS | Status: AC
  Administered 2017-10-29: 1000 mL via INTRAVENOUS

## 2017-10-29 MED ORDER — ROCURONIUM BROMIDE 100 MG/10ML IV SOLN
INTRAVENOUS | Status: DC | PRN
  Administered 2017-10-29: 10 mg via INTRAVENOUS

## 2017-10-29 MED ORDER — HYDROMORPHONE HCL 1 MG/ML IJ SOLN
INTRAMUSCULAR | Status: AC
  Filled 2017-10-29: qty 1

## 2017-10-29 MED ORDER — PROPOFOL 10 MG/ML IV BOLUS
INTRAVENOUS | Status: DC | PRN
  Administered 2017-10-29: 130 mg via INTRAVENOUS

## 2017-10-29 MED ORDER — PROPOFOL 10 MG/ML IV BOLUS
INTRAVENOUS | Status: AC
  Filled 2017-10-29: qty 20

## 2017-10-29 MED ORDER — LIDOCAINE HCL (CARDIAC) 20 MG/ML IV SOLN
INTRAVENOUS | Status: DC | PRN
  Administered 2017-10-29: 50 mg via INTRAVENOUS

## 2017-10-29 MED ORDER — SODIUM CHLORIDE 0.9 % IV SOLN
1.0000 g | INTRAVENOUS | Status: DC
  Administered 2017-10-30 – 2017-10-31 (×2): 1 g via INTRAVENOUS
  Filled 2017-10-29 (×2): qty 10

## 2017-10-29 MED ORDER — LACTATED RINGERS IV SOLN
INTRAVENOUS | Status: DC | PRN
  Administered 2017-10-29 (×2): via INTRAVENOUS

## 2017-10-29 MED ORDER — PHENYLEPHRINE HCL 10 MG/ML IJ SOLN
INTRAMUSCULAR | Status: DC | PRN
  Administered 2017-10-29 (×2): 100 ug via INTRAVENOUS
  Administered 2017-10-29: 200 ug via INTRAVENOUS
  Administered 2017-10-29: 100 ug via INTRAVENOUS
  Administered 2017-10-29 (×3): 200 ug via INTRAVENOUS

## 2017-10-29 MED ORDER — EPHEDRINE SULFATE 50 MG/ML IJ SOLN
INTRAMUSCULAR | Status: DC | PRN
  Administered 2017-10-29 (×2): 10 mg via INTRAVENOUS

## 2017-10-29 MED ORDER — PANTOPRAZOLE SODIUM 40 MG PO TBEC
40.0000 mg | DELAYED_RELEASE_TABLET | Freq: Every day | ORAL | Status: DC
  Administered 2017-10-29 – 2017-11-01 (×4): 40 mg via ORAL
  Filled 2017-10-29 (×5): qty 1

## 2017-10-29 MED ORDER — SUCCINYLCHOLINE CHLORIDE 20 MG/ML IJ SOLN
INTRAMUSCULAR | Status: AC
  Filled 2017-10-29: qty 1

## 2017-10-29 MED ORDER — LIDOCAINE HCL (PF) 2 % IJ SOLN
INTRAMUSCULAR | Status: AC
  Filled 2017-10-29: qty 10

## 2017-10-29 MED ORDER — SODIUM CHLORIDE 0.9 % IV SOLN
0.0000 ug/min | INTRAVENOUS | Status: DC
  Administered 2017-10-30: 10 ug/min via INTRAVENOUS
  Administered 2017-10-30: 20 ug/min via INTRAVENOUS
  Administered 2017-10-30: 25 ug/min via INTRAVENOUS
  Filled 2017-10-29 (×2): qty 10
  Filled 2017-10-29 (×2): qty 1

## 2017-10-29 MED ORDER — DEXAMETHASONE SODIUM PHOSPHATE 10 MG/ML IJ SOLN
INTRAMUSCULAR | Status: AC
  Filled 2017-10-29: qty 1

## 2017-10-29 MED ORDER — DEXAMETHASONE SODIUM PHOSPHATE 10 MG/ML IJ SOLN
INTRAMUSCULAR | Status: DC | PRN
  Administered 2017-10-29: 10 mg via INTRAVENOUS

## 2017-10-29 MED ORDER — IOTHALAMATE MEGLUMINE 43 % IV SOLN
INTRAVENOUS | Status: DC | PRN
  Administered 2017-10-29: 15 mL via URETHRAL

## 2017-10-29 MED ORDER — ROCURONIUM BROMIDE 50 MG/5ML IV SOLN
INTRAVENOUS | Status: AC
  Filled 2017-10-29: qty 1

## 2017-10-29 MED ORDER — IBUPROFEN 400 MG PO TABS
200.0000 mg | ORAL_TABLET | Freq: Four times a day (QID) | ORAL | Status: DC | PRN
  Administered 2017-10-29: 200 mg via ORAL
  Filled 2017-10-29: qty 1

## 2017-10-29 MED ORDER — KETOROLAC TROMETHAMINE 30 MG/ML IJ SOLN
30.0000 mg | Freq: Once | INTRAMUSCULAR | Status: AC
  Administered 2017-10-29: 30 mg via INTRAVENOUS
  Filled 2017-10-29: qty 1

## 2017-10-29 MED ORDER — LACTATED RINGERS IV SOLN: INTRAVENOUS | Status: DC

## 2017-10-29 MED ORDER — LISINOPRIL 5 MG PO TABS: 10.0000 mg | ORAL_TABLET | Freq: Every day | ORAL | Status: DC

## 2017-10-29 MED ORDER — OXYBUTYNIN CHLORIDE 5 MG PO TABS
5.0000 mg | ORAL_TABLET | Freq: Three times a day (TID) | ORAL | Status: DC | PRN
  Filled 2017-10-29: qty 1

## 2017-10-29 MED ORDER — PSYLLIUM 95 % PO PACK
1.0000 | PACK | Freq: Every day | ORAL | Status: DC
  Administered 2017-10-29 – 2017-10-30 (×2): 1 via ORAL
  Filled 2017-10-29 (×4): qty 1

## 2017-10-29 MED ORDER — ONDANSETRON HCL 4 MG/2ML IJ SOLN: 4.0000 mg | Freq: Once | INTRAMUSCULAR | Status: DC | PRN

## 2017-10-29 SURGICAL SUPPLY — 22 items
BAG DRAIN CYSTO-URO LG1000N (MISCELLANEOUS) ×2 IMPLANT
BRUSH SCRUB EZ  4% CHG (MISCELLANEOUS) ×1
BRUSH SCRUB EZ 4% CHG (MISCELLANEOUS) ×1 IMPLANT
CATH URETL 5X70 OPEN END (CATHETERS) ×2 IMPLANT
CONRAY 43 FOR UROLOGY 50M (MISCELLANEOUS) ×2 IMPLANT
GLIDEWIRE STR 0.035 150CM 3CM (WIRE) ×2 IMPLANT
GLOVE BIOGEL PI IND STRL 8 (GLOVE) ×1 IMPLANT
GLOVE BIOGEL PI INDICATOR 8 (GLOVE) ×1
GOWN STANDARD XL  REUSABL (MISCELLANEOUS) ×2 IMPLANT
KIT TURNOVER CYSTO (KITS) ×2 IMPLANT
PACK CYSTO AR (MISCELLANEOUS) ×2 IMPLANT
SENSORWIRE 0.038 NOT ANGLED (WIRE) ×2
SET CYSTO W/LG BORE CLAMP LF (SET/KITS/TRAYS/PACK) ×2 IMPLANT
SOL .9 NS 3000ML IRR  AL (IV SOLUTION) ×1
SOL .9 NS 3000ML IRR UROMATIC (IV SOLUTION) ×1 IMPLANT
STENT URET 6FRX22 CONTOUR (STENTS) ×2 IMPLANT
STENT URET 6FRX24 CONTOUR (STENTS) IMPLANT
STENT URET 6FRX26 CONTOUR (STENTS) IMPLANT
SURGILUBE 2OZ TUBE FLIPTOP (MISCELLANEOUS) ×2 IMPLANT
SYRINGE IRR TOOMEY STRL 70CC (SYRINGE) IMPLANT
WATER STERILE IRR 1000ML POUR (IV SOLUTION) ×2 IMPLANT
WIRE SENSOR 0.038 NOT ANGLED (WIRE) ×1 IMPLANT

## 2017-10-29 NOTE — Op Note (Signed)
Preoperative diagnosis:  1. Obstructing left distal ureteral calculus with infection  Postoperative diagnosis:  1. Obstructing left distal ureteral calculus with infection  Procedure:  1. Cystoscopy 2. Left ureteral stent placement 3. Left retrograde pyelography with interpretation   Surgeon: Lorin PicketScott C. Kyl Givler, M.D.  Anesthesia: General  Complications: None  Intraoperative findings: Left retrograde pyelogram demonstrates moderate left hydronephrosis and hydroureter.  EBL: Minimal  Specimens: None  Indication: Brandy Daneatricia Dupuis Antrobus is a 72 y.o. patient with onset of severe left renal colic at 2 AM this morning.  She complains of malaise, nausea but no fever.  Urinalysis was nitrite positive with bacteriuria.  Her pain was not controlled in the ED.  Urgent stent placement was recommended. After reviewing the management options for treatment, she elected to proceed with the above surgical procedure(s). We have discussed the potential benefits and risks of the procedure, side effects of the proposed treatment, the likelihood of the patient achieving the goals of the procedure, and any potential problems that might occur during the procedure or recuperation. Informed consent has been obtained.  Description of procedure:  The patient was taken to the operating room and general anesthesia was induced.  The patient was placed in the dorsal lithotomy position, prepped and draped in the usual sterile fashion, and preoperative antibiotics were administered. A preoperative time-out was performed.   Cystourethroscopy was performed.  The urethra was normal in appearance.  The bladder was then systematically examined in its entirety. There was no evidence for any bladder tumors, stones, or other mucosal pathology.  No efflux was noted from the left ureteral orifice.  The right ureteral orifice was normal in appearance.  Attention then turned to the left ureteral orifice.  A 0.038 Sensor wire was  placed through a 5 [place] open-ended ureteral catheter positioned at the UO.  The wire was unable to be advanced due to the distal calculus.  Placement of a 0.038 Glidewire was also unsuccessful.  The cystoscope was removed and a 4.5 French semirigid ureteroscope was passed per urethra and positioned at the left ureteral orifice.  The Glidewire was placed through the ureteroscope and was able to be advanced to the left renal pelvis under fluoroscopic guidance.  The ureteroscope was removed.  The 5 [place] open-ended ureteral catheter was placed over the wire and advanced to the left renal pelvis.  Approximately 5 mL of slightly cloudy, amber urine was aspirated and sent for culture.  Left retrograde pyelogram was performed through the ureteral catheter with findings as described above.  The sensor wire was placed through the ureteral catheter which was removed.  The guidewire was backloaded on the cystoscope and a 6 French/22 cm double-J ureteral stent was placed without difficulty.  There was good curl seen in the renal pelvis on fluoroscopy and in the bladder under direct vision.  Brisk efflux of urine was noted from the stent after placement.  The bladder was emptied and the cystoscope was removed.  After anesthetic reversal patient was taken to PACU in stable condition.   Brandy AltesScott C Lennart Gladish, MD

## 2017-10-29 NOTE — ED Triage Notes (Addendum)
Here for left flank pain radiating to LLQ.  Had stone long time ago. No fevers. some nausea and vomit X 2 per pt, none now.  Unlabored. VSS. NAD. Color WNL.

## 2017-10-29 NOTE — Anesthesia Post-op Follow-up Note (Signed)
Anesthesia QCDR form completed.        

## 2017-10-29 NOTE — ED Provider Notes (Signed)
[facility] Emergency Department Provider Note  ___________________________________________   First MD Initiated Contact with Patient 10/29/17 [phone]     (approximate)  I have reviewed the triage vital signs and the nursing notes.   HISTORY  Chief Complaint Flank Pain   HPI Carmelina Daneatricia Dupuis Borman is a 72 y.o. female with a history of kidney stones was presenting with left flank pain that woke her up from sleep at 2 AM last night.  Says the pain is above a 10 out of 10 right now and sharp and cramping.  Says the pain is radiating around from her left flank to her left lower abdomen.  Denies fever.  Associated with nausea and vomiting x2 prior to arrival to the emergency department.  Past Medical History:  Diagnosis Date  . Allergic rhinitis due to pollen   . DDD (degenerative disc disease), lumbar    with spinal stenosis and neurogenic claudication s/p ESI (Chasnis)  . DJD (degenerative joint disease) of knee    failed steroid and synvisc injections  . Glaucoma   . History of chicken pox   . History of stomach ulcers 2010s  . HLD (hyperlipidemia)   . HTN (hypertension)   . IDA (iron deficiency anemia) 2012   s/p normal EGD/colonoscopy (iftikhar)  . 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 Prediabetes   . Tricuspid regurgitation 2012   by echo, EF 73% [place](Khan)    Patient Active Problem List   Diagnosis Date Noted  . Generalized abdominal pain 08/02/2017  . Weight loss 08/02/2017  . Acquired hammer toe of right foot 01/29/2017  . Left leg swelling 01/29/2017  . B12 deficiency 07/31/2016  . Chronic constipation 04/21/2016  . Abnormal mammogram 02/13/2016  . Syncope 07/09/2015  . Medicare annual wellness visit, subsequent 04/19/2015  . Health maintenance examination 04/19/2015  . Advanced care planning/counseling discussion 04/19/2015  . Prediabetes     . Allergic rhinitis due to pollen   . IDA (iron deficiency anemia)   . Glaucoma   . HTN (hypertension)   . HLD (hyperlipidemia)   . DDD (degenerative disc disease), lumbar   . DJD (degenerative joint disease) of knee   . Lumbar stenosis with neurogenic claudication 08/31/2014    Past Surgical History:  Procedure Laterality Date  . APPENDECTOMY    . COLONOSCOPY  08/2011   diverticulosis, int hem (Iftikhar)  . ESI  multiple, latest 03/2015   R L5/S1, L L3/4, L L4/5 L5/S1, L L4/5 and L5/S1 - minimal relief (Chasnis)  . ESOPHAGOGASTRODUODENOSCOPY  08/2011   WNL  . EYE SURGERY     laser eye surgery  . FOOT SURGERY Right   . VAGINAL HYSTERECTOMY  1980s   prolapsed uterus, ovaries remained    Prior to Admission medications   Medication Sig Start Date End Date Taking? Authorizing Provider  calcium carbonate (OS-CAL) 600 MG TABS tablet Take 600 mg by mouth 2 (two) times daily with a meal.   Yes [provider]  cetirizine (ZYRTEC) 10 MG tablet Take 10 mg by mouth daily.   Yes [provider]  ferrous sulfate 325 (65 FE) MG tablet Take 1 tablet (325 mg total) by mouth daily with breakfast. 01/30/17  Yes Eustaquio BoydenGutierrez, Javier, MD  HYDROcodone-acetaminophen [facility]) 7.5-325 MG per tablet Take 1 tablet by mouth 3 (three) times daily as needed for moderate pain. (Dr Yves Dillhasnis) 05/12/15  Yes Eustaquio BoydenGutierrez, Javier,  MD  lisinopril (PRINIVIL,ZESTRIL) 10 MG tablet Take 1 tablet (10 mg total) by mouth daily. 08/02/17  Yes Eustaquio Boyden, MD  Magnesium 250 MG TABS Take 1 tablet by mouth 2 (two) times daily.   Yes [provider]  Omega-3 Fatty Acids (FISH OIL) 1200 MG CAPS Take 1 capsule by mouth 2 (two) times daily.   Yes [provider]  omeprazole (PRILOSEC) 40 MG capsule Take 1 capsule (40 mg total) by mouth daily. 08/02/17  Yes Eustaquio Boyden, MD  psyllium (METAMUCIL) 58.6 % powder Take 1 packet by mouth daily.   Yes [provider]  senna-docusate (SENOKOT-S)  8.6-50 MG tablet Take 1 tablet by mouth at bedtime as needed for mild constipation. Patient taking differently: Take 1 tablet by mouth at bedtime.  04/21/16  Yes Eustaquio Boyden, MD  simvastatin (ZOCOR) 10 MG tablet Take 1 tablet (10 mg total) by mouth daily. 10/08/17  Yes Eustaquio Boyden, MD  vitamin B-12 (CYANOCOBALAMIN) 500 MCG tablet Take 1 tablet (500 mcg total) by mouth daily. 01/30/17  Yes Eustaquio Boyden, MD  ibuprofen (ADVIL,MOTRIN) 200 MG tablet Take 200 mg by mouth every 6 (six) hours as needed.    [provider]    Allergies Niacin and related and Neurontin [gabapentin]  Family History  Problem Relation Age of Onset  . Stroke Father 48       hemorrhagic bleed  . Hypertension Father   . Cancer Mother 61       breast  . Breast cancer Mother 42  . Cancer Brother 15       lung (nonsmoker)  . Breast cancer Paternal Grandmother   . CAD Neg Hx   . Diabetes Neg Hx     Social History Social History   Tobacco Use  . Smoking status: Never Smoker  . Smokeless tobacco: Never Used  Substance Use Topics  . Alcohol use: No    Alcohol/week: 0.0 oz  . Drug use: No    Review of Systems  Constitutional: No fever/chills Eyes: No visual changes. ENT: No sore throat. Cardiovascular: Denies chest pain. Respiratory: Denies shortness of breath. Gastrointestinal: No abdominal pain.  No nausea, no vomiting.  No diarrhea.  No constipation. Genitourinary: Negative for dysuria. Musculoskeletal: Negative for back pain. Skin: Negative for rash. Neurological: Negative for headaches, focal weakness or numbness.   ____________________________________________   PHYSICAL EXAM:  VITAL SIGNS: ED Triage Vitals  Enc Vitals Group     BP 10/29/17 0825 (!) 148/68     Pulse Rate 10/29/17 0822 (!) 109     Resp 10/29/17 0822 18     Temp 10/29/17 0822 98.6 F (37 C)     Temp Source 10/29/17 0822 Oral     SpO2 10/29/17 0822 97 %     Weight 10/29/17 0823 124 lb (56.2 kg)      Height 10/29/17 0823 5' (1.524 m)     Head Circumference --      Peak Flow --      Pain Score 10/29/17 0823 8     Pain Loc --      Pain Edu? --      Excl. in GC? --     Constitutional: Alert and oriented.  Appears uncomfortable. Eyes: Conjunctivae are normal.  Head: Atraumatic. Nose: No congestion/rhinnorhea. Mouth/Throat: Mucous membranes are moist.  Neck: No stridor.   Cardiovascular: Normal rate, regular rhythm. Grossly normal heart sounds.   Respiratory: Normal respiratory effort.  No retractions. Lungs CTAB. Gastrointestinal: Soft with moderate  left lower quadrant abdominal tenderness to palpation without rebound or guarding. No distention.  Left CVA tenderness which is moderate. Musculoskeletal: No lower extremity tenderness nor edema.  No joint effusions. Neurologic:  Normal speech and language. No gross focal neurologic deficits are appreciated. Skin:  Skin is warm, dry and intact. No rash noted. Psychiatric: Mood and affect are normal. Speech and behavior are normal.  ____________________________________________   LABS (all labs ordered are listed, but only abnormal results are displayed)  Labs Reviewed  URINALYSIS, COMPLETE (UACMP) WITH MICROSCOPIC - Abnormal; Notable for the following components:      Result Value   Color, Urine YELLOW (*)    APPearance HAZY (*)    Hgb urine dipstick SMALL (*)    Nitrite POSITIVE (*)    Bacteria, UA MANY (*)    All other components within normal limits  CBC - Abnormal; Notable for the following components:   Hemoglobin 11.9 (*)    All other components within normal limits  BASIC METABOLIC PANEL - Abnormal; Notable for the following components:   Potassium 3.3 (*)    CO2 19 (*)    Glucose, Bld 149 (*)    BUN 27 (*)    All other components within normal limits  URINE CULTURE   ____________________________________________  EKG   ____________________________________________  RADIOLOGY  4mm left uvj  stone ____________________________________________   PROCEDURES  Procedure(s) performed:   Procedures  Critical Care performed:   ____________________________________________   INITIAL IMPRESSION / ASSESSMENT AND PLAN / ED COURSE  Pertinent labs & imaging results that were available during my care of the patient were reviewed by me and considered in my medical decision making (see chart for details).  Differential diagnosis includes, but is not limited to, ovarian cyst, ovarian torsion, acute appendicitis, diverticulitis, urinary tract infection/pyelonephritis, endometriosis, bowel obstruction, colitis, renal colic, gastroenteritis, hernia, fibroids, endometriosis, pregnancy related pain including ectopic pregnancy, etc. As part of my medical decision making, I reviewed the following data within the electronic MEDICAL RECORD NUMBER Office notes reviewed  ----------------------------------------- 12:05 PM on 10/29/2017 -----------------------------------------  Patient without pain relief with Toradol.  Nitrite positive urine with 4 mm left UVJ stone.  Discussed case with Dr. Apolinar Junes of urology who states that the patient will likely need stenting.  Patient had hydrocodone at home Toradol in the emergency department and then morphine and is still in pain.  Likely course explained to the patient and she is understanding and willing to comply. ____________________________________________   FINAL CLINICAL IMPRESSION(S) / ED DIAGNOSES  Kidney stone.  UTI.    NEW MEDICATIONS STARTED DURING THIS VISIT:  New Prescriptions   No medications on file     Note:  This document was prepared using Dragon voice recognition software and may include unintentional dictation errors.     Myrna Blazer, MD 10/29/17 [phone]

## 2017-10-29 NOTE — Anesthesia Preprocedure Evaluation (Signed)
Anesthesia Evaluation  Patient identified by MRN, date of birth, ID band Patient awake    Reviewed: Allergy & Precautions, NPO status , Patient's Chart, lab work & pertinent test results  History of Anesthesia Complications Negative for: history of anesthetic complications  Airway Mallampati: II       Dental   Pulmonary neg sleep apnea, neg COPD,           Cardiovascular hypertension, Pt. on medications (-) Past MI and (-) CHF (-) dysrhythmias (-) Valvular Problems/Murmurs     Neuro/Psych neg Seizures    GI/Hepatic Neg liver ROS, GERD  Medicated and Controlled,  Endo/Other  neg diabetes  Renal/GU negative Renal ROS     Musculoskeletal   Abdominal   Peds  Hematology   Anesthesia Other Findings   Reproductive/Obstetrics                             Anesthesia Physical Anesthesia Plan  ASA: III and emergent  Anesthesia Plan: General   Post-op Pain Management:    Induction: Intravenous, Inhalational and Rapid sequence  PONV Risk Score and Plan: 3 and Ondansetron, Dexamethasone and Midazolam  Airway Management Planned: Oral ETT  Additional Equipment:   Intra-op Plan:   Post-operative Plan:   Informed Consent: I have reviewed the patients History and Physical, chart, labs and discussed the procedure including the risks, benefits and alternatives for the proposed anesthesia with the patient or authorized representative who has indicated his/her understanding and acceptance.     Plan Discussed with:   Anesthesia Plan Comments:         Anesthesia Quick Evaluation

## 2017-10-29 NOTE — Consult Note (Signed)
Name: Brandy Young MRN: [id number] DOB: 05-13-1946    ADMISSION DATE:  10/29/2017 CONSULTATION DATE: 10/29/2017  REFERRING MD : Dr. Caryn Bee   CHIEF COMPLAINT: Hypotension   BRIEF PATIENT DESCRIPTION: 72 yo female admitted to [facility] unit s/p left ureteral stent placement due to urosepsis secondary to obstructing left distal ureteral calculus required transfer to ICU 03/1 with hypotension   SIGNIFICANT EVENTS  03/1-Pt admitted to [facility] unit required transfer to ICU with hypotension   STUDIES:  CT Renal Stone Study 03/1>>mm LEFT UVJ calculus causing moderate LEFT hydroureteronephrosis and perinephric inflammation. Aortic Atherosclerosis (ICD10-I70.0).  HISTORY OF PRESENT ILLNESS:   This is a 72 yo female with a PMH as listed below who presented to [facility] ER 03/1 with c/o severe acute left flank pain with radiation to left lower quadrant onset on 03/1 at 0200 am.  She also endorsed nausea and vomiting.  CT Renal Study revealed obstructing left distal ureteral calculus and UA + for UTI.  Therefore, Urology consulted pt transported to OR for cystoscopy and left ureteral stent placement.  She was subsequently admitted to [facility] unit for further workup and treatment.  On 03/1 she required transfer to ICU due to hypotension.   PAST MEDICAL HISTORY :   has a past medical history of Allergic rhinitis due to pollen, DDD (degenerative disc disease), lumbar, DJD (degenerative joint disease) of knee, Glaucoma, History of chicken pox, History of stomach ulcers (2010s), HLD (hyperlipidemia), HTN (hypertension), IDA (iron deficiency anemia) (2012), Lumbar stenosis with neurogenic claudication (08/2014), Prediabetes, and Tricuspid regurgitation (2012).  has a past surgical history that includes Appendectomy; Vaginal hysterectomy (1980s); Foot surgery (Right); ESI (multiple, latest 03/2015); Colonoscopy (08/2011); Esophagogastroduodenoscopy (08/2011); and Eye surgery. Prior to Admission medications     Medication Sig Start Date End Date Taking? Authorizing Provider  calcium carbonate (OS-CAL) 600 MG TABS tablet Take 600 mg by mouth 2 (two) times daily with a meal.   Yes [provider]  cetirizine (ZYRTEC) 10 MG tablet Take 10 mg by mouth daily.   Yes [provider]  ferrous sulfate 325 (65 FE) MG tablet Take 1 tablet (325 mg total) by mouth daily with breakfast. 01/30/17  Yes Eustaquio Boyden, MD  HYDROcodone-acetaminophen [facility]) 7.5-325 MG per tablet Take 1 tablet by mouth 3 (three) times daily as needed for moderate pain. (Dr Yves Dill) 05/12/15  Yes Eustaquio Boyden, MD  lisinopril (PRINIVIL,ZESTRIL) 10 MG tablet Take 1 tablet (10 mg total) by mouth daily. 08/02/17  Yes Eustaquio Boyden, MD  Magnesium 250 MG TABS Take 1 tablet by mouth 2 (two) times daily.   Yes [provider]  Omega-3 Fatty Acids (FISH OIL) 1200 MG CAPS Take 1 capsule by mouth 2 (two) times daily.   Yes [provider]  omeprazole (PRILOSEC) 40 MG capsule Take 1 capsule (40 mg total) by mouth daily. 08/02/17  Yes Eustaquio Boyden, MD  psyllium (METAMUCIL) 58.6 % powder Take 1 packet by mouth daily.   Yes [provider]  senna-docusate (SENOKOT-S) 8.6-50 MG tablet Take 1 tablet by mouth at bedtime as needed for mild constipation. Patient taking differently: Take 1 tablet by mouth at bedtime.  04/21/16  Yes Eustaquio Boyden, MD  simvastatin (ZOCOR) 10 MG tablet Take 1 tablet (10 mg total) by mouth daily. 10/08/17  Yes Eustaquio Boyden, MD  vitamin B-12 (CYANOCOBALAMIN) 500 MCG tablet Take 1 tablet (500 mcg total) by mouth daily. 01/30/17  Yes Eustaquio Boyden, MD  ibuprofen (ADVIL,MOTRIN) 200 MG tablet Take 200 mg by  mouth every 6 (six) hours as needed.    [provider]   Allergies  Allergen Reactions  . Niacin And Related Hives  . Neurontin [Gabapentin] Other (See Comments), Photosensitivity and Rash    Blurry vision, off balance Blurry vision, off balance     FAMILY HISTORY:  family history includes Breast cancer in her paternal grandmother; Breast cancer (age of onset: 2943) in her mother; Cancer (age of onset: 5844) in her mother; Cancer (age of onset: 2169) in her brother; Hypertension in her father; Stroke (age of onset: 4248) in her father. SOCIAL HISTORY:  reports that  has never smoked. she has never used smokeless tobacco. She reports that she does not drink alcohol or use drugs.  REVIEW OF SYSTEMS: Positives in BOLD    Constitutional: Negative for fever, chills, weight loss, malaise/fatigue and diaphoresis.  HENT: Negative for hearing loss, ear pain, nosebleeds, congestion, sore throat, neck pain, tinnitus and ear discharge.   Eyes: Negative for blurred vision, double vision, photophobia, pain, discharge and redness.  Respiratory: Negative for cough, hemoptysis, sputum production, shortness of breath, wheezing and stridor.   Cardiovascular: Negative for chest pain, palpitations, orthopnea, claudication, leg swelling and PND.  Gastrointestinal: Negative for heartburn, nausea, vomiting, abdominal pain, diarrhea, constipation, blood in stool and melena.  Genitourinary: dysuria, urgency, frequency, hematuria and left flank pain.  Musculoskeletal: Negative for myalgias, back pain, joint pain and falls.  Skin: Negative for itching and rash.  Neurological: Negative for dizziness, tingling, tremors, sensory change, speech change, focal weakness, seizures, loss of consciousness, weakness and headaches.  Endo/Heme/Allergies: Negative for environmental allergies and polydipsia. Does not bruise/bleed easily.  SUBJECTIVE:  Pt c/o abdominal pain   VITAL SIGNS: Temp:  [97.6 F (36.4 C)-101.3 F (38.5 C)] 97.6 F (36.4 C) (03/01 2254) Pulse Rate:  [86-134] 89 (03/01 2326) Resp:  [14-20] 16 (03/01 2254) BP: (67-148)/(39-68) 76/48 (03/01 2326) SpO2:  [84 %-99 %] 98 % (03/01 2326) Weight:  [56.2 kg (124 lb)] 56.2 kg (124 lb) (03/01 0823)  PHYSICAL  EXAMINATION: General: well developed, well nourished, NAD  Neuro: alert and oriented, follows commands  HEENT: supple, no JVD Cardiovascular: nsr, s1s2, no M/R/G  Lungs: clear throughout, even, non labored Abdomen: +BS x4, soft, non tender, non distended  Musculoskeletal: normal bulk and tone, no edema Skin: perineum incision, no rashes   Recent Labs  Lab 10/29/17 0826  NA 140  K 3.3*  CL 110  CO2 19*  BUN 27*  CREATININE 0.78  GLUCOSE 149*   Recent Labs  Lab 10/29/17 0826  HGB 11.9*  HCT 35.1  WBC 5.6  PLT 230   Ct Renal Stone Study  Result Date: 10/29/2017 CLINICAL DATA:  [age] year old female with acute LEFT flank and abdominal pain with nausea. EXAM: CT ABDOMEN AND PELVIS WITHOUT CONTRAST TECHNIQUE: Multidetector CT imaging of the abdomen and pelvis was performed following the standard protocol without IV contrast. COMPARISON:  04/23/2016 abdominal ultrasound and 11/28/2015 lumbar spine MR FINDINGS: Please note that parenchymal abnormalities may be missed without intravenous contrast. Lower chest: No acute abnormality Hepatobiliary: The liver and gallbladder are unremarkable. No biliary dilatation. Pancreas: Unremarkable Spleen: Unremarkable Adrenals/Urinary Tract: A 4 mm LEFT UVJ calculus causes moderate LEFT hydroureteronephrosis and perinephric inflammation. No other urinary calculi are identified. The adrenal glands are unremarkable. Stomach/Bowel: There is no evidence of bowel obstruction, definite bowel wall thickening or bowel inflammatory changes. A small hiatal hernia is noted. Vascular/Lymphatic: Aortic atherosclerosis. No enlarged abdominal or pelvic lymph nodes. Reproductive: Status  post hysterectomy. No adnexal masses. Other: No ascites, focal collection or pneumoperitoneum. Musculoskeletal: No acute bony abnormality or suspicious bony lesion identified. Multilevel degenerative changes throughout the lumbar spine again noted. IMPRESSION: 1. 4 mm LEFT UVJ calculus causing  moderate LEFT hydroureteronephrosis and perinephric inflammation. 2.  Aortic Atherosclerosis (ICD10-I70.0). Electronically Signed   By: Harmon Pier M.D.   On: 10/29/2017 10:01    ASSESSMENT / PLAN: s/p Left ureteral stent placement due to urosepsis secondary to obstructing left distal ureteral calculus Hypotension secondary to sepsis  Moderate left hydronephrosis and hydroureter  Anemia  Acute Pain   P: Supplemental O2 for dyspnea and/or hypoxia  Continuous telemetry monitoring  LR @100  ml/hr  Will fluid resuscitate to maintain map >65 if pt remains hypotensive will start neo-synephrine  Trend WBC and monitor fever curve  Trend PCT and lactic acid  Follow cultures  Continue ceftriaxone Trend BMP  Replace electrolytes as indicated  Monitor UOP  SCD's for VTE prophylaxis  Trend CBC  Monitor for s/sx of bleeding and transfuse for hgb <7 Prn norco and morphine for pain management   Sonda Rumble, AGNP  Pulmonary/Critical Care Pager [phone] (please enter 7 digits) PCCM Consult Pager [phone] (please enter 7 digits)

## 2017-10-29 NOTE — ED Notes (Signed)
First nurse note  Presents with back pain which radiates into abd  Increased pain with breathing

## 2017-10-29 NOTE — ED Notes (Signed)
Nurse updated Brandy Young pt's son.

## 2017-10-29 NOTE — Progress Notes (Signed)
At about 2154, patient's Bp was taken and it was 72/39, 46. The Urologist Dr. Lonna CobbStoioff was called and he ordered a 1000 ml bolus. Pt received bolus. Pt's Bp rechecked at 2254 and the BP was 67/39, 45. The hospitalist  was called and MD ordered another bolus 1000 and would like for patient to be transferred to the ICU. Waiting for patient bed  In the ICU At this time. Pts last Bp was 76/48, 54.

## 2017-10-29 NOTE — ED Notes (Signed)
Pt's son is stepping out at this time. Nurse verified his contact information incase he is needed.

## 2017-10-29 NOTE — ED Notes (Signed)
Pt leaving with orderly for surgical procedure.

## 2017-10-29 NOTE — Progress Notes (Signed)
RR called to room pt was noted to be AAOx4 pt has hypotension and was currently receiving a bolus. physician at bedside requested patient to be transferred to stepdown care for further monitoring.

## 2017-10-29 NOTE — H&P (Signed)
10/29/2017 1:07 PM   Brandy Young 1946/03/09 [id number]  Referring provider: No referring provider defined for this encounter.  Chief Complaint  Patient presents with  . Flank Pain    HPI: Brandy Young is a 72 year old female who presented to the ED this morning after acute onset of left flank pain at 2 AM this morning.  The pain was described as severe without identifiable precipitating, aggravating or alleviating factors.  The pain radiated to the left lower quadrant.  She had associated nausea and vomiting.  She denied fever or chills however urinalysis in the ED was nitrite positive.  A stone protocol CT the abdomen pelvis was remarkable for a 4 mm left distal ureteral calculus with moderate hydronephrosis/hydroureter and perinephric stranding.  Her pain has not been controlled in the ED.  Due to significant pain and UA consistent with infection she presents for urgent placement of a left ureteral stent.  She does have a prior history of stone disease.   PMH: Past Medical History:  Diagnosis Date  . Allergic rhinitis due to pollen   . DDD (degenerative disc disease), lumbar    with spinal stenosis and neurogenic claudication s/p ESI (Chasnis)  . DJD (degenerative joint disease) of knee    failed steroid and synvisc injections  . Glaucoma   . History of chicken pox   . History of stomach ulcers 2010s  . HLD (hyperlipidemia)   . HTN (hypertension)   . IDA (iron deficiency anemia) 2012   s/p normal EGD/colonoscopy (iftikhar)  . 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 Prediabetes   . Tricuspid regurgitation 2012   by echo, EF 73% [place] Flakes)    Surgical History: Past Surgical History:  Procedure Laterality Date  . APPENDECTOMY    . COLONOSCOPY  08/2011   diverticulosis, int hem (Iftikhar)  . ESI  multiple, latest 03/2015   R L5/S1, L L3/4, L L4/5 L5/S1, L L4/5 and  L5/S1 - minimal relief (Chasnis)  . ESOPHAGOGASTRODUODENOSCOPY  08/2011   WNL  . EYE SURGERY     laser eye surgery  . FOOT SURGERY Right   . VAGINAL HYSTERECTOMY  1980s   prolapsed uterus, ovaries remained    Home Medications:  Prior to Admission medications   Medication Sig Start Date End Date Taking? Authorizing Provider  calcium carbonate (OS-CAL) 600 MG TABS tablet Take 600 mg by mouth 2 (two) times daily with a meal.   Yes [provider]  cetirizine (ZYRTEC) 10 MG tablet Take 10 mg by mouth daily.   Yes [provider]  ferrous sulfate 325 (65 FE) MG tablet Take 1 tablet (325 mg total) by mouth daily with breakfast. 01/30/17  Yes Eustaquio Boyden, MD  HYDROcodone-acetaminophen [facility]) 7.5-325 MG per tablet Take 1 tablet by mouth 3 (three) times daily as needed for moderate pain. (Dr Yves Dill) 05/12/15  Yes Eustaquio Boyden, MD  lisinopril (PRINIVIL,ZESTRIL) 10 MG tablet Take 1 tablet (10 mg total) by mouth daily. 08/02/17  Yes Eustaquio Boyden, MD  Magnesium 250 MG TABS Take 1 tablet by mouth 2 (two) times daily.   Yes [provider]  Omega-3 Fatty Acids (FISH OIL) 1200 MG CAPS Take 1 capsule by mouth 2 (two) times daily.   Yes [provider]  omeprazole (PRILOSEC) 40 MG capsule Take 1 capsule (40 mg total) by mouth daily. 08/02/17  Yes Eustaquio Boyden, MD  psyllium (METAMUCIL) 58.6 %  powder Take 1 packet by mouth daily.   Yes [provider]  senna-docusate (SENOKOT-S) 8.6-50 MG tablet Take 1 tablet by mouth at bedtime as needed for mild constipation. Patient taking differently: Take 1 tablet by mouth at bedtime.  04/21/16  Yes Eustaquio BoydenGutierrez, Javier, MD  simvastatin (ZOCOR) 10 MG tablet Take 1 tablet (10 mg total) by mouth daily. 10/08/17  Yes Eustaquio BoydenGutierrez, Javier, MD  vitamin B-12 (CYANOCOBALAMIN) 500 MCG tablet Take 1 tablet (500 mcg total) by mouth daily. 01/30/17  Yes Eustaquio BoydenGutierrez, Javier, MD  ibuprofen (ADVIL,MOTRIN) 200 MG tablet  Take 200 mg by mouth every 6 (six) hours as needed.    [provider]      Allergies:  Allergies  Allergen Reactions  . Niacin And Related Hives  . Neurontin [Gabapentin] Other (See Comments), Photosensitivity and Rash    Blurry vision, off balance Blurry vision, off balance    Family History: Family History  Problem Relation Age of Onset  . Stroke Father 48       hemorrhagic bleed  . Hypertension Father   . Cancer Mother 4144       breast  . Breast cancer Mother 6643  . Cancer Brother 3369       lung (nonsmoker)  . Breast cancer Paternal Grandmother   . CAD Neg Hx   . Diabetes Neg Hx     Social History:  reports that  has never smoked. she has never used smokeless tobacco. She reports that she does not drink alcohol or use drugs.  ROS: Constitutional: No fever/chills Eyes: No visual changes. ENT: No sore throat. Cardiovascular: Denies chest pain. Respiratory: Denies shortness of breath. Gastrointestinal: Positive nausea, vomiting.  No abdominal pain.   No diarrhea.  No constipation. Genitourinary: Negative for dysuria.  Positive flank pain Musculoskeletal: Negative for back pain. Skin: Negative for rash. Neurological: Negative for headaches, focal weakness or numbness.   Physical Exam: BP 133/66   Pulse (!) 105   Temp 98.6 F (37 C) (Oral)   Resp 18   Ht 5' (1.524 m)   Wt 124 lb (56.2 kg)   SpO2 90%   BMI 24.22 kg/m   Constitutional:  Alert and oriented, No acute distress. HEENT: [place] AT, moist mucus membranes.  Trachea midline, no masses. Cardiovascular: No clubbing, cyanosis, or edema. CV RRR Respiratory: Normal respiratory effort, no increased work of breathing.  Lungs clear GI: Abdomen is soft, nontender, nondistended, no abdominal masses GU: No CVA tenderness.  Skin: No rashes, bruises or suspicious lesions. Lymph: No cervical or inguinal adenopathy. Neurologic: Grossly intact, no focal deficits, moving all 4 extremities. Psychiatric:  Normal mood and affect.  Laboratory Data: Lab Results  Component Value Date   WBC 5.6 10/29/2017   HGB 11.9 (L) 10/29/2017   HCT 35.1 10/29/2017   MCV 92.3 10/29/2017   PLT 230 10/29/2017    Lab Results  Component Value Date   CREATININE 0.78 10/29/2017    Lab Results  Component Value Date   HGBA1C 6.1 10/04/2017    Urinalysis Lab Results  Component Value Date   APPEARANCEUR HAZY (A) 10/29/2017   LEUKOCYTESUR NEGATIVE 10/29/2017   PROTEINUR NEGATIVE 10/29/2017   GLUCOSEU NEGATIVE 10/29/2017   RBCU 6-30 10/29/2017   BILIRUBINUR NEGATIVE 10/29/2017   NITRITE POSITIVE (A) 10/29/2017    Lab Results  Component Value Date   BACTERIA MANY (A) 10/29/2017    Pertinent Imaging: CT personally reviewed Results for orders placed during the hospital encounter of 10/29/17  CT RENAL  STONE STUDY   Narrative CLINICAL DATA:  71 year old female with acute LEFT flank and abdominal pain with nausea.  EXAM: CT ABDOMEN AND PELVIS WITHOUT CONTRAST  TECHNIQUE: Multidetector CT imaging of the abdomen and pelvis was performed following the standard protocol without IV contrast.  COMPARISON:  04/23/2016 abdominal ultrasound and 11/28/2015 lumbar spine MR  FINDINGS: Please note that parenchymal abnormalities may be missed without intravenous contrast.  Lower chest: No acute abnormality  Hepatobiliary: The liver and gallbladder are unremarkable. No biliary dilatation.  Pancreas: Unremarkable  Spleen: Unremarkable  Adrenals/Urinary Tract: A 4 mm LEFT UVJ calculus causes moderate LEFT hydroureteronephrosis and perinephric inflammation. No other urinary calculi are identified.  The adrenal glands are unremarkable.  Stomach/Bowel: There is no evidence of bowel obstruction, definite bowel wall thickening or bowel inflammatory changes. A small hiatal hernia is noted.  Vascular/Lymphatic: Aortic atherosclerosis. No enlarged abdominal or pelvic lymph nodes.  Reproductive:  Status post hysterectomy. No adnexal masses.  Other: No ascites, focal collection or pneumoperitoneum.  Musculoskeletal: No acute bony abnormality or suspicious bony lesion identified. Multilevel degenerative changes throughout the lumbar spine again noted.  IMPRESSION: 1. 4 mm LEFT UVJ calculus causing moderate LEFT hydroureteronephrosis and perinephric inflammation. 2.  Aortic Atherosclerosis (ICD10-I70.0).   Electronically Signed   By: Harmon Pier M.D.   On: 10/29/2017 10:01     Assessment & Plan:  72 year old female with an obstructing left distal ureteral calculus with nitrite positive urine and bacteriuria.  Her pain has not been well controlled with parenteral analgesics.  Due to an obstructing stone with infection I have recommended urgent placement of a left ureteral stent.  She received ceftriaxone in the ED.  It was stressed that no attempt will be made at stone removal due to the possibility of worsening sepsis.  She understands her stone will need to be treated at a later date if she does not pass.  The procedure was discussed in detail including potential risks of bleeding, infection/sepsis and ureteral injury.  It is unlikely stent placement will be unsuccessful however the possibility of need for percutaneous nephrostomy was discussed.  She indicated all questions were answered and desires to proceed.     Riki Altes, MD  [facility] [address], [address] [place], [place] [address] [phone]

## 2017-10-29 NOTE — H&P (Signed)
[facility] Medical Consultation  Alaisa Moffitt ZOX:[id number] DOB: 12-25-1945 DOA: 10/29/2017 PCP: Eustaquio Boyden, MD   Requesting physician: Dr Lonna Cobb Date of consultation: 10/29/17 Reason for consultation: Post procedure hypotension  CHIEF COMPLAINT:   Chief Complaint  Patient presents with  . Flank Pain    HISTORY OF PRESENT ILLNESS: Brandy Young  is a 72 y.o. female with a known history of osteoarthritis, hypertension, hyperlipidemia, lumbar stenosis and prediabetes. Patient was admitted through emergency room, just the other day with severe left flank pain.  She was diagnosed with obstructing left distal ureteral calculus with infection.  Patient underwent cystoscopy and left ureteral stent placement by urology, the same day.  Postprocedure, patient has been noted to be hypotensive with systolic blood pressure in 70s, despite IV fluid bolus. Patient currently is lying in the bed in Trendelenburg position.  She denies any chest pain or dizziness, but she admits to feeling very weak.  No fever, no tachycardia.  No nausea/vomiting, no diarrhea, no bleeding. WBC prior to the procedure was within normal limits.  She was started on IV ceftriaxone for UTI.  Urinary culture is pending final result. Internal medicine team is consulted for further management of her hypertension and other medical problems.     PAST MEDICAL HISTORY:   Past Medical History:  Diagnosis Date  . Allergic rhinitis due to pollen   . DDD (degenerative disc disease), lumbar    with spinal stenosis and neurogenic claudication s/p ESI (Chasnis)  . DJD (degenerative joint disease) of knee    failed steroid and synvisc injections  . Glaucoma   . History of chicken pox   . History of stomach ulcers 2010s  . HLD (hyperlipidemia)   . HTN (hypertension)   . IDA (iron deficiency anemia) 2012   s/p normal EGD/colonoscopy (iftikhar)  . 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 Prediabetes   . Tricuspid regurgitation 2012   by echo, EF 73% [place] Flakes)    PAST SURGICAL HISTORY:  Past Surgical History:  Procedure Laterality Date  . APPENDECTOMY    . COLONOSCOPY  08/2011   diverticulosis, int hem (Iftikhar)  . ESI  multiple, latest 03/2015   R L5/S1, L L3/4, L L4/5 L5/S1, L L4/5 and L5/S1 - minimal relief (Chasnis)  . ESOPHAGOGASTRODUODENOSCOPY  08/2011   WNL  . EYE SURGERY     laser eye surgery  . FOOT SURGERY Right   . VAGINAL HYSTERECTOMY  1980s   prolapsed uterus, ovaries remained    SOCIAL HISTORY:  Social History   Tobacco Use  . Smoking status: Never Smoker  . Smokeless tobacco: Never Used  Substance Use Topics  . Alcohol use: No    Alcohol/week: 0.0 oz    FAMILY HISTORY:  Family History  Problem Relation Age of Onset  . Stroke Father 48       hemorrhagic bleed  . Hypertension Father   . Cancer Mother 56       breast  . Breast cancer Mother 59  . Cancer Brother 22       lung (nonsmoker)  . Breast cancer Paternal Grandmother   . CAD Neg Hx   . Diabetes Neg Hx     DRUG ALLERGIES:  Allergies  Allergen Reactions  . Niacin And Related Hives  . Neurontin [Gabapentin] Other (See Comments), Photosensitivity and Rash    Blurry vision, off balance Blurry vision, off balance  REVIEW OF SYSTEMS:   CONSTITUTIONAL: No fever, but patient complains of fatigue and generalized weakness.  EYES: No vision changes.  EARS, NOSE, AND THROAT: No tinnitus or ear pain.  RESPIRATORY: No cough, shortness of breath, wheezing or hemoptysis.  CARDIOVASCULAR: No chest pain, orthopnea, edema.  GASTROINTESTINAL: No nausea, vomiting, diarrhea or abdominal pain.  GENITOURINARY: No dysuria, hematuria.  ENDOCRINE: No polyuria, nocturia,  HEMATOLOGY: No anemia, easy bruising or bleeding SKIN: No rash or lesion. MUSCULOSKELETAL: Positive history of osteoarthritis and lumbar stenosis.    NEUROLOGIC: No focal weakness.  PSYCHIATRY: No anxiety or depression.   MEDICATIONS AT HOME:  Prior to Admission medications   Medication Sig Start Date End Date Taking? Authorizing Provider  calcium carbonate (OS-CAL) 600 MG TABS tablet Take 600 mg by mouth 2 (two) times daily with a meal.   Yes [provider]  cetirizine (ZYRTEC) 10 MG tablet Take 10 mg by mouth daily.   Yes [provider]  ferrous sulfate 325 (65 FE) MG tablet Take 1 tablet (325 mg total) by mouth daily with breakfast. 01/30/17  Yes Eustaquio Boyden, MD  HYDROcodone-acetaminophen [facility]) 7.5-325 MG per tablet Take 1 tablet by mouth 3 (three) times daily as needed for moderate pain. (Dr Yves Dill) 05/12/15  Yes Eustaquio Boyden, MD  lisinopril (PRINIVIL,ZESTRIL) 10 MG tablet Take 1 tablet (10 mg total) by mouth daily. 08/02/17  Yes Eustaquio Boyden, MD  Magnesium 250 MG TABS Take 1 tablet by mouth 2 (two) times daily.   Yes [provider]  Omega-3 Fatty Acids (FISH OIL) 1200 MG CAPS Take 1 capsule by mouth 2 (two) times daily.   Yes [provider]  omeprazole (PRILOSEC) 40 MG capsule Take 1 capsule (40 mg total) by mouth daily. 08/02/17  Yes Eustaquio Boyden, MD  psyllium (METAMUCIL) 58.6 % powder Take 1 packet by mouth daily.   Yes [provider]  senna-docusate (SENOKOT-S) 8.6-50 MG tablet Take 1 tablet by mouth at bedtime as needed for mild constipation. Patient taking differently: Take 1 tablet by mouth at bedtime.  04/21/16  Yes Eustaquio Boyden, MD  simvastatin (ZOCOR) 10 MG tablet Take 1 tablet (10 mg total) by mouth daily. 10/08/17  Yes Eustaquio Boyden, MD  vitamin B-12 (CYANOCOBALAMIN) 500 MCG tablet Take 1 tablet (500 mcg total) by mouth daily. 01/30/17  Yes Eustaquio Boyden, MD  ibuprofen (ADVIL,MOTRIN) 200 MG tablet Take 200 mg by mouth every 6 (six) hours as needed.    [provider]      PHYSICAL EXAMINATION:   VITAL SIGNS: Blood pressure (!) 76/48,  pulse 89, temperature 97.6 F (36.4 C), temperature source Oral, resp. rate 16, height 5' (1.524 m), weight 56.2 kg (124 lb), SpO2 98 %.  GENERAL:  72 y.o.-year-old patient lying in the bed with no acute distress.  EYES: Pupils equal, round, reactive to light and accommodation. No scleral icterus. Extraocular muscles intact.  HEENT: Head atraumatic, normocephalic. Oropharynx and nasopharynx clear.  NECK:  Supple, no jugular venous distention. No thyroid enlargement, no tenderness.  LUNGS: Normal breath sounds bilaterally, no wheezing, rales,rhonchi or crepitation. No use of accessory muscles of respiration.  CARDIOVASCULAR: S1, S2 normal. No no S3/S4.  ABDOMEN: Soft, nontender, nondistended. Bowel sounds present. No organomegaly or mass.  EXTREMITIES: No pedal edema, cyanosis, or clubbing.  NEUROLOGIC: No focal weakness.  Gait not checked, due to hypertension.  PSYCHIATRIC: The patient is alert and oriented x 3.  SKIN: No obvious rash, lesion, or ulcer.   LABORATORY PANEL:  CBC Recent Labs  Lab 10/29/17 0826  WBC 5.6  HGB 11.9*  HCT 35.1  PLT 230  MCV 92.3  MCH 31.4  MCHC 34.0  RDW 13.2   ------------------------------------------------------------------------------------------------------------------  Chemistries  Recent Labs  Lab 10/29/17 0826  NA 140  K 3.3*  CL 110  CO2 19*  GLUCOSE 149*  BUN 27*  CREATININE 0.78  CALCIUM 9.0   ------------------------------------------------------------------------------------------------------------------ estimated creatinine clearance is 48.5 mL/min (by C-G formula based on SCr of 0.78 mg/dL). ------------------------------------------------------------------------------------------------------------------ No results for input(s): TSH, T4TOTAL, T3FREE, THYROIDAB in the last 72 hours.  Invalid input(s): FREET3   Coagulation profile No results for input(s): INR, PROTIME in the last 168  hours. ------------------------------------------------------------------------------------------------------------------- No results for input(s): DDIMER in the last 72 hours. -------------------------------------------------------------------------------------------------------------------  Cardiac Enzymes No results for input(s): CKMB, TROPONINI, MYOGLOBIN in the last 168 hours.  Invalid input(s): CK ------------------------------------------------------------------------------------------------------------------ Invalid input(s): POCBNP  ---------------------------------------------------------------------------------------------------------------  Urinalysis    Component Value Date/Time   COLORURINE YELLOW (A) 10/29/2017 0826   APPEARANCEUR HAZY (A) 10/29/2017 0826   LABSPEC 1.017 10/29/2017 0826   PHURINE 5.0 10/29/2017 0826   GLUCOSEU NEGATIVE 10/29/2017 0826   HGBUR SMALL (A) 10/29/2017 0826   BILIRUBINUR NEGATIVE 10/29/2017 0826   KETONESUR NEGATIVE 10/29/2017 0826   PROTEINUR NEGATIVE 10/29/2017 0826   NITRITE POSITIVE (A) 10/29/2017 0826   LEUKOCYTESUR NEGATIVE 10/29/2017 0826     RADIOLOGY: Ct Renal Stone Study  Result Date: 10/29/2017 CLINICAL DATA:  72 year old female with acute LEFT flank and abdominal pain with nausea. EXAM: CT ABDOMEN AND PELVIS WITHOUT CONTRAST TECHNIQUE: Multidetector CT imaging of the abdomen and pelvis was performed following the standard protocol without IV contrast. COMPARISON:  04/23/2016 abdominal ultrasound and 11/28/2015 lumbar spine MR FINDINGS: Please note that parenchymal abnormalities may be missed without intravenous contrast. Lower chest: No acute abnormality Hepatobiliary: The liver and gallbladder are unremarkable. No biliary dilatation. Pancreas: Unremarkable Spleen: Unremarkable Adrenals/Urinary Tract: A 4 mm LEFT UVJ calculus causes moderate LEFT hydroureteronephrosis and perinephric inflammation. No other urinary calculi are  identified. The adrenal glands are unremarkable. Stomach/Bowel: There is no evidence of bowel obstruction, definite bowel wall thickening or bowel inflammatory changes. A small hiatal hernia is noted. Vascular/Lymphatic: Aortic atherosclerosis. No enlarged abdominal or pelvic lymph nodes. Reproductive: Status post hysterectomy. No adnexal masses. Other: No ascites, focal collection or pneumoperitoneum. Musculoskeletal: No acute bony abnormality or suspicious bony lesion identified. Multilevel degenerative changes throughout the lumbar spine again noted. IMPRESSION: 1. 4 mm LEFT UVJ calculus causing moderate LEFT hydroureteronephrosis and perinephric inflammation. 2.  Aortic Atherosclerosis (ICD10-I70.0). Electronically Signed   By: Harmon Pier M.D.   On: 10/29/2017 10:01    EKG: Orders placed or performed in visit on 04/19/15  . EKG 12-Lead    IMPRESSION AND PLAN:  1.  Hypertension status post left ureteral stent placement, it is likely related to the procedure and pain medication.  However, patient is at high risk for sepsis.  We will continue IV fluids and IV antibiotics.  Check CBC, BMP and EKG. Will transfer patient to stepdown unit for close monitoring. 2. Obstructing left distal ureteral calculus with infection, s/p cystoscopy and left ureteral stent placement, per urology.  Continue IV antibiotic, ceftriaxone and IV fluids.  Monitor clinically closely. 3.  Acute UTI secondary to obstructing left distal ureteral calculus.  Continue IV ceftriaxone while waiting for the final urine culture result. 4.  Hypertension.  We will hold home blood pressure medications, due to patient being hypotensive at this time.  Continue  to monitor blood pressure closely.  5.  Hyperlipidemia, stable, continue statin therapy.       All the records are reviewed and case discussed with Urology and ICU providers. Management plans discussed with the patient, family and they are in agreement.  CODE STATUS:     Code Status Orders  (From admission, onward)        Start     Ordered   10/29/17 1551  Full code  Continuous     10/29/17 1551    Code Status History    Date Active Date Inactive Code Status Order ID Comments User Context   This patient has a current code status but no historical code status.       TOTAL TIME TAKING CARE OF THIS PATIENT: 40 minutes.    Cammy CopaAngela Fahed Morten M.D on 10/29/2017 at 11:28 PM  Between 7am to 6pm - Pager - [phone]  After 6pm go to [email or web address] - password [email or web address]  [facility] Office  [phone]  CC: Primary care physician; Eustaquio BoydenGutierrez, Javier, MD

## 2017-10-29 NOTE — Transfer of Care (Signed)
Immediate Anesthesia Transfer of Care Note  Patient: Carmelina Daneatricia Dupuis Pyon  Procedure(s) Performed: CYSTOSCOPY WITH STENT PLACEMENT (Left Ureter) CYSTOSCOPY WITH RETROGRADE PYELOGRAM (Left Ureter)  Patient Location: PACU  Anesthesia Type:General  Level of Consciousness: awake, alert , oriented and patient cooperative  Airway & Oxygen Therapy: Patient Spontanous Breathing and Patient connected to face mask oxygen  Post-op Assessment: Report given to RN, Post -op Vital signs reviewed and stable and Patient moving all extremities X 4  Post vital signs: Reviewed and stable  Last Vitals:  Vitals:   10/29/17 1110 10/29/17 1444  BP: 133/66 (!) 98/55  Pulse: (!) 105 (!) 113  Resp: 18 19  Temp:  (!) 38.5 C  SpO2: 90% 99%    Last Pain:  Vitals:   10/29/17 1254  TempSrc:   PainSc: 5          Complications: No apparent anesthesia complications

## 2017-10-29 NOTE — ED Notes (Signed)
Pt one assist to the toilet. Pt had BM. Pt stating that she is just feeling weak and off balance.

## 2017-10-29 NOTE — Anesthesia Procedure Notes (Signed)
Procedure Name: Intubation Date/Time: 10/29/2017 1:53 PM Performed by: Sherol DadeMacMang, Kanika Bungert H, CRNA Pre-anesthesia Checklist: Patient identified, Emergency Drugs available, Suction available, Patient being monitored and Timeout performed Patient Re-evaluated:Patient Re-evaluated prior to induction Oxygen Delivery Method: Circle system utilized Preoxygenation: Pre-oxygenation with 100% oxygen Induction Type: IV induction, Rapid sequence and Cricoid Pressure applied Laryngoscope Size: Miller and 2 Grade View: Grade II Tube type: Oral Tube size: 7.0 mm Number of attempts: 1 Airway Equipment and Method: Stylet Placement Confirmation: ETT inserted through vocal cords under direct vision,  positive ETCO2,  CO2 detector and breath sounds checked- equal and bilateral Secured at: 21 cm Tube secured with: Tape Dental Injury: Teeth and Oropharynx as per pre-operative assessment

## 2017-10-30 DIAGNOSIS — Z79899 Other long term (current) drug therapy: Secondary | ICD-10-CM | POA: Diagnosis not present

## 2017-10-30 DIAGNOSIS — R6521 Severe sepsis with septic shock: Secondary | ICD-10-CM | POA: Diagnosis present

## 2017-10-30 DIAGNOSIS — H409 Unspecified glaucoma: Secondary | ICD-10-CM | POA: Diagnosis present

## 2017-10-30 DIAGNOSIS — I071 Rheumatic tricuspid insufficiency: Secondary | ICD-10-CM | POA: Diagnosis present

## 2017-10-30 DIAGNOSIS — K449 Diaphragmatic hernia without obstruction or gangrene: Secondary | ICD-10-CM | POA: Diagnosis present

## 2017-10-30 DIAGNOSIS — R109 Unspecified abdominal pain: Secondary | ICD-10-CM | POA: Diagnosis present

## 2017-10-30 DIAGNOSIS — Z888 Allergy status to other drugs, medicaments and biological substances status: Secondary | ICD-10-CM | POA: Diagnosis not present

## 2017-10-30 DIAGNOSIS — M1710 Unilateral primary osteoarthritis, unspecified knee: Secondary | ICD-10-CM | POA: Diagnosis present

## 2017-10-30 DIAGNOSIS — B9620 Unspecified Escherichia coli [E. coli] as the cause of diseases classified elsewhere: Secondary | ICD-10-CM | POA: Diagnosis present

## 2017-10-30 DIAGNOSIS — I10 Essential (primary) hypertension: Secondary | ICD-10-CM | POA: Diagnosis present

## 2017-10-30 DIAGNOSIS — E872 Acidosis: Secondary | ICD-10-CM | POA: Diagnosis present

## 2017-10-30 DIAGNOSIS — N390 Urinary tract infection, site not specified: Secondary | ICD-10-CM | POA: Diagnosis present

## 2017-10-30 DIAGNOSIS — I9581 Postprocedural hypotension: Secondary | ICD-10-CM | POA: Diagnosis not present

## 2017-10-30 DIAGNOSIS — M48062 Spinal stenosis, lumbar region with neurogenic claudication: Secondary | ICD-10-CM | POA: Diagnosis present

## 2017-10-30 DIAGNOSIS — R6520 Severe sepsis without septic shock: Secondary | ICD-10-CM | POA: Diagnosis not present

## 2017-10-30 DIAGNOSIS — E785 Hyperlipidemia, unspecified: Secondary | ICD-10-CM | POA: Diagnosis present

## 2017-10-30 DIAGNOSIS — A419 Sepsis, unspecified organism: Principal | ICD-10-CM

## 2017-10-30 DIAGNOSIS — N132 Hydronephrosis with renal and ureteral calculous obstruction: Secondary | ICD-10-CM | POA: Diagnosis present

## 2017-10-30 DIAGNOSIS — D649 Anemia, unspecified: Secondary | ICD-10-CM | POA: Diagnosis present

## 2017-10-30 LAB — BASIC METABOLIC PANEL
Anion gap: 9 (ref 5–15)
BUN: 24 mg/dL — AB (ref 6–20)
CALCIUM: 7.2 mg/dL — AB (ref 8.9–10.3)
CO2: 19 mmol/L — ABNORMAL LOW (ref 22–32)
CREATININE: 0.86 mg/dL (ref 0.44–1.00)
Chloride: 113 mmol/L — ABNORMAL HIGH (ref 101–111)
GFR calc Af Amer: >60 mL/min (ref >60)
Glucose, Bld: 121 mg/dL — ABNORMAL HIGH (ref 65–99)
Potassium: 2.9 mmol/L — ABNORMAL LOW (ref 3.5–5.1)
Sodium: 141 mmol/L (ref 135–145)

## 2017-10-30 LAB — TROPONIN I: Troponin I: 0.11 ng/mL (ref <0.03)

## 2017-10-30 LAB — LACTIC ACID, PLASMA
LACTIC ACID, VENOUS: 2.9 mmol/L — AB (ref 0.5–1.9)
Lactic Acid, Venous: 2.3 mmol/L (ref 0.5–1.9)

## 2017-10-30 LAB — CBC WITH DIFFERENTIAL/PLATELET
BASOS PCT: 0 %
Basophils Absolute: 0 10*3/uL (ref 0–0.1)
Eosinophils Absolute: 0 10*3/uL (ref 0–0.7)
Eosinophils Relative: 0 %
HCT: 29.5 % — ABNORMAL LOW (ref 35.0–47.0)
Hemoglobin: 9.8 g/dL — ABNORMAL LOW (ref 12.0–16.0)
LYMPHS PCT: 3 %
Lymphs Abs: 0.5 10*3/uL — ABNORMAL LOW (ref 1.0–3.6)
MCH: 30.8 pg (ref 26.0–34.0)
MCHC: 33 g/dL (ref 32.0–36.0)
MCV: 93.3 fL (ref 80.0–100.0)
MONO ABS: 0.7 10*3/uL (ref 0.2–0.9)
Monocytes Relative: 4 %
NEUTROS ABS: 16.4 10*3/uL — AB (ref 1.4–6.5)
NEUTROS PCT: 93 %
PLATELETS: 154 10*3/uL (ref 150–440)
RBC: 3.17 MIL/uL — ABNORMAL LOW (ref 3.80–5.20)
RDW: 13.3 % (ref 11.5–14.5)
WBC: 17.7 10*3/uL — ABNORMAL HIGH (ref 3.6–11.0)

## 2017-10-30 LAB — HEPATIC FUNCTION PANEL
ALK PHOS: 42 U/L (ref 38–126)
ALT: 23 U/L (ref 14–54)
AST: 49 U/L — ABNORMAL HIGH (ref 15–41)
Albumin: 2.8 g/dL — ABNORMAL LOW (ref 3.5–5.0)
BILIRUBIN TOTAL: 0.7 mg/dL (ref 0.3–1.2)
TOTAL PROTEIN: 5.1 g/dL — AB (ref 6.5–8.1)

## 2017-10-30 LAB — PROCALCITONIN
PROCALCITONIN: 28.79 ng/mL
Procalcitonin: 25.7 ng/mL

## 2017-10-30 LAB — PROTIME-INR
INR: 1.33
Prothrombin Time: 16.4 seconds — ABNORMAL HIGH (ref 11.4–15.2)

## 2017-10-30 LAB — MAGNESIUM: Magnesium: 1.4 mg/dL — ABNORMAL LOW (ref 1.7–2.4)

## 2017-10-30 LAB — MRSA PCR SCREENING: MRSA by PCR: NEGATIVE

## 2017-10-30 LAB — PHOSPHORUS: Phosphorus: 3.7 mg/dL (ref 2.5–4.6)

## 2017-10-30 MED ORDER — POTASSIUM CHLORIDE 10 MEQ/100ML IV SOLN
10.0000 meq | INTRAVENOUS | Status: DC
  Administered 2017-10-30 (×4): 10 meq via INTRAVENOUS
  Filled 2017-10-30 (×4): qty 100

## 2017-10-30 MED ORDER — SODIUM CHLORIDE 0.9 % IV BOLUS (SEPSIS)
500.0000 mL | Freq: Once | INTRAVENOUS | Status: AC
  Administered 2017-10-30: 500 mL via INTRAVENOUS

## 2017-10-30 MED ORDER — POTASSIUM CHLORIDE CRYS ER 20 MEQ PO TBCR
40.0000 meq | EXTENDED_RELEASE_TABLET | Freq: Once | ORAL | Status: AC
  Administered 2017-10-30: 40 meq via ORAL
  Filled 2017-10-30: qty 2

## 2017-10-30 MED ORDER — MAGNESIUM OXIDE 400 (241.3 MG) MG PO TABS
200.0000 mg | ORAL_TABLET | Freq: Two times a day (BID) | ORAL | Status: DC
  Administered 2017-10-30 – 2017-11-01 (×5): 200 mg via ORAL
  Filled 2017-10-30 (×4): qty 1

## 2017-10-30 NOTE — Significant Event (Signed)
Pt admitted to ICU-15 for hypotension from med surg. Report given over the phone. Pt hooked up to monitor, admission BP was 73/44. NS bolus of 500 ordered and administered. LR and phenylephrine gtts ordered and started. Will continue to monitor.

## 2017-10-30 NOTE — Progress Notes (Signed)
Pt has remained alert and oriented with c/o of lower back pain d/t laying in the bed->pt ambulated in room and [facility] with NDN-PRN Norco given x 2.  Pt has remained in NSR-pt remains on Neo-synephrine gtt for hypotension, MAP goal >60, pt is currently on 10mcgs.  Pt transitioned to RA, SpO2 >90%. Lung sounds clear to auscultation.

## 2017-10-30 NOTE — Progress Notes (Signed)
eLink Physician-Brief Progress Note Patient Name: Brandy Young DOB: 03/24/1946 MRN: [id number]     ICD-10-CM   1. Kidney stone N20.0   2. Urinary tract infection without hematuria, site unspecified N39.0     Active Problems:   Ureteral calculus, left  ASked by hospitalist to cam care for this transfer  Currently with nep MAP > 65 Cam care - resiting comfortably  LABS PULMONARY No results for input(s): PHART, PCO2ART, PO2ART, HCO3, TCO2, O2SAT in the last 168 hours.  Invalid input(s): PCO2, PO2  CBC Recent Labs  Lab 10/29/17 0826 10/30/17 0238  HGB 11.9* 9.8*  HCT 35.1 29.5*  WBC 5.6 17.7*  PLT 230 154    COAGULATION No results for input(s): INR in the last 168 hours.  CARDIAC  No results for input(s): TROPONINI in the last 168 hours. No results for input(s): PROBNP in the last 168 hours.   CHEMISTRY Recent Labs  Lab 10/29/17 0826 10/30/17 0238  NA 140 141  K 3.3* 2.9*  CL 110 113*  CO2 19* 19*  GLUCOSE 149* 121*  BUN 27* 24*  CREATININE 0.78 0.86  CALCIUM 9.0 7.2*   Estimated Creatinine Clearance: 46.9 mL/min (by C-G formula based on SCr of 0.86 mg/dL).   LIVER No results for input(s): AST, ALT, ALKPHOS, BILITOT, PROT, ALBUMIN, INR in the last 168 hours.   INFECTIOUS Recent Labs  Lab 10/30/17 0045 10/30/17 0238  LATICACIDVEN 2.9* 2.3*  PROCALCITON 28.79 25.70     ENDOCRINE CBG (last 3)  No results for input(s): GLUCAP in the last 72 hours.       IMAGING x48h  - image(s) personally visualized  -   highlighted in bold Ct Renal Stone Study  Result Date: 10/29/2017 CLINICAL DATA:  72 year old female with acute LEFT flank and abdominal pain with nausea. EXAM: CT ABDOMEN AND PELVIS WITHOUT CONTRAST TECHNIQUE: Multidetector CT imaging of the abdomen and pelvis was performed following the standard protocol without IV contrast. COMPARISON:  04/23/2016 abdominal ultrasound and 11/28/2015 lumbar spine MR FINDINGS: Please note that  parenchymal abnormalities may be missed without intravenous contrast. Lower chest: No acute abnormality Hepatobiliary: The liver and gallbladder are unremarkable. No biliary dilatation. Pancreas: Unremarkable Spleen: Unremarkable Adrenals/Urinary Tract: A 4 mm LEFT UVJ calculus causes moderate LEFT hydroureteronephrosis and perinephric inflammation. No other urinary calculi are identified. The adrenal glands are unremarkable. Stomach/Bowel: There is no evidence of bowel obstruction, definite bowel wall thickening or bowel inflammatory changes. A small hiatal hernia is noted. Vascular/Lymphatic: Aortic atherosclerosis. No enlarged abdominal or pelvic lymph nodes. Reproductive: Status post hysterectomy. No adnexal masses. Other: No ascites, focal collection or pneumoperitoneum. Musculoskeletal: No acute bony abnormality or suspicious bony lesion identified. Multilevel degenerative changes throughout the lumbar spine again noted. IMPRESSION: 1. 4 mm LEFT UVJ calculus causing moderate LEFT hydroureteronephrosis and perinephric inflammation. 2.  Aortic Atherosclerosis (ICD10-I70.0). Electronically Signed   By: Harmon PierJeffrey  Hu M.D.   On: 10/29/2017 10:01   Anti-infectives (From admission, onward)   Start     Dose/Rate Route Frequency Ordered Stop   10/30/17 0800  cefTRIAXone (ROCEPHIN) 1 g in sodium chloride 0.9 % 100 mL IVPB     1 g 200 mL/hr over 30 Minutes Intravenous Every 24 hours 10/29/17 1551     10/29/17 1015  cefTRIAXone (ROCEPHIN) 1 g in sodium chloride 0.9 % 100 mL IVPB     1 g 200 mL/hr over 30 Minutes Intravenous  Once 10/29/17 1006 10/29/17 1108       A) sespsis /  septich shock  Low K  Lactic acidosis improving  p Check LFT, mag, phos, trop  Continue neo  Continue abx  Replete K       Intervention Category Evaluation Type: New Patient Evaluation  Kalman Shan 10/30/2017, 3:49 AM

## 2017-10-30 NOTE — Progress Notes (Signed)
At about 2154, patient's Bp was taken and it was 72/39, 46. The Urologist Dr. Lonna CobbStoioff was called and he ordered a 1000 ml bolus. Pt received bolus. Pt's Bp rechecked at 2254 and the BP was 67/39, 45. The hospitalist  was called and MD ordered another bolus 1000 and would like for patient to be transferred to the ICU. MD also wanted a rapid response to be called. Waiting for patient bed  In the ICU At this time. Pts last Bp was 76/48, 54.

## 2017-10-30 NOTE — Anesthesia Postprocedure Evaluation (Signed)
Anesthesia Post Note  Patient: Brandy Young  Procedure(s) Performed: CYSTOSCOPY WITH STENT PLACEMENT (Left Ureter) CYSTOSCOPY WITH RETROGRADE PYELOGRAM (Left Ureter)  Patient location during evaluation: PACU Anesthesia Type: General Level of consciousness: awake and alert Pain management: pain level controlled Vital Signs Assessment: post-procedure vital signs reviewed and stable Respiratory status: spontaneous breathing and respiratory function stable Cardiovascular status: stable Anesthetic complications: no     Last Vitals:  Vitals:   10/30/17 0645 10/30/17 0700  BP: (!) 104/55 (!) 96/56  Pulse: 86 87  Resp: (!) 21 (!) 21  Temp:    SpO2: 98% 98%    Last Pain:  Vitals:   10/30/17 0400  TempSrc: Oral  PainSc:                  KEPHART,WILLIAM K

## 2017-10-30 NOTE — Progress Notes (Signed)
[employer] - [place] [facility]   PATIENT NAME: Brandy Young    MR#:  [id number]  DATE OF BIRTH:  1945/09/20  SUBJECTIVE:  CHIEF COMPLAINT:   Chief Complaint  Patient presents with  . Flank Pain  No events overnight, feeling better  REVIEW OF SYSTEMS:  CONSTITUTIONAL: No fever, fatigue or weakness.  EYES: No blurred or double vision.  EARS, NOSE, AND THROAT: No tinnitus or ear pain.  RESPIRATORY: No cough, shortness of breath, wheezing or hemoptysis.  CARDIOVASCULAR: No chest pain, orthopnea, edema.  GASTROINTESTINAL: No nausea, vomiting, diarrhea or abdominal pain.  GENITOURINARY: No dysuria, hematuria.  ENDOCRINE: No polyuria, nocturia,  HEMATOLOGY: No anemia, easy bruising or bleeding SKIN: No rash or lesion. MUSCULOSKELETAL: No joint pain or arthritis.   NEUROLOGIC: No tingling, numbness, weakness.  PSYCHIATRY: No anxiety or depression.   ROS  DRUG ALLERGIES:   Allergies  Allergen Reactions  . Niacin And Related Hives  . Neurontin [Gabapentin] Other (See Comments), Photosensitivity and Rash    Blurry vision, off balance Blurry vision, off balance    VITALS:  Blood pressure (!) 80/59, pulse 96, temperature 97.8 F (36.6 C), temperature source Oral, resp. rate (!) 22, height 5' (1.524 m), weight 61.2 kg (134 lb 14.7 oz), SpO2 94 %.  PHYSICAL EXAMINATION:  GENERAL:  72 y.o.-year-old patient lying in the bed with no acute distress.  EYES: Pupils equal, round, reactive to light and accommodation. No scleral icterus. Extraocular muscles intact.  HEENT: Head atraumatic, normocephalic. Oropharynx and nasopharynx clear.  NECK:  Supple, no jugular venous distention. No thyroid enlargement, no tenderness.  LUNGS: Normal breath sounds bilaterally, no wheezing, rales,rhonchi or crepitation. No use of accessory muscles of respiration.  CARDIOVASCULAR: S1, S2 normal. No murmurs, rubs, or gallops.  ABDOMEN: Soft, nontender, nondistended. Bowel sounds  present. No organomegaly or mass.  EXTREMITIES: No pedal edema, cyanosis, or clubbing.  NEUROLOGIC: Cranial nerves II through XII are intact. Muscle strength 5/5 in all extremities. Sensation intact. Gait not checked.  PSYCHIATRIC: The patient is alert and oriented x 3.  SKIN: No obvious rash, lesion, or ulcer.   Physical Exam LABORATORY PANEL:   CBC Recent Labs  Lab 10/30/17 0238  WBC 17.7*  HGB 9.8*  HCT 29.5*  PLT 154   ------------------------------------------------------------------------------------------------------------------  Chemistries  Recent Labs  Lab 10/30/17 0238 10/30/17 0457  NA 141  --   K 2.9*  --   CL 113*  --   CO2 19*  --   GLUCOSE 121*  --   BUN 24*  --   CREATININE 0.86  --   CALCIUM 7.2*  --   MG  --  1.4*  AST  --  49*  ALT  --  23  ALKPHOS  --  42  BILITOT  --  0.7   ------------------------------------------------------------------------------------------------------------------  Cardiac Enzymes Recent Labs  Lab 10/30/17 0457  TROPONINI 0.11*   ------------------------------------------------------------------------------------------------------------------  RADIOLOGY:  Ct Renal Stone Study  Result Date: 10/29/2017 CLINICAL DATA:  72 year old female with acute LEFT flank and abdominal pain with nausea. EXAM: CT ABDOMEN AND PELVIS WITHOUT CONTRAST TECHNIQUE: Multidetector CT imaging of the abdomen and pelvis was performed following the standard protocol without IV contrast. COMPARISON:  04/23/2016 abdominal ultrasound and 11/28/2015 lumbar spine MR FINDINGS: Please note that parenchymal abnormalities may be missed without intravenous contrast. Lower chest: No acute abnormality Hepatobiliary: The liver and gallbladder are unremarkable. No biliary dilatation. Pancreas: Unremarkable Spleen: Unremarkable Adrenals/Urinary Tract: A 4 mm LEFT UVJ calculus causes  moderate LEFT hydroureteronephrosis and perinephric inflammation. No other urinary  calculi are identified. The adrenal glands are unremarkable. Stomach/Bowel: There is no evidence of bowel obstruction, definite bowel wall thickening or bowel inflammatory changes. A small hiatal hernia is noted. Vascular/Lymphatic: Aortic atherosclerosis. No enlarged abdominal or pelvic lymph nodes. Reproductive: Status post hysterectomy. No adnexal masses. Other: No ascites, focal collection or pneumoperitoneum. Musculoskeletal: No acute bony abnormality or suspicious bony lesion identified. Multilevel degenerative changes throughout the lumbar spine again noted. IMPRESSION: 1. 4 mm LEFT UVJ calculus causing moderate LEFT hydroureteronephrosis and perinephric inflammation. 2.  Aortic Atherosclerosis (ICD10-I70.0). Electronically Signed   By: Harmon PierJeffrey  Hu M.D.   On: 10/29/2017 10:01    ASSESSMENT AND PLAN:  1.    Acute septic shock  Secondary to urinary tract infection, left acute ureteral stone  Continue sepsis protocol, empiric antibiotics, IV fluids for rehydration, follow-up on cultures   2.  Acute left ureteral stone with hydronephrosis  Status post left ureteral stent placement with stone extraction Continue empiric antibiotics, IV fluids for rehydration, urology following  3. Acute UTI  secondary to obstructing left distal ureteral calculus Plan of care as stated above  4.  Hypertension Controlled on current regiment   5.  Hyperlipidemia, unspecified  Stable on statin therapy   All the records are reviewed and case discussed with Care Management/Social Workerr. Management plans discussed with the patient, family and they are in agreement.  CODE STATUS: full  TOTAL TIME TAKING CARE OF THIS PATIENT: 35 minutes.     POSSIBLE D/C IN 2-3 DAYS, DEPENDING ON CLINICAL CONDITION.   Evelena AsaMontell D Garrie Woodin M.D on 10/30/2017   Between 7am to 6pm - Pager - [phone]  After 6pm go to [email or web address] - password [email or web address]  [facility] [place] [facility]  Office  [phone]  CC: Primary  care physician; Eustaquio BoydenGutierrez, Javier, MD  Note: This dictation was prepared with Dragon dictation along with smaller phrase technology. Any transcriptional errors that result from this process are unintentional.

## 2017-10-30 NOTE — Plan of Care (Signed)
  Progressing Clinical Measurements: Respiratory complications will improve 10/30/2017 0721 - Progressing by Sandi RavelingFlack, Denman Pichardo, RN Coping: Level of anxiety will decrease 10/30/2017 0721 - Progressing by Sandi RavelingFlack, Makenleigh Crownover, RN Elimination: Will not experience complications related to bowel motility 10/30/2017 0721 - Progressing by Sandi RavelingFlack, Yi Falletta, RN Will not experience complications related to urinary retention 10/30/2017 0721 - Progressing by Sandi RavelingFlack, Akeela Busk, RN Pain Managment: General experience of comfort will improve 10/30/2017 0721 - Progressing by Sandi RavelingFlack, Sol Englert, RN Safety: Ability to remain free from injury will improve 10/30/2017 0721 - Progressing by Sandi RavelingFlack, Kamron Portee, RN Skin Integrity: Risk for impaired skin integrity will decrease 10/30/2017 0721 - Progressing by Sandi RavelingFlack, Hollin Crewe, RN

## 2017-10-30 NOTE — Significant Event (Signed)
Pts lactic acid at 0045 was 2.9. Repeat lab lactic acid at 0238 after 500NS bolus is 2.3. Elink RN notified of critical lab, they will pass message to doctor. No new orders at this time.

## 2017-10-30 NOTE — Progress Notes (Signed)
Urology  Ms. Homero FellersFrank developed hypotension postoperatively requiring transfer to ICU and low-dose phenylephrine for blood pressure result.  Her pain has resolved post stent placement.  Her main complaint is tiredness  Urine culture is growing E. Coli.  Impression: Sepsis secondary to obstructing left ureteral calculus status post stent placement.  Recommendation: As per current management from hospitalist and critical care.  Will schedule definitive stone treatment after discharge.

## 2017-10-30 NOTE — Progress Notes (Signed)
She looks great, in no distress.  She has no complaints except chronic back pain.  She wants to get out of bed.  She remains on low-dose phenylephrine.  Vitals:   10/30/17 0800 10/30/17 0900 10/30/17 1000 10/30/17 1100  BP: (!) 96/55 96/62 (!) 89/66 (!) 80/59  Pulse: 92 90 90 96  Resp: (!) 27 (!) 23 19 (!) 22  Temp: 97.8 F (36.6 C)     TempSrc: Oral     SpO2: (!) 85% 96% 96% 94%  Weight:      Height:         Gen: WDWN in NAD HEENT: NCAT, sclerae white, oropharynx normal Neck: No LAN, no JVD noted Lungs: full BS, no adventitious sounds Cardiovascular: Regular, normal rate, no M noted Abdomen: Soft, NT, +BS Ext: no C/C/E Neuro: PERRL, EOMI, motor/sensory grossly intact Skin: No lesions noted   BMP Latest Ref Rng & Units 10/30/2017 10/29/2017 08/02/2017  Glucose 65 - 99 mg/dL 284(X121(H) 324(M149(H) 79  BUN 6 - 20 mg/dL 01(U24(H) 27(O27(H) 53(G26(H)  Creatinine 0.44 - 1.00 mg/dL 6.440.86 0.340.78 7.420.75  Sodium 135 - 145 mmol/L 141 140 140  Potassium 3.5 - 5.1 mmol/L 2.9(L) 3.3(L) 4.2  Chloride 101 - 111 mmol/L 113(H) 110 107  CO2 22 - 32 mmol/L 19(L) 19(L) 25  Calcium 8.9 - 10.3 mg/dL 7.2(L) 9.0 9.7    CBC Latest Ref Rng & Units 10/30/2017 10/29/2017 08/02/2017  WBC 3.6 - 11.0 K/uL 17.7(H) 5.6 6.8  Hemoglobin 12.0 - 16.0 g/dL 5.9(D9.8(L) 11.9(L) 11.2(L)  Hematocrit 35.0 - 47.0 % 29.5(L) 35.1 33.9(L)  Platelets 150 - 440 K/uL 154 230 308.0    No recent CXR  IMPRESSION: UTI with severe sepsis/septic shock L ureteral stone with hydro  PLAN/REC: Continue current antibiotics Wean phenylephrine to off for MAP goal >60 mmHg Follow-up on culture results and adjust abx accordingly Advance diet and activity   .Billy Fischeravid Brita Jurgensen, MD PCCM service Mobile [phone] Pager [phone] 10/30/2017 4:26 PM

## 2017-10-31 ENCOUNTER — Encounter: Payer: Self-pay | Admitting: Urology

## 2017-10-31 LAB — BASIC METABOLIC PANEL
Anion gap: 8 (ref 5–15)
BUN: 19 mg/dL (ref 6–20)
CALCIUM: 8.2 mg/dL — AB (ref 8.9–10.3)
CO2: 19 mmol/L — ABNORMAL LOW (ref 22–32)
CREATININE: 0.62 mg/dL (ref 0.44–1.00)
Chloride: 116 mmol/L — ABNORMAL HIGH (ref 101–111)
GFR calc Af Amer: >60 mL/min (ref >60)
GFR calc non Af Amer: >60 mL/min (ref >60)
GLUCOSE: 90 mg/dL (ref 65–99)
Potassium: 4.1 mmol/L (ref 3.5–5.1)
Sodium: 143 mmol/L (ref 135–145)

## 2017-10-31 LAB — CBC
HCT: 29.8 % — ABNORMAL LOW (ref 35.0–47.0)
Hemoglobin: 9.8 g/dL — ABNORMAL LOW (ref 12.0–16.0)
MCH: 30.4 pg (ref 26.0–34.0)
MCHC: 33 g/dL (ref 32.0–36.0)
MCV: 92.1 fL (ref 80.0–100.0)
Platelets: 147 10*3/uL — ABNORMAL LOW (ref 150–440)
RBC: 3.24 MIL/uL — ABNORMAL LOW (ref 3.80–5.20)
RDW: 13.9 % (ref 11.5–14.5)
WBC: 26.1 10*3/uL — ABNORMAL HIGH (ref 3.6–11.0)

## 2017-10-31 LAB — URINE CULTURE

## 2017-10-31 LAB — PROCALCITONIN: Procalcitonin: 16.43 ng/mL

## 2017-10-31 MED ORDER — CEFAZOLIN SODIUM-DEXTROSE 2-4 GM/100ML-% IV SOLN
2.0000 g | Freq: Three times a day (TID) | INTRAVENOUS | Status: DC
  Administered 2017-10-31 – 2017-11-01 (×3): 2 g via INTRAVENOUS
  Filled 2017-10-31 (×6): qty 100

## 2017-10-31 NOTE — Plan of Care (Signed)
Patient remains on Neo infusion at this time.  MAP >65 during shift.  No acute distress noted.  Able to make needs known.  PRN pain medication given x 1 this shift.  Will continue to monitor. Good urine output.

## 2017-10-31 NOTE — Progress Notes (Signed)
Ambulated in hall all around Nurse's station, tol well.

## 2017-10-31 NOTE — Progress Notes (Signed)
Came from CCU, VSS. Up ad lib in room. No complaints.

## 2017-10-31 NOTE — Progress Notes (Signed)
[employer] - [place] [facility]   PATIENT NAME: Brandy Leveringatricia Young    MR#:  [id number]  DATE OF BIRTH:  [birth year]  SUBJECTIVE:  CHIEF COMPLAINT:   Chief Complaint  Patient presents with  . Flank Pain  Much improved, discussed with intensivist, transfer to the floor later today  REVIEW OF SYSTEMS:  CONSTITUTIONAL: No fever, fatigue or weakness.  EYES: No blurred or double vision.  EARS, NOSE, AND THROAT: No tinnitus or ear pain.  RESPIRATORY: No cough, shortness of breath, wheezing or hemoptysis.  CARDIOVASCULAR: No chest pain, orthopnea, edema.  GASTROINTESTINAL: No nausea, vomiting, diarrhea or abdominal pain.  GENITOURINARY: No dysuria, hematuria.  ENDOCRINE: No polyuria, nocturia,  HEMATOLOGY: No anemia, easy bruising or bleeding SKIN: No rash or lesion. MUSCULOSKELETAL: No joint pain or arthritis.   NEUROLOGIC: No tingling, numbness, weakness.  PSYCHIATRY: No anxiety or depression.   ROS  DRUG ALLERGIES:   Allergies  Allergen Reactions  . Niacin And Related Hives  . Neurontin [Gabapentin] Other (See Comments), Photosensitivity and Rash    Blurry vision, off balance Blurry vision, off balance    VITALS:  Blood pressure 110/76, pulse (!) 102, temperature 98.7 F (37.1 C), temperature source Oral, resp. rate (!) 26, height 5' (1.524 m), weight 61.2 kg (134 lb 14.7 oz), SpO2 93 %.  PHYSICAL EXAMINATION:  GENERAL:  72 y.o.-year-old patient lying in the bed with no acute distress.  EYES: Pupils equal, round, reactive to light and accommodation. No scleral icterus. Extraocular muscles intact.  HEENT: Head atraumatic, normocephalic. Oropharynx and nasopharynx clear.  NECK:  Supple, no jugular venous distention. No thyroid enlargement, no tenderness.  LUNGS: Normal breath sounds bilaterally, no wheezing, rales,rhonchi or crepitation. No use of accessory muscles of respiration.  CARDIOVASCULAR: S1, S2 normal. No murmurs, rubs, or gallops.  ABDOMEN:  Soft, nontender, nondistended. Bowel sounds present. No organomegaly or mass.  EXTREMITIES: No pedal edema, cyanosis, or clubbing.  NEUROLOGIC: Cranial nerves II through XII are intact. Muscle strength 5/5 in all extremities. Sensation intact. Gait not checked.  PSYCHIATRIC: The patient is alert and oriented x 3.  SKIN: No obvious rash, lesion, or ulcer.   Physical Exam LABORATORY PANEL:   CBC Recent Labs  Lab 10/31/17 0402  WBC 26.1*  HGB 9.8*  HCT 29.8*  PLT 147*   ------------------------------------------------------------------------------------------------------------------  Chemistries  Recent Labs  Lab 10/30/17 0457 10/31/17 0402  NA  --  143  K  --  4.1  CL  --  116*  CO2  --  19*  GLUCOSE  --  90  BUN  --  19  CREATININE  --  0.62  CALCIUM  --  8.2*  MG 1.4*  --   AST 49*  --   ALT 23  --   ALKPHOS 42  --   BILITOT 0.7  --    ------------------------------------------------------------------------------------------------------------------  Cardiac Enzymes Recent Labs  Lab 10/30/17 0457  TROPONINI 0.11*   ------------------------------------------------------------------------------------------------------------------  RADIOLOGY:  No results found.  ASSESSMENT AND PLAN:  1.  Acute septic shock  Resolved Secondary to E. coli urinary tract infection, left acute ureteral stone  Treated on our sepsis protocol, IV Rocephin with IV fluids for rehydration  2.  Acute left ureteral stone with hydronephrosis  Resolved S/p left ureteral stent placement with stone extraction Urology input appreciated  3.Acute E. coli UTI  secondary to obstructing left distal ureteral calculus Plan of care as stated above  4.Hypertension Controlled on current regiment   5.Hyperlipidemia, unspecified  Stable on statin therapy    All the records are reviewed and case discussed with Care Management/Social Workerr. Management plans discussed with the patient,  family and they are in agreement.  CODE STATUS: full  TOTAL TIME TAKING CARE OF THIS PATIENT: 35 minutes.     POSSIBLE D/C IN 1-3 DAYS, DEPENDING ON CLINICAL CONDITION.   Evelena Asa Jamaia Brum M.D on 10/31/2017   Between 7am to 6pm - Pager - [phone]  After 6pm go to [email or web address] - password [email or web address]  [facility] [place] [facility]  Office  [phone]  CC: Primary care physician; Eustaquio Boyden, MD  Note: This dictation was prepared with Dragon dictation along with smaller phrase technology. Any transcriptional errors that result from this process are unintentional.

## 2017-10-31 NOTE — Progress Notes (Signed)
Urology  Admitted 10/29/2017 with an obstructing left distal ureteral calculus and infection.  Status post urgent stent placement and postoperatively developed hypotension/sepsis requiring ICU transfer and low-dose blood pressure support.  She was transferred to the floor earlier this morning.  Complains of mild low back pain  VSS, afebrile  Voided urine growing E. coli pansensitive except for an intermediate sensitivity to nitrofurantoin.  Intraoperative culture from the left renal pelvis growing 50,000 gram-negative rods.  Impression: Obstructing left distal ureteral calculus with sepsis status post stent placement.  Plan: Will discharge based on recommendation of hospitalist.

## 2017-10-31 NOTE — Progress Notes (Signed)
Pt has remained alert and oriented with minimal c/o lower back pain this am 3/10-[facility] to recliner for breakfast.  Pt has remained in NSR, BP WNL.  Pt remains on RA, lung sounds clear to auscultation, SpO2 >90%. Pt has orders to tx to med-surg, no tele. Report given to Darl PikesSusan, RN.

## 2017-11-01 ENCOUNTER — Other Ambulatory Visit: Payer: Self-pay | Admitting: Urology

## 2017-11-01 ENCOUNTER — Other Ambulatory Visit: Payer: Self-pay

## 2017-11-01 ENCOUNTER — Telehealth: Payer: Self-pay | Admitting: Urology

## 2017-11-01 LAB — URINE CULTURE: Culture: 50000 — AB

## 2017-11-01 LAB — GLUCOSE, CAPILLARY: GLUCOSE-CAPILLARY: 95 mg/dL (ref 65–99)

## 2017-11-01 MED ORDER — OXYBUTYNIN CHLORIDE 5 MG PO TABS: 5.0000 mg | ORAL_TABLET | Freq: Three times a day (TID) | ORAL | 0 refills | Status: DC

## 2017-11-01 MED ORDER — CEPHALEXIN 500 MG PO CAPS: 500.0000 mg | ORAL_CAPSULE | Freq: Three times a day (TID) | ORAL | 0 refills | Status: DC

## 2017-11-01 MED ORDER — TAMSULOSIN HCL 0.4 MG PO CAPS: 0.4000 mg | ORAL_CAPSULE | Freq: Every day | ORAL | 3 refills | Status: DC

## 2017-11-01 NOTE — Progress Notes (Signed)
Urology Admitted 10/29/2017 with an obstructing left distal ureteral calculus and infection.  Status post urgent stent placement and postoperatively developed hypotension/sepsis requiring ICU transfer and low-dose blood pressure support.  Complains of MSK pain.  No stent pain.    VSS, afebrile  Voided urine growing E. coli pansensitive except for an intermediate sensitivity to nitrofurantoin.  Intraoperative culture from the left renal pelvis growing 50,000 gram-negative rods.  Impression: Obstructing left distal ureteral calculus with sepsis status post stent placement.  Plan: Will discharge based on recommendation of hospitalist.  Okay from urological standpoint for discharge.

## 2017-11-01 NOTE — Progress Notes (Signed)
Pt cleared for discharge. Pt given AVS and discharge instructions provided for pt and son. All questions answered. Prescriptions given. IV's removed. Volunteers called to transport to pt to exit.

## 2017-11-01 NOTE — Progress Notes (Signed)
Scripts for tamsulosin and oxybutynin sent to pharmacy to help with stent symptoms.

## 2017-11-01 NOTE — Care Management Important Message (Signed)
Important Message  Patient Details  Name: Brandy Young MRN: [id number] Date of Birth: [birth year]   Medicare Important Message Given:  N/A - LOS <3 / Initial given by admissions    Chapman FitchBOWEN, Madylyn Insco T, RN 11/01/2017, 11:16 AM

## 2017-11-01 NOTE — Discharge Summary (Signed)
[facility] [place] [facility]   PATIENT NAME: Brandy Young    MR#:  [id number]  DATE OF BIRTH:  1946-07-30  DATE OF ADMISSION:  10/29/2017 ADMITTING PHYSICIAN: Riki Altes, MD  DATE OF DISCHARGE: No discharge date for patient encounter.  PRIMARY CARE PHYSICIAN: Eustaquio Boyden, MD    ADMISSION DIAGNOSIS:  Kidney stone [N20.0] Urinary tract infection without hematuria, site unspecified [N39.0]  DISCHARGE DIAGNOSIS:  Active Problems:   Ureteral calculus, left   Flank pain   SECONDARY DIAGNOSIS:   Past Medical History:  Diagnosis Date  . Allergic rhinitis due to pollen   . DDD (degenerative disc disease), lumbar    with spinal stenosis and neurogenic claudication s/p ESI (Chasnis)  . DJD (degenerative joint disease) of knee    failed steroid and synvisc injections  . Glaucoma   . History of chicken pox   . History of stomach ulcers 2010s  . HLD (hyperlipidemia)   . HTN (hypertension)   . IDA (iron deficiency anemia) 2012   s/p normal EGD/colonoscopy (iftikhar)  . 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 Prediabetes   . Tricuspid regurgitation 2012   by echo, EF 73% Welton Flakes)    HOSPITAL COURSE:  1.Acute septic shock  Resolved Secondary to E. coli urinary tract infection, left acute ureteral stone  Treated on our sepsis protocol,  did require short stay in the ICU, treated empirically with IV Rocephin/Ancef, to complete course with Keflex, provided IV fluids for rehydration   2.Acute left ureteral stone with hydronephrosis  Resolved S/p left ureteral stent placement with stone extraction Urology did see patient while in house-to follow-up status post discharge for continued care  3.Acute E. coli UTI  Resolving secondary to obstructing left distal ureteral calculus Plan of care as stated  above  4.Hypertension Controlled on current regiment  5.Hyperlipidemia,unspecified  Stable on statin therapy   DISCHARGE CONDITIONS:  On the day of discharge patient is afebrile, he dynamically stable, tolerating diet, ready for discharge home, for more specific details please see chart  CONSULTS OBTAINED:    DRUG ALLERGIES:   Allergies  Allergen Reactions  . Niacin And Related Hives  . Neurontin [Gabapentin] Other (See Comments), Photosensitivity and Rash    Blurry vision, off balance Blurry vision, off balance    DISCHARGE MEDICATIONS:   Allergies as of 11/01/2017      Reactions   Niacin And Related Hives   Neurontin [gabapentin] Other (See Comments), Photosensitivity, Rash   Blurry vision, off balance Blurry vision, off balance      Medication List    TAKE these medications   calcium carbonate 600 MG Tabs tablet Commonly known as:  OS-CAL Take 600 mg by mouth 2 (two) times daily with a meal.   cephALEXin 500 MG capsule Commonly known as:  KEFLEX Take 1 capsule (500 mg total) by mouth 3 (three) times daily for 10 days.   cetirizine 10 MG tablet Commonly known as:  ZYRTEC Take 10 mg by mouth daily.   ferrous sulfate 325 (65 FE) MG tablet Take 1 tablet (325 mg total) by mouth daily with breakfast.   Fish Oil 1200 MG Caps Take 1 capsule by mouth 2 (two) times daily.   HYDROcodone-acetaminophen 7.5-325 MG tablet Commonly known as:  NORCO Take 1 tablet by mouth 3 (three) times daily as needed for moderate pain. (Dr Yves Dill)   ibuprofen 200  MG tablet Commonly known as:  ADVIL,MOTRIN Take 200 mg by mouth every 6 (six) hours as needed.   lisinopril 10 MG tablet Commonly known as:  PRINIVIL,ZESTRIL Take 1 tablet (10 mg total) by mouth daily.   Magnesium 250 MG Tabs Take 1 tablet by mouth 2 (two) times daily.   omeprazole 40 MG capsule Commonly known as:  PRILOSEC Take 1 capsule (40 mg total) by mouth daily.   psyllium 58.6 % powder Commonly  known as:  METAMUCIL Take 1 packet by mouth daily.   senna-docusate 8.6-50 MG tablet Commonly known as:  Senokot-S Take 1 tablet by mouth at bedtime as needed for mild constipation. What changed:  when to take this   simvastatin 10 MG tablet Commonly known as:  ZOCOR Take 1 tablet (10 mg total) by mouth daily.   vitamin B-12 500 MCG tablet Commonly known as:  CYANOCOBALAMIN Take 1 tablet (500 mcg total) by mouth daily.        DISCHARGE INSTRUCTIONS:   If you experience worsening of your admission symptoms, develop shortness of breath, life threatening emergency, suicidal or homicidal thoughts you must seek medical attention immediately by calling 911 or calling your MD immediately  if symptoms less severe.  You Must read complete instructions/literature along with all the possible adverse reactions/side effects for all the Medicines you take and that have been prescribed to you. Take any new Medicines after you have completely understood and accept all the possible adverse reactions/side effects.   Please note  You were cared for by a hospitalist during your hospital stay. If you have any questions about your discharge medications or the care you received while you were in the hospital after you are discharged, you can call the unit and asked to speak with the hospitalist on call if the hospitalist that took care of you is not available. Once you are discharged, your primary care physician will handle any further medical issues. Please note that NO REFILLS for any discharge medications will be authorized once you are discharged, as it is imperative that you return to your primary care physician (or establish a relationship with a primary care physician if you do not have one) for your aftercare needs so that they can reassess your need for medications and monitor your lab values.    Today   CHIEF COMPLAINT:   Chief Complaint  Patient presents with  . Flank Pain    HISTORY OF  PRESENT ILLNESS:  72 y.o. female with a known history of osteoarthritis, hypertension, hyperlipidemia, lumbar stenosis and prediabetes. Patient was admitted through emergency room, just the other day with severe left flank pain.  She was diagnosed with obstructing left distal ureteral calculus with infection.  Patient underwent cystoscopy and left ureteral stent placement by urology, the same day.  Postprocedure, patient has been noted to be hypotensive with systolic blood pressure in 70s, despite IV fluid bolus. Patient currently is lying in the bed in Trendelenburg position.  She denies any chest pain or dizziness, but she admits to feeling very weak.  No fever, no tachycardia.  No nausea/vomiting, no diarrhea, no bleeding. WBC prior to the procedure was within normal limits.  She was started on IV ceftriaxone for UTI.  Urinary culture is pending final result. Internal medicine team is consulted for further management of her hypertension and other medical problems.    VITAL SIGNS:  Blood pressure 134/65, pulse 88, temperature 98.6 F (37 C), temperature source Oral, resp. rate 18, height 5' (  1.524 m), weight 61.2 kg (134 lb 14.7 oz), SpO2 95 %.  I/O:    Intake/Output Summary (Last 24 hours) at 11/01/2017 1020 Last data filed at 10/31/2017 1901 Gross per 24 hour  Intake 860 ml  Output -  Net 860 ml    PHYSICAL EXAMINATION:  GENERAL:  72 y.o.-year-old patient lying in the bed with no acute distress.  EYES: Pupils equal, round, reactive to light and accommodation. No scleral icterus. Extraocular muscles intact.  HEENT: Head atraumatic, normocephalic. Oropharynx and nasopharynx clear.  NECK:  Supple, no jugular venous distention. No thyroid enlargement, no tenderness.  LUNGS: Normal breath sounds bilaterally, no wheezing, rales,rhonchi or crepitation. No use of accessory muscles of respiration.  CARDIOVASCULAR: S1, S2 normal. No murmurs, rubs, or gallops.  ABDOMEN: Soft, non-tender,  non-distended. Bowel sounds present. No organomegaly or mass.  EXTREMITIES: No pedal edema, cyanosis, or clubbing.  NEUROLOGIC: Cranial nerves II through XII are intact. Muscle strength 5/5 in all extremities. Sensation intact. Gait not checked.  PSYCHIATRIC: The patient is alert and oriented x 3.  SKIN: No obvious rash, lesion, or ulcer.   DATA REVIEW:   CBC Recent Labs  Lab 10/31/17 0402  WBC 26.1*  HGB 9.8*  HCT 29.8*  PLT 147*    Chemistries  Recent Labs  Lab 10/30/17 0457 10/31/17 0402  NA  --  143  K  --  4.1  CL  --  116*  CO2  --  19*  GLUCOSE  --  90  BUN  --  19  CREATININE  --  0.62  CALCIUM  --  8.2*  MG 1.4*  --   AST 49*  --   ALT 23  --   ALKPHOS 42  --   BILITOT 0.7  --     Cardiac Enzymes Recent Labs  Lab 10/30/17 0457  TROPONINI 0.11*    Microbiology Results  Results for orders placed or performed during the hospital encounter of 10/29/17  Urine Culture     Status: Abnormal   Collection Time: 10/29/17  8:26 AM  Result Value Ref Range Status   Specimen Description   Final    URINE, RANDOM Performed at [facility], [address] [place], [place] [address]    Special Requests   Final    NONE Performed at [facility], [address] [place], [place] [address]    Culture >=100,000 COLONIES/mL ESCHERICHIA COLI (A)  Final   Report Status 10/31/2017 FINAL  Final   Organism ID, Bacteria ESCHERICHIA COLI (A)  Final      Susceptibility   Escherichia coli - MIC*    AMPICILLIN <=2 SENSITIVE Sensitive     CEFAZOLIN <=4 SENSITIVE Sensitive     CEFTRIAXONE <=1 SENSITIVE Sensitive     CIPROFLOXACIN <=0.25 SENSITIVE Sensitive     GENTAMICIN <=1 SENSITIVE Sensitive     IMIPENEM <=0.25 SENSITIVE Sensitive     NITROFURANTOIN 64 INTERMEDIATE Intermediate     TRIMETH/SULFA <=20 SENSITIVE Sensitive     AMPICILLIN/SULBACTAM <=2 SENSITIVE Sensitive     PIP/TAZO <=4 SENSITIVE Sensitive     Extended ESBL NEGATIVE Sensitive     *  >=100,000 COLONIES/mL ESCHERICHIA COLI  Urine Culture     Status: Abnormal   Collection Time: 10/29/17  2:17 PM  Result Value Ref Range Status   Specimen Description   Final    CYSTOSCOPY Performed at [facility], [address] [place], [place] [address]    Special Requests  Final    URINE, RANDOM Performed at [facility], [address] [place], [place] [address]    Culture 50,000 COLONIES/mL ESCHERICHIA COLI (A)  Final   Report Status 11/01/2017 FINAL  Final   Organism ID, Bacteria ESCHERICHIA COLI (A)  Final      Susceptibility   Escherichia coli - MIC*    AMPICILLIN 4 SENSITIVE Sensitive     CEFAZOLIN <=4 SENSITIVE Sensitive     CEFEPIME <=1 SENSITIVE Sensitive     CEFTAZIDIME <=1 SENSITIVE Sensitive     CEFTRIAXONE <=1 SENSITIVE Sensitive     CIPROFLOXACIN <=0.25 SENSITIVE Sensitive     GENTAMICIN <=1 SENSITIVE Sensitive     IMIPENEM <=0.25 SENSITIVE Sensitive     TRIMETH/SULFA <=20 SENSITIVE Sensitive     AMPICILLIN/SULBACTAM <=2 SENSITIVE Sensitive     PIP/TAZO <=4 SENSITIVE Sensitive     Extended ESBL NEGATIVE Sensitive     * 50,000 COLONIES/mL ESCHERICHIA COLI  CULTURE, BLOOD (ROUTINE X 2) w Reflex to ID Panel     Status: None (Preliminary result)   Collection Time: 10/30/17 12:55 AM  Result Value Ref Range Status   Specimen Description BLOOD BLOOD LEFT WRIST  Final   Special Requests   Final    BOTTLES DRAWN AEROBIC AND ANAEROBIC Blood Culture adequate volume   Culture   Final    NO GROWTH 2 DAYS Performed at [facility], [address], [place], [place] [address]    Report Status PENDING  Incomplete  CULTURE, BLOOD (ROUTINE X 2) w Reflex to ID Panel     Status: None (Preliminary result)   Collection Time: 10/30/17 12:56 AM  Result Value Ref Range Status   Specimen Description BLOOD RIGHT ANTECUBITAL  Final   Special Requests   Final    BOTTLES DRAWN AEROBIC AND ANAEROBIC Blood Culture adequate volume   Culture   Final     NO GROWTH 2 DAYS Performed at [facility], [address], [place], [place] [address]    Report Status PENDING  Incomplete  MRSA PCR Screening     Status: None   Collection Time: 10/30/17  1:03 AM  Result Value Ref Range Status   MRSA by PCR NEGATIVE NEGATIVE Final    Comment:        The GeneXpert MRSA Assay (FDA approved for NASAL specimens only), is one component of a comprehensive MRSA colonization surveillance program. It is not intended to diagnose MRSA infection nor to guide or monitor treatment for MRSA infections. Performed at [facility], [address] [place], [place] [address]     RADIOLOGY:  No results found.  EKG:   Orders placed or performed in visit on 04/19/15  . EKG 12-Lead      Management plans discussed with the patient, family and they are in agreement.  CODE STATUS:     Code Status Orders  (From admission, onward)        Start     Ordered   10/29/17 1551  Full code  Continuous     10/29/17 1551    Code Status History    Date Active Date Inactive Code Status Order ID Comments User Context   This patient has a current code status but no historical code status.      TOTAL TIME TAKING CARE OF THIS PATIENT: 45 minutes.    Evelena Asa Rajinder Mesick M.D on 11/01/2017 at 10:20 AM  Between 7am to 6pm - Pager - [phone]  After 6pm go  to [email or web address] - password [email or web address]  [facility]  Office  [phone]  CC: Primary care physician; Eustaquio Boyden, MD   Note: This dictation was prepared with Dragon dictation along with smaller phrase technology. Any transcriptional errors that result from this process are unintentional.

## 2017-11-01 NOTE — Telephone Encounter (Signed)
App has been made ° ° °Michelle °

## 2017-11-01 NOTE — Telephone Encounter (Signed)
Please make sure patient has a follow up appointment for her stone.

## 2017-11-01 NOTE — Evaluation (Signed)
Physical Therapy Evaluation Patient Details Name: Brandy Young MRN: [id number] DOB: 1946-03-03 Today's Date: 11/01/2017   History of Present Illness  Pt is a 72 y/o F who presented with obstructing L distal ureteral calculus with infection s/p cystoscopy and L ureteral stent placement.  Postprocedure pt was hypotensive.  Pt admitted with acute septic shock secondary to E-coli UTI.  Pt's PMH includes lumbar spinal stenosis with neurogenic claudication, R foot surgery.    Clinical Impression  Pt admitted with above diagnosis. Pt currently with functional limitations due to the deficits listed below (see PT Problem List). Brandy Young presents independent with transfers but this PT did provide min guard to ambulate 160 ft as pt demonstrated unsteadiness with higher level balance activities.  Pt scored a 38/56 on the [employer], indicating pt is at an increased risk of falling.  Pt educated on results of test and implications as well as recommendation for OPPT.  Pt will benefit from skilled PT to increase their independence and safety with mobility to allow discharge to the venue listed below.      Follow Up Recommendations Outpatient PT    Equipment Recommendations  None recommended by PT    Recommendations for Other Services       Precautions / Restrictions Precautions Precautions: Fall Restrictions Weight Bearing Restrictions: No      Mobility  Bed Mobility               General bed mobility comments: Pt standing in room upon arrival, sitting EOB at end of session  Transfers Overall transfer level: Independent Equipment used: None             General transfer comment: No cues or physical assist required, pt performs independently.    Ambulation/Gait Ambulation/Gait assistance: Min guard Ambulation Distance (Feet): 160 Feet Assistive device: None Gait Pattern/deviations: Decreased stride length;Trunk flexed;Decreased stance time - left Gait velocity:  decreased Gait velocity interpretation: Below normal speed for age/gender General Gait Details: Pt with decreased gait speed and guarded posture and mild stumbling with higher level balance activities.    Stairs            Wheelchair Mobility    Modified Rankin (Stroke Patients Only)       Balance Overall balance assessment: Needs assistance Sitting-balance support: No upper extremity supported;Feet supported Sitting balance-Leahy Scale: Good       Standing balance-Leahy Scale: Fair Standing balance comment: Pt able to maintain balance but would likely lose her balance with perturbation                 Standardized Balance Assessment Standardized Balance Assessment : Berg Balance Test;Dynamic Gait Index Berg Balance Test Sit to Stand: Able to stand  independently using hands Standing Unsupported: Able to stand safely 2 minutes Sitting with Back Unsupported but Feet Supported on Floor or Stool: Able to sit safely and securely 2 minutes Stand to Sit: Sits safely with minimal use of hands Transfers: Able to transfer safely, minor use of hands Standing Unsupported with Eyes Closed: Able to stand 10 seconds safely Standing Ubsupported with Feet Together: Able to place feet together independently and stand 1 minute safely From Standing, Reach Forward with Outstretched Arm: Can reach forward >12 cm safely (5") From Standing Position, Pick up Object from Floor: Unable to try/needs assist to keep balance(must hold onto countertop to perform) From Standing Position, Turn to Look Behind Over each Shoulder: Looks behind one side only/other side shows less weight shift Turn  360 Degrees: Able to turn 360 degrees safely but slowly Standing Unsupported, Alternately Place Feet on Step/Stool: Needs assistance to keep from falling or unable to try Standing Unsupported, One Foot in Front: Able to place foot tandem independently and hold 30 seconds Standing on One Leg: Unable to try or  needs assist to prevent fall Total Score: 39 Dynamic Gait Index Level Surface: Mild Impairment Change in Gait Speed: Moderate Impairment Gait with Horizontal Head Turns: Mild Impairment Gait with Vertical Head Turns: Mild Impairment Gait and Pivot Turn: Mild Impairment Step Over Obstacle: Mild Impairment Step Around Obstacles: Normal       Pertinent Vitals/Pain Pain Assessment: 0-10 Pain Score: 2  Pain Location: R neck (chronic pain) Pain Descriptors / Indicators: Aching Pain Intervention(s): Limited activity within patient's tolerance;Monitored during session    Home Living Family/patient expects to be discharged to:: Private residence Living Arrangements: Alone   Type of Home: House Home Access: Stairs to enter Entrance Stairs-Rails: None Entrance Stairs-Number of Steps: 1 Home Layout: One level Home Equipment: Cane - single point      Prior Function Level of Independence: Independent         Comments: Pt denies any falls in the past 6 months.  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  Independent with all ADLs including cooking, cleaning, dressing, bathing, driving.      Hand Dominance        Extremity/Trunk Assessment   Upper Extremity Assessment Upper Extremity Assessment: RUE deficits/detail RUE Deficits / Details: RUE strength grossly 4/5    Lower Extremity Assessment Lower Extremity Assessment: (BLE strength grossly 4-/5)    Cervical / Trunk Assessment Cervical / Trunk Assessment: Kyphotic;Other exceptions Cervical / Trunk Exceptions: h/o chronic back pain  Communication   Communication: No difficulties  Cognition Arousal/Alertness: Awake/alert Behavior During Therapy: WFL for tasks assessed/performed Overall Cognitive Status: Within Functional Limits for tasks assessed                                        General Comments General comments (skin integrity, edema, etc.): Pt scored a 38/56 on the Berg  Balance Test, indicating pt is at an increased risk of falling.  Pt educated on results of test and implications as well as recommendation for OPPT.     Exercises     Assessment/Plan    PT Assessment Patient needs continued PT services  PT Problem List Decreased strength;Decreased balance;Decreased activity tolerance;Pain;Decreased safety awareness       PT Treatment Interventions DME instruction;Gait training;Stair training;Functional mobility training;Therapeutic activities;Therapeutic exercise;Balance training;Neuromuscular re-education;Patient/family education    PT Goals (Current goals can be found in the Care Plan section)  Acute Rehab PT Goals Patient Stated Goal: to return to PLOF PT Goal Formulation: With patient Time For Goal Achievement: 11/15/17 Potential to Achieve Goals: Good    Frequency Min 2X/week   Barriers to discharge        Co-evaluation               AM-PAC PT "6 Clicks" Daily Activity  Outcome Measure Difficulty turning over in bed (including adjusting bedclothes, sheets and blankets)?: None Difficulty moving from lying on back to sitting on the side of the bed? : None Difficulty sitting down on and standing up from a chair with arms (e.g., wheelchair, bedside commode, etc,.)?: None Help needed moving to and from a  bed to chair (including a wheelchair)?: A Little Help needed walking in hospital room?: A Little Help needed climbing 3-5 steps with a railing? : A Little 6 Click Score: 21    End of Session Equipment Utilized During Treatment: Gait belt Activity Tolerance: Patient tolerated treatment well Patient left: in bed;with call bell/phone within reach;Other (comment)(sitting EOB) Nurse Communication: Mobility status PT Visit Diagnosis: Unsteadiness on feet (R26.81);Other abnormalities of gait and mobility (R26.89);Muscle weakness (generalized) (M62.81);Pain Pain - Right/Left: (mid) Pain - part of body: (back)    Time: [id number] PT Time  Calculation (min) (ACUTE ONLY): 14 min   Charges:   PT Evaluation $PT Eval Low Complexity: 1 Low     PT G Codes:        Encarnacion ChuAshley Veer Elamin PT, DPT 11/01/2017, 9:49 AM

## 2017-11-02 ENCOUNTER — Telehealth: Payer: Self-pay | Admitting: *Deleted

## 2017-11-02 NOTE — Telephone Encounter (Addendum)
Transition Care Management Follow-up Telephone Call  This writer spoke with son as could not reach patient by phone.  He provides the following information and will have patient call tomorrow to confirm.   Date discharged? 11/01/17   How have you been since you were released from the hospital? Per son, she is doing okay and his sister is staying with her to provide assistance.    Do you understand why you were in the hospital? Yes, son and patient understand.   Do you understand the discharge instructions? Yes   Where were you discharged to? Home without home health/PT   Items Reviewed:  Medications reviewed: Yes  Allergies reviewed: Yes  Dietary changes reviewed: Yes, no changes  Referrals reviewed: Yes, patient is to see PCP this week and urologist on 11/08/17 for follow up.   Functional Questionnaire:  Activities of Daily Living (ADLs):   She states they are independent in the following: dressing, bathing, toileting, grooming, feeding, ambulation ad lib with cane as needed.   States they require assistance with the following:  Daughter helps with shopping, meal prep and is available for stand by assist with any ADL as needed.    Any transportation issues/concerns?: No, daughter/son assist with transportation   Any patient concerns? Yes, discussed her concerns for hypotension and patient  is questioning whether she should be taking her bp medication.  Patient currently does not have a blood pressure meter but will have daughter pick one up today and begin regular checks.  Parameters given to hold medication and call MD if bp is less than 100/60.   In addition, she should also call if bp running higher than 150/100 or she is experiencing any concerning side effects such as dizziness, light headedness, weakness or confusion.  And if sx's severe go back to ER. Otherwise, patient should take all medications as instructed by the hospital at discharge. *Note: patient is starting on  tamsulosin which can also have a hypotensive effect, patient and daughter made aware and will be monitoring pressures closely.  I will forward this communication to PCP to see if any further recommendations are needed.      Confirmed importance and date/time of follow-up visits scheduled Yes  Provider Appointment booked with Dr. Sharen HonesGutierrez for 11/05/17 at 2:15pm  Confirmed with patient if condition begins to worsen call PCP or go to the ER.  Patient was given the office number and encouraged to call back with question or concerns.  : Yes

## 2017-11-02 NOTE — Telephone Encounter (Signed)
Lm requesting return call to complete TCM and confirm hosp f/u appt  

## 2017-11-02 NOTE — Telephone Encounter (Signed)
Called again to reach patient to complete TCM and confirm hosp f/u appt.  Patient did not answer, LM (okay per DPR), for patient to call back.

## 2017-11-03 NOTE — Telephone Encounter (Signed)
Spoke with patient and completed TCM Call. (see below).

## 2017-11-03 NOTE — Telephone Encounter (Signed)
Agree with this. Thank you.  

## 2017-11-04 LAB — CULTURE, BLOOD (ROUTINE X 2)
Culture: NO GROWTH
Culture: NO GROWTH
Special Requests: ADEQUATE
Special Requests: ADEQUATE

## 2017-11-05 ENCOUNTER — Ambulatory Visit: Payer: Medicare Other | Admitting: Family Medicine

## 2017-11-05 ENCOUNTER — Encounter: Payer: Self-pay | Admitting: Family Medicine

## 2017-11-05 VITALS — BP 126/70 | HR 73 | Temp 98.2°F | Wt 124.0 lb

## 2017-11-05 DIAGNOSIS — B9620 Unspecified Escherichia coli [E. coli] as the cause of diseases classified elsewhere: Secondary | ICD-10-CM

## 2017-11-05 DIAGNOSIS — I10 Essential (primary) hypertension: Secondary | ICD-10-CM

## 2017-11-05 DIAGNOSIS — N390 Urinary tract infection, site not specified: Secondary | ICD-10-CM

## 2017-11-05 DIAGNOSIS — N201 Calculus of ureter: Secondary | ICD-10-CM | POA: Diagnosis not present

## 2017-11-05 DIAGNOSIS — M7989 Other specified soft tissue disorders: Secondary | ICD-10-CM | POA: Diagnosis not present

## 2017-11-05 HISTORY — DX: Urinary tract infection, site not specified: N39.0

## 2017-11-05 HISTORY — DX: Unspecified Escherichia coli (E. coli) as the cause of diseases classified elsewhere: B96.20

## 2017-11-05 NOTE — Assessment & Plan Note (Signed)
With obstruction and complicating E coli urosepsis s/p ICU stay. She did have ureteral stent placed by urology, kidney stone remains with planned outpatient management. She has f/u scheduled with Dr Lonna CobbStoioff next week. Encouraged finishing keflex, avoiding bladder irritants, and good fluid hydration status.

## 2017-11-05 NOTE — Assessment & Plan Note (Signed)
Chronic. Continue to monitor. Not consistent with DVT. She had pelvic imaging (noncontrasted) which was unrevealing. Discussed further eval with venous study if pt desired.

## 2017-11-05 NOTE — Progress Notes (Signed)
BP 126/70 (BP Location: Left Arm, Patient Position: Sitting, Cuff Size: Normal)   Pulse 73   Temp 98.2 F (36.8 C) (Oral)   Wt 124 lb (56.2 kg)   SpO2 100%   BMI 24.22 kg/m    CC: hosp f/u visit Subjective:    Patient ID: Brandy Young, female    DOB: 06/11/1946, 72 y.o.   MRN: [id number]  HPI: Brandy Young is a 72 y.o. female presenting on 11/05/2017 for Hospitalization Follow-up (Admitted to [facility] on 10/29/17, dx kidney stones. Have not passed stone(s). Pt accompanied by daughter.) Vernona Rieger   Recent hospitalization for kidney stones complicated by E coli UTI and urosepsis. Stayed in ICU, IVF and IV rocephin/ancef transitioned to oral keflex. She also had L ureteral stent placed, stone was not extracted. Planned f/u with urology on Monday. Records reviewed. She had never had kidney stones before. Treating with keflex 500mg  tid, flomax daily, and oxybutynin 5mg  TID prn.   Since discharge, concern for hypotension - she has started measuring blood pressures and recent readings include 140/83, 151/73, 138/79, 150/83.   Date of admission: 10/29/2017  Date of discharge: 11/01/2017 TCM f/u phone call completed 11/02/2017   D/C Diagnosis:  Left ureteral calculus Flank pain E coli UTI with sepsis  Relevant past medical, surgical, family and social history reviewed and updated as indicated. Interim medical history since our last visit reviewed. Allergies and medications reviewed and updated. Discharge medication list reconciled with current medication list.  Outpatient Medications Prior to Visit  Medication Sig Dispense Refill  . calcium carbonate (OS-CAL) 600 MG TABS tablet Take 600 mg by mouth 2 (two) times daily with a meal.    . cephALEXin (KEFLEX) 500 MG capsule Take 1 capsule (500 mg total) by mouth 3 (three) times daily for 10 days. 21 capsule 0  . cetirizine (ZYRTEC) 10 MG tablet Take 10 mg by mouth daily.    . ferrous sulfate 325 (65 FE) MG tablet Take 1 tablet (325 mg  total) by mouth daily with breakfast.  3  . HYDROcodone-acetaminophen (NORCO) 7.5-325 MG per tablet Take 1 tablet by mouth 3 (three) times daily as needed for moderate pain. (Dr Yves Dill)    . ibuprofen (ADVIL,MOTRIN) 200 MG tablet Take 200 mg by mouth every 6 (six) hours as needed.    Marland Kitchen lisinopril (PRINIVIL,ZESTRIL) 10 MG tablet Take 1 tablet (10 mg total) by mouth daily. 90 tablet 3  . Magnesium 250 MG TABS Take 1 tablet by mouth 2 (two) times daily.    . Omega-3 Fatty Acids (FISH OIL) 1200 MG CAPS Take 1 capsule by mouth 2 (two) times daily.    Marland Kitchen omeprazole (PRILOSEC) 40 MG capsule Take 1 capsule (40 mg total) by mouth daily. 30 capsule 6  . oxybutynin (DITROPAN) 5 MG tablet Take 1 tablet (5 mg total) by mouth 3 (three) times daily. 90 tablet 0  . psyllium (METAMUCIL) 58.6 % powder Take 1 packet by mouth daily.    Marland Kitchen senna-docusate (SENOKOT-S) 8.6-50 MG tablet Take 1 tablet by mouth at bedtime as needed for mild constipation. (Patient taking differently: Take 1 tablet by mouth at bedtime. ) 30 tablet 0  . simvastatin (ZOCOR) 10 MG tablet Take 1 tablet (10 mg total) by mouth daily. 90 tablet 3  . tamsulosin (FLOMAX) 0.4 MG CAPS capsule Take 1 capsule (0.4 mg total) by mouth daily. 30 capsule 3  . vitamin B-12 (CYANOCOBALAMIN) 500 MCG tablet Take 1 tablet (500 mcg total) by mouth daily.  No facility-administered medications prior to visit.      Per HPI unless specifically indicated in ROS section below Review of Systems     Objective:    BP 126/70 (BP Location: Left Arm, Patient Position: Sitting, Cuff Size: Normal)   Pulse 73   Temp 98.2 F (36.8 C) (Oral)   Wt 124 lb (56.2 kg)   SpO2 100%   BMI 24.22 kg/m   Wt Readings from Last 3 Encounters:  11/05/17 124 lb (56.2 kg)  10/30/17 134 lb 14.7 oz (61.2 kg)  10/08/17 124 lb (56.2 kg)    Physical Exam  Constitutional: She appears well-developed and well-nourished. No distress.  HENT:  Head: Normocephalic and atraumatic.    Mouth/Throat: Oropharynx is clear and moist. No oropharyngeal exudate.  Eyes: Conjunctivae and EOM are normal. Pupils are equal, round, and reactive to light. No scleral icterus.  Cardiovascular: Normal rate, regular rhythm, normal heart sounds and intact distal pulses.  No murmur heard. Pulmonary/Chest: Effort normal and breath sounds normal. No respiratory distress. She has no wheezes. She has no rales.  Abdominal: Soft. Normal appearance and bowel sounds are normal. She exhibits no distension and no mass. There is no hepatosplenomegaly. There is no tenderness. There is no rigidity, no rebound, no guarding, no CVA tenderness and negative Murphy's sign.  Musculoskeletal: She exhibits edema (chronic L>R leg swelling, nonpititng).  Skin: Skin is warm and dry. No rash noted.  Psychiatric: She has a normal mood and affect.  Nursing note and vitals reviewed.   Lab Results  Component Value Date   CREATININE 0.62 10/31/2017   BUN 19 10/31/2017   NA 143 10/31/2017   K 4.1 10/31/2017   CL 116 (H) 10/31/2017   CO2 19 (L) 10/31/2017       Assessment & Plan:   Problem List Items Addressed This Visit    E. coli UTI    Completing treatment with keflex. Sensitive to this. Largely recovering well.       HTN (hypertension)    bp stable - continue lisinopril. Discussed importance of good hydration status on this medication. She has bought bp cuff.       Left leg swelling    Chronic. Continue to monitor. Not consistent with DVT. She had pelvic imaging (noncontrasted) which was unrevealing. Discussed further eval with venous study if pt desired.       Ureteral calculus, left - Primary    With obstruction and complicating E coli urosepsis s/p ICU stay. She did have ureteral stent placed by urology, kidney stone remains with planned outpatient management. She has f/u scheduled with Dr Lonna CobbStoioff next week. Encouraged finishing keflex, avoiding bladder irritants, and good fluid hydration status.            No orders of the defined types were placed in this encounter.  No orders of the defined types were placed in this encounter.   Follow up plan: Return if symptoms worsen or fail to improve.  Eustaquio BoydenJavier Alder Murri, MD

## 2017-11-05 NOTE — Patient Instructions (Addendum)
Blood pressure is looking better - continue current medicines. Finish keflex antibiotic Keep appointment with Dr Lonna CobbStoioff on Monday. Good to see you touday! Call us with questions.

## 2017-11-05 NOTE — Assessment & Plan Note (Signed)
Completing treatment with keflex. Sensitive to this. Largely recovering well.

## 2017-11-05 NOTE — Assessment & Plan Note (Signed)
bp stable - continue lisinopril. Discussed importance of good hydration status on this medication. She has bought bp cuff.

## 2017-11-08 ENCOUNTER — Ambulatory Visit (INDEPENDENT_AMBULATORY_CARE_PROVIDER_SITE_OTHER): Payer: Medicare Other | Admitting: Urology

## 2017-11-08 ENCOUNTER — Encounter: Payer: Self-pay | Admitting: Urology

## 2017-11-08 ENCOUNTER — Other Ambulatory Visit: Payer: Self-pay | Admitting: Radiology

## 2017-11-08 VITALS — BP 131/68 | HR 103 | Ht <= 58 in | Wt 120.0 lb

## 2017-11-08 DIAGNOSIS — N201 Calculus of ureter: Secondary | ICD-10-CM | POA: Diagnosis not present

## 2017-11-08 DIAGNOSIS — H401113 Primary open-angle glaucoma, right eye, severe stage: Secondary | ICD-10-CM | POA: Diagnosis not present

## 2017-11-08 NOTE — Progress Notes (Signed)
11/08/2017 8:55 AM   Brandy Young 1945/12/28 [id number]  Referring provider: Eustaquio Boyden, MD [address] [place], [place] [address]  Chief Complaint  Patient presents with  . Nephrolithiasis    HPI: 72 year old female presents for hospital follow-up.  She was admitted on 10/29/2017 with acute onset of severe left flank pain radiating to the left lower quadrant.  Urinalysis did show pyuria and was nitrite positive.  Stone protocol CT showed a 4 mm left distal ureteral calculus with moderate hydronephrosis/hydroureter.  She was taken to the OR for urgent stenting.  Postoperatively she did develop hypotension requiring blood pressure support and ICU transfer.  Her clinical condition significantly improved.  Blood cultures were negative.  She was discharged on 3/4.  She presents for discussion of definitive stone management.   PMH: Past Medical History:  Diagnosis Date  . Allergic rhinitis due to pollen   . DDD (degenerative disc disease), lumbar    with spinal stenosis and neurogenic claudication s/p ESI (Chasnis)  . DJD (degenerative joint disease) of knee    failed steroid and synvisc injections  . Glaucoma   . History of chicken pox   . History of stomach ulcers 2010s  . HLD (hyperlipidemia)   . HTN (hypertension)   . IDA (iron deficiency anemia) 2012   s/p normal EGD/colonoscopy (iftikhar)  . 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 Prediabetes   . Tricuspid regurgitation 2012   by echo, EF 73% Welton Flakes)    Surgical History: Past Surgical History:  Procedure Laterality Date  . APPENDECTOMY    . COLONOSCOPY  08/2011   diverticulosis, int hem (Iftikhar)  . CYSTOSCOPY W/ RETROGRADES Left 10/29/2017   Procedure: CYSTOSCOPY WITH RETROGRADE PYELOGRAM;  Surgeon: Riki Altes, MD;  Location: [facility] ORS;  Service: Urology;  Laterality: Left;  . CYSTOSCOPY WITH  STENT PLACEMENT Left 10/29/2017   Procedure: CYSTOSCOPY WITH STENT PLACEMENT;  Surgeon: Riki Altes, MD;  Location: [facility] ORS;  Service: Urology;  Laterality: Left;  . ESI  multiple, latest 03/2015   R L5/S1, L L3/4, L L4/5 L5/S1, L L4/5 and L5/S1 - minimal relief (Chasnis)  . ESOPHAGOGASTRODUODENOSCOPY  08/2011   WNL  . EYE SURGERY     laser eye surgery  . FOOT SURGERY Right   . VAGINAL HYSTERECTOMY  1980s   prolapsed uterus, ovaries remained    Home Medications:  Allergies as of 11/08/2017      Reactions   Niacin And Related Hives   Neurontin [gabapentin] Other (See Comments), Photosensitivity, Rash   Blurry vision, off balance Blurry vision, off balance      Medication List        Accurate as of 11/08/17  8:55 AM. Always use your most recent med list.          calcium carbonate 600 MG Tabs tablet Commonly known as:  OS-CAL Take 600 mg by mouth 2 (two) times daily with a meal.   cetirizine 10 MG tablet Commonly known as:  ZYRTEC Take 10 mg by mouth daily.   ferrous sulfate 325 (65 FE) MG tablet Take 1 tablet (325 mg total) by mouth daily with breakfast.   Fish Oil 1200 MG Caps Take 1 capsule by mouth 2 (two) times daily.   HYDROcodone-acetaminophen 7.5-325 MG tablet Commonly known as:  NORCO Take 1 tablet by mouth 3 (three) times daily as needed for moderate pain. (  Dr Yves Dill)   ibuprofen 200 MG tablet Commonly known as:  ADVIL,MOTRIN Take 200 mg by mouth every 6 (six) hours as needed.   lisinopril 10 MG tablet Commonly known as:  PRINIVIL,ZESTRIL Take 1 tablet (10 mg total) by mouth daily.   Magnesium 250 MG Tabs Take 1 tablet by mouth 2 (two) times daily.   omeprazole 40 MG capsule Commonly known as:  PRILOSEC Take 1 capsule (40 mg total) by mouth daily.   oxybutynin 5 MG tablet Commonly known as:  DITROPAN Take 1 tablet (5 mg total) by mouth 3 (three) times daily.   psyllium 58.6 % powder Commonly known as:  METAMUCIL Take 1 packet by mouth  daily.   senna-docusate 8.6-50 MG tablet Commonly known as:  Senokot-S Take 1 tablet by mouth at bedtime as needed for mild constipation.   simvastatin 10 MG tablet Commonly known as:  ZOCOR Take 1 tablet (10 mg total) by mouth daily.   tamsulosin 0.4 MG Caps capsule Commonly known as:  FLOMAX Take 1 capsule (0.4 mg total) by mouth daily.   vitamin B-12 500 MCG tablet Commonly known as:  CYANOCOBALAMIN Take 1 tablet (500 mcg total) by mouth daily.       Allergies:  Allergies  Allergen Reactions  . Niacin And Related Hives  . Neurontin [Gabapentin] Other (See Comments), Photosensitivity and Rash    Blurry vision, off balance Blurry vision, off balance    Family History: Family History  Problem Relation Age of Onset  . Stroke Father 48       hemorrhagic bleed  . Hypertension Father   . Cancer Mother 30       breast  . Breast cancer Mother 28  . Cancer Brother 32       lung (nonsmoker)  . Breast cancer Paternal Grandmother   . CAD Neg Hx   . Diabetes Neg Hx     Social History:  reports that  has never smoked. she has never used smokeless tobacco. She reports that she does not drink alcohol or use drugs.  ROS: UROLOGY Frequent Urination?: No Hard to postpone urination?: No Burning/pain with urination?: No Get up at night to urinate?: No Leakage of urine?: No Urine stream starts and stops?: No Trouble starting stream?: No Do you have to strain to urinate?: No Blood in urine?: No Urinary tract infection?: No Sexually transmitted disease?: No Injury to kidneys or bladder?: No Painful intercourse?: No Weak stream?: No Currently pregnant?: No Vaginal bleeding?: No Last menstrual period?: n  Gastrointestinal Nausea?: No Vomiting?: No Indigestion/heartburn?: No Diarrhea?: No Constipation?: No  Constitutional Fever: No Night sweats?: No Weight loss?: No Fatigue?: No  Skin Skin rash/lesions?: No Itching?: No  Eyes Blurred vision?: No Double  vision?: No  Ears/Nose/Throat Sore throat?: No Sinus problems?: No  Hematologic/Lymphatic Swollen glands?: No Easy bruising?: No  Cardiovascular Leg swelling?: Yes Chest pain?: No  Respiratory Cough?: No Shortness of breath?: No  Endocrine Excessive thirst?: No  Musculoskeletal Back pain?: Yes Joint pain?: No  Neurological Headaches?: No Dizziness?: No  Psychologic Depression?: No Anxiety?: No  Physical Exam: BP 131/68   Pulse (!) 103   Ht 4\' 10"  (1.473 m)   Wt 120 lb (54.4 kg)   BMI 25.08 kg/m   Constitutional:  Alert and oriented, No acute distress. HEENT: [place] AT, moist mucus membranes.  Trachea midline, no masses. Cardiovascular: No clubbing, cyanosis, or edema.  RRR Respiratory: Normal respiratory effort, no increased work of breathing.  Lungs clear GI:  Abdomen is soft, nontender, nondistended, no abdominal masses GU: No CVA tenderness Lymph: No cervical or inguinal lymphadenopathy. Skin: No rashes, bruises or suspicious lesions. Neurologic: Grossly intact, no focal deficits, moving all 4 extremities. Psychiatric: Normal mood and affect.  Laboratory Data: Lab Results  Component Value Date   WBC 26.1 (H) 10/31/2017   HGB 9.8 (L) 10/31/2017   HCT 29.8 (L) 10/31/2017   MCV 92.1 10/31/2017   PLT 147 (L) 10/31/2017    Lab Results  Component Value Date   CREATININE 0.62 10/31/2017    Lab Results  Component Value Date   HGBA1C 6.1 10/04/2017     Assessment & Plan:   72 year old female status post stent placement for an obstructing left UVJ stone with infection/sepsis.  She is currently doing well.  I have recommended scheduling follow-up cystoscopy with stent removal and left ureteroscopy.  The indications and nature of the planned procedure were discussed as well as the potential  benefits and expected outcome.  Alternatives have been discussed in detail. The most common complications and side effects were discussed including but not limited to  infection/sepsis; blood loss; damage to urethra, bladder, ureter; need for multiple surgeries; need for prolonged stent placement as well as general anesthesia risks.  All of her questions were answered and she desires to proceed.  At the time of our visit he she did not appear to be distracted or in pain.    Riki AltesScott C Aissa Lisowski, MD  [facility] [address], [address] [place], [place] [address] [phone]

## 2017-11-08 NOTE — H&P (View-Only) (Signed)
11/08/2017 8:55 AM   Brandy Young 1945/12/28 [id number]  Referring provider: Eustaquio Boyden, MD [address] [place], [place] [address]  Chief Complaint  Patient presents with  . Nephrolithiasis    HPI: 72 year old female presents for hospital follow-up.  She was admitted on 10/29/2017 with acute onset of severe left flank pain radiating to the left lower quadrant.  Urinalysis did show pyuria and was nitrite positive.  Stone protocol CT showed a 4 mm left distal ureteral calculus with moderate hydronephrosis/hydroureter.  She was taken to the OR for urgent stenting.  Postoperatively she did develop hypotension requiring blood pressure support and ICU transfer.  Her clinical condition significantly improved.  Blood cultures were negative.  She was discharged on 3/4.  She presents for discussion of definitive stone management.   PMH: Past Medical History:  Diagnosis Date  . Allergic rhinitis due to pollen   . DDD (degenerative disc disease), lumbar    with spinal stenosis and neurogenic claudication s/p ESI (Chasnis)  . DJD (degenerative joint disease) of knee    failed steroid and synvisc injections  . Glaucoma   . History of chicken pox   . History of stomach ulcers 2010s  . HLD (hyperlipidemia)   . HTN (hypertension)   . IDA (iron deficiency anemia) 2012   s/p normal EGD/colonoscopy (iftikhar)  . 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 Prediabetes   . Tricuspid regurgitation 2012   by echo, EF 73% Welton Flakes)    Surgical History: Past Surgical History:  Procedure Laterality Date  . APPENDECTOMY    . COLONOSCOPY  08/2011   diverticulosis, int hem (Iftikhar)  . CYSTOSCOPY W/ RETROGRADES Left 10/29/2017   Procedure: CYSTOSCOPY WITH RETROGRADE PYELOGRAM;  Surgeon: Riki Altes, MD;  Location: [facility] ORS;  Service: Urology;  Laterality: Left;  . CYSTOSCOPY WITH  STENT PLACEMENT Left 10/29/2017   Procedure: CYSTOSCOPY WITH STENT PLACEMENT;  Surgeon: Riki Altes, MD;  Location: [facility] ORS;  Service: Urology;  Laterality: Left;  . ESI  multiple, latest 03/2015   R L5/S1, L L3/4, L L4/5 L5/S1, L L4/5 and L5/S1 - minimal relief (Chasnis)  . ESOPHAGOGASTRODUODENOSCOPY  08/2011   WNL  . EYE SURGERY     laser eye surgery  . FOOT SURGERY Right   . VAGINAL HYSTERECTOMY  1980s   prolapsed uterus, ovaries remained    Home Medications:  Allergies as of 11/08/2017      Reactions   Niacin And Related Hives   Neurontin [gabapentin] Other (See Comments), Photosensitivity, Rash   Blurry vision, off balance Blurry vision, off balance      Medication List        Accurate as of 11/08/17  8:55 AM. Always use your most recent med list.          calcium carbonate 600 MG Tabs tablet Commonly known as:  OS-CAL Take 600 mg by mouth 2 (two) times daily with a meal.   cetirizine 10 MG tablet Commonly known as:  ZYRTEC Take 10 mg by mouth daily.   ferrous sulfate 325 (65 FE) MG tablet Take 1 tablet (325 mg total) by mouth daily with breakfast.   Fish Oil 1200 MG Caps Take 1 capsule by mouth 2 (two) times daily.   HYDROcodone-acetaminophen 7.5-325 MG tablet Commonly known as:  NORCO Take 1 tablet by mouth 3 (three) times daily as needed for moderate pain. (  Dr Yves Dill)   ibuprofen 200 MG tablet Commonly known as:  ADVIL,MOTRIN Take 200 mg by mouth every 6 (six) hours as needed.   lisinopril 10 MG tablet Commonly known as:  PRINIVIL,ZESTRIL Take 1 tablet (10 mg total) by mouth daily.   Magnesium 250 MG Tabs Take 1 tablet by mouth 2 (two) times daily.   omeprazole 40 MG capsule Commonly known as:  PRILOSEC Take 1 capsule (40 mg total) by mouth daily.   oxybutynin 5 MG tablet Commonly known as:  DITROPAN Take 1 tablet (5 mg total) by mouth 3 (three) times daily.   psyllium 58.6 % powder Commonly known as:  METAMUCIL Take 1 packet by mouth  daily.   senna-docusate 8.6-50 MG tablet Commonly known as:  Senokot-S Take 1 tablet by mouth at bedtime as needed for mild constipation.   simvastatin 10 MG tablet Commonly known as:  ZOCOR Take 1 tablet (10 mg total) by mouth daily.   tamsulosin 0.4 MG Caps capsule Commonly known as:  FLOMAX Take 1 capsule (0.4 mg total) by mouth daily.   vitamin B-12 500 MCG tablet Commonly known as:  CYANOCOBALAMIN Take 1 tablet (500 mcg total) by mouth daily.       Allergies:  Allergies  Allergen Reactions  . Niacin And Related Hives  . Neurontin [Gabapentin] Other (See Comments), Photosensitivity and Rash    Blurry vision, off balance Blurry vision, off balance    Family History: Family History  Problem Relation Age of Onset  . Stroke Father 48       hemorrhagic bleed  . Hypertension Father   . Cancer Mother 30       breast  . Breast cancer Mother 28  . Cancer Brother 32       lung (nonsmoker)  . Breast cancer Paternal Grandmother   . CAD Neg Hx   . Diabetes Neg Hx     Social History:  reports that  has never smoked. she has never used smokeless tobacco. She reports that she does not drink alcohol or use drugs.  ROS: UROLOGY Frequent Urination?: No Hard to postpone urination?: No Burning/pain with urination?: No Get up at night to urinate?: No Leakage of urine?: No Urine stream starts and stops?: No Trouble starting stream?: No Do you have to strain to urinate?: No Blood in urine?: No Urinary tract infection?: No Sexually transmitted disease?: No Injury to kidneys or bladder?: No Painful intercourse?: No Weak stream?: No Currently pregnant?: No Vaginal bleeding?: No Last menstrual period?: n  Gastrointestinal Nausea?: No Vomiting?: No Indigestion/heartburn?: No Diarrhea?: No Constipation?: No  Constitutional Fever: No Night sweats?: No Weight loss?: No Fatigue?: No  Skin Skin rash/lesions?: No Itching?: No  Eyes Blurred vision?: No Double  vision?: No  Ears/Nose/Throat Sore throat?: No Sinus problems?: No  Hematologic/Lymphatic Swollen glands?: No Easy bruising?: No  Cardiovascular Leg swelling?: Yes Chest pain?: No  Respiratory Cough?: No Shortness of breath?: No  Endocrine Excessive thirst?: No  Musculoskeletal Back pain?: Yes Joint pain?: No  Neurological Headaches?: No Dizziness?: No  Psychologic Depression?: No Anxiety?: No  Physical Exam: BP 131/68   Pulse (!) 103   Ht 4\' 10"  (1.473 m)   Wt 120 lb (54.4 kg)   BMI 25.08 kg/m   Constitutional:  Alert and oriented, No acute distress. HEENT: [place] AT, moist mucus membranes.  Trachea midline, no masses. Cardiovascular: No clubbing, cyanosis, or edema.  RRR Respiratory: Normal respiratory effort, no increased work of breathing.  Lungs clear GI:  Abdomen is soft, nontender, nondistended, no abdominal masses GU: No CVA tenderness Lymph: No cervical or inguinal lymphadenopathy. Skin: No rashes, bruises or suspicious lesions. Neurologic: Grossly intact, no focal deficits, moving all 4 extremities. Psychiatric: Normal mood and affect.  Laboratory Data: Lab Results  Component Value Date   WBC 26.1 (H) 10/31/2017   HGB 9.8 (L) 10/31/2017   HCT 29.8 (L) 10/31/2017   MCV 92.1 10/31/2017   PLT 147 (L) 10/31/2017    Lab Results  Component Value Date   CREATININE 0.62 10/31/2017    Lab Results  Component Value Date   HGBA1C 6.1 10/04/2017     Assessment & Plan:   72 year old female status post stent placement for an obstructing left UVJ stone with infection/sepsis.  She is currently doing well.  I have recommended scheduling follow-up cystoscopy with stent removal and left ureteroscopy.  The indications and nature of the planned procedure were discussed as well as the potential  benefits and expected outcome.  Alternatives have been discussed in detail. The most common complications and side effects were discussed including but not limited to  infection/sepsis; blood loss; damage to urethra, bladder, ureter; need for multiple surgeries; need for prolonged stent placement as well as general anesthesia risks.  All of her questions were answered and she desires to proceed.  At the time of our visit he she did not appear to be distracted or in pain.    Riki AltesScott C Stoioff, MD  [facility] [address], [address] [place], [place] [address] [phone]

## 2017-11-15 ENCOUNTER — Encounter: Payer: Self-pay | Admitting: Urology

## 2017-11-15 ENCOUNTER — Telehealth: Payer: Self-pay | Admitting: Urology

## 2017-11-15 NOTE — Telephone Encounter (Signed)
Pt called and left message with questions about some symptoms she's having w/kidney stones and wants to know if it's normal.  Please give her a call at [phone]

## 2017-11-16 ENCOUNTER — Ambulatory Visit
Admission: RE | Admit: 2017-11-16 | Discharge: 2017-11-16 | Disposition: A | Discharge status: Home / Self Care | Payer: Medicare Other | Source: Ambulatory Visit | Attending: Family Medicine | Admitting: Family Medicine

## 2017-11-16 DIAGNOSIS — M85852 Other specified disorders of bone density and structure, left thigh: Secondary | ICD-10-CM | POA: Insufficient documentation

## 2017-11-16 DIAGNOSIS — E2839 Other primary ovarian failure: Secondary | ICD-10-CM | POA: Diagnosis present

## 2017-11-16 DIAGNOSIS — M85832 Other specified disorders of bone density and structure, left forearm: Secondary | ICD-10-CM | POA: Diagnosis not present

## 2017-11-17 ENCOUNTER — Other Ambulatory Visit: Payer: Self-pay | Admitting: Radiology

## 2017-11-17 NOTE — Telephone Encounter (Signed)
Spoke with pt in reference to concerns with a kidney stone. Pt voiced understanding.

## 2017-11-20 ENCOUNTER — Encounter: Payer: Self-pay | Admitting: Family Medicine

## 2017-11-20 DIAGNOSIS — M8580 Other specified disorders of bone density and structure, unspecified site: Secondary | ICD-10-CM | POA: Insufficient documentation

## 2017-11-26 ENCOUNTER — Other Ambulatory Visit: Payer: Self-pay

## 2017-11-26 ENCOUNTER — Encounter
Admission: RE | Admit: 2017-11-26 | Discharge: 2017-11-26 | Disposition: A | Discharge status: Home / Self Care | Payer: Medicare Other | Source: Ambulatory Visit | Attending: Urology | Admitting: Urology

## 2017-11-26 DIAGNOSIS — I10 Essential (primary) hypertension: Secondary | ICD-10-CM | POA: Diagnosis not present

## 2017-11-26 DIAGNOSIS — N202 Calculus of kidney with calculus of ureter: Secondary | ICD-10-CM | POA: Diagnosis not present

## 2017-11-26 DIAGNOSIS — Z01818 Encounter for other preprocedural examination: Secondary | ICD-10-CM | POA: Insufficient documentation

## 2017-11-26 DIAGNOSIS — Z960 Presence of urogenital implants: Secondary | ICD-10-CM | POA: Insufficient documentation

## 2017-11-26 HISTORY — DX: Gastro-esophageal reflux disease without esophagitis: K21.9

## 2017-11-26 HISTORY — DX: Other primary ovarian failure: E28.39

## 2017-11-26 HISTORY — DX: Myoneural disorder, unspecified: G70.9

## 2017-11-26 HISTORY — DX: Personal history of urinary calculi: Z87.442

## 2017-11-26 HISTORY — DX: Adverse effect of unspecified anesthetic, initial encounter: T41.45XA

## 2017-11-26 LAB — CBC WITH DIFFERENTIAL/PLATELET
Basophils Absolute: 0 K/uL (ref 0–0.1)
Basophils Relative: 0 %
Eosinophils Absolute: 0.5 K/uL (ref 0–0.7)
Eosinophils Relative: 6 %
HCT: 35.1 % (ref 35.0–47.0)
Hemoglobin: 11.4 g/dL — ABNORMAL LOW (ref 12.0–16.0)
Lymphocytes Relative: 14 %
Lymphs Abs: 1.2 K/uL (ref 1.0–3.6)
MCH: 30.1 pg (ref 26.0–34.0)
MCHC: 32.4 g/dL (ref 32.0–36.0)
MCV: 92.6 fL (ref 80.0–100.0)
Monocytes Absolute: 0.9 K/uL (ref 0.2–0.9)
Monocytes Relative: 10 %
Neutro Abs: 6.4 K/uL (ref 1.4–6.5)
Neutrophils Relative %: 70 %
Platelets: 377 K/uL (ref 150–440)
RBC: 3.79 MIL/uL — ABNORMAL LOW (ref 3.80–5.20)
RDW: 13.5 % (ref 11.5–14.5)
WBC: 9.1 K/uL (ref 3.6–11.0)

## 2017-11-26 LAB — BASIC METABOLIC PANEL
ANION GAP: 9 (ref 5–15)
BUN: 26 mg/dL — ABNORMAL HIGH (ref 6–20)
CALCIUM: 9 mg/dL (ref 8.9–10.3)
CO2: 22 mmol/L (ref 22–32)
Chloride: 105 mmol/L (ref 101–111)
Creatinine, Ser: 0.73 mg/dL (ref 0.44–1.00)
GLUCOSE: 101 mg/dL — AB (ref 65–99)
POTASSIUM: 3.5 mmol/L (ref 3.5–5.1)
Sodium: 136 mmol/L (ref 135–145)

## 2017-11-26 NOTE — Patient Instructions (Addendum)
Your procedure is scheduled on: Friday, April 5TH  Report to  THE SECOND FLOOR OF THE MEDICAL MALL  To find out your arrival time please call [phone] between 1PM - 3PM                    on Thursday, April 4TH  Remember: Instructions that are not followed completely may result in serious medical  risk, up to and including death, or upon the discretion of your surgeon and  anesthesiologist your surgery may need to be rescheduled.     _X__ 1. Do not eat food after midnight the night before your procedure.                 No gum chewing, lozengers, tic tac or hard candies.                   You may drink clear liquids up to 2 hours                 before you are scheduled to arrive for your surgery-                   DO not drink clear liquids within 2 hours of the start of your surgery.                  Clear Liquids include:  water, apple juice without pulp, clear carbohydrate                 drink such as Clearfast of Gatorade, Black Coffee or Tea (Do not add                 anything to coffee or tea).  __X__2.  On the morning of surgery brush your teeth with toothpaste and water,                         you may rinse your mouth with mouthwash if you wish.                               Do not swallow any toothpaste of mouthwash.     _X__ 3.  No Alcohol for 24 hours before or after surgery.   _X__ 4.  Do Not Smoke or use e-cigarettes For 24 Hours Prior to Your Surgery.                 Do not use any chewable tobacco products for at least 6 hours prior to                 surgery.  ____  5.  Bring all medications with you on the day of surgery if instructed.   _x___  6.  Notify your doctor if there is any change in your medical condition      (cold, fever, infections).     Do not wear jewelry, make-up, hairpins, clips or nail polish. Do not wear lotions, powders, or perfumes. You may wear deodorant. Do not shave 48 hours prior to surgery. Men may  shave face and neck. Do not bring valuables to the hospital.    [facility] is not responsible for any belongings or valuables.  Contacts, dentures or bridgework may not be worn into surgery. Leave your suitcase in the car. After surgery it may be brought to your room. For patients admitted to the hospital,  discharge time is determined by your treatment team.   Patients discharged the day of surgery will not be allowed to drive home.   Please read over the following fact sheets that you were given:   PREPARING FOR YOUR SURGERY   ____ Take these medicines the morning of surgery with A SIP OF WATER:    1. OMEPRAZOLE  2. NORCO  3. ZYRTEC  4. FLOMAX  5.  6.  ____ Fleet Enema (as directed)   _X___ Use ANTIBACTERIAL Soap as directed  ____ Use inhalers on the day of surgery  _X___ Stop ALL ASPIRIN PRODUCTS TODAY!!  __X__ Stop Anti-inflammatories AS OF NOW!!   __X__ Stop supplements until after surgery.                  THIS INCLUDES CALCIUM, IRON, FISH OIL  ____ Bring C-Pap to the hospital.   CONTINUE TO TAKE:                LISINOPRIL                MAGNESIUM                METAMUCIL                ZOCOR                SENNA                OXYBUTYNIN                        BUT NOT ON THE MORNING OF SURGERY  BRING A COPY OF YOUR POWER OF ATTORNEY PAPERS SO           WE CAN MAKE A COPY FOR YOUR RECORDS.  WEAR COMFORTABLE CLOTHES

## 2017-11-26 NOTE — Pre-Procedure Instructions (Signed)
Patient sent home with supplies to obtain a urine sample and bring back on Monday.

## 2017-12-01 ENCOUNTER — Other Ambulatory Visit: Payer: Self-pay | Admitting: Radiology

## 2017-12-01 ENCOUNTER — Telehealth: Payer: Self-pay | Admitting: Radiology

## 2017-12-01 DIAGNOSIS — N390 Urinary tract infection, site not specified: Secondary | ICD-10-CM

## 2017-12-01 MED ORDER — CEFUROXIME AXETIL 500 MG PO TABS: 500.0000 mg | ORAL_TABLET | Freq: Two times a day (BID) | ORAL | 0 refills | Status: DC

## 2017-12-01 NOTE — Telephone Encounter (Signed)
Notified pt of +ucx & script sent to pharmacy. Pt voices understanding.

## 2017-12-01 NOTE — Telephone Encounter (Signed)
-----   Message from Riki AltesScott C Stoioff, MD sent at 12/01/2017  1:27 PM EDT ----- Preliminary urine culture is positive.  Since she is scheduled for this Friday let us go ahead and start Ceftin 500 mg twice daily times 7 days.

## 2017-12-02 MED ORDER — CEFAZOLIN SODIUM-DEXTROSE 2-4 GM/100ML-% IV SOLN
2.0000 g | INTRAVENOUS | Status: AC
  Administered 2017-12-03: 2 g via INTRAVENOUS

## 2017-12-03 ENCOUNTER — Telehealth: Payer: Self-pay

## 2017-12-03 ENCOUNTER — Telehealth: Payer: Self-pay | Admitting: Urology

## 2017-12-03 ENCOUNTER — Ambulatory Visit: Payer: Medicare Other | Admitting: Certified Registered"

## 2017-12-03 ENCOUNTER — Ambulatory Visit
Admission: RE | Admit: 2017-12-03 | Discharge: 2017-12-03 | Disposition: A | Discharge status: Home / Self Care | Payer: Medicare Other | Source: Ambulatory Visit | Attending: Urology | Admitting: Urology

## 2017-12-03 ENCOUNTER — Encounter
Admission: RE | Disposition: A | Discharge status: Home / Self Care | Payer: Self-pay | Source: Ambulatory Visit | Attending: Urology

## 2017-12-03 DIAGNOSIS — K219 Gastro-esophageal reflux disease without esophagitis: Secondary | ICD-10-CM | POA: Diagnosis not present

## 2017-12-03 DIAGNOSIS — I10 Essential (primary) hypertension: Secondary | ICD-10-CM | POA: Diagnosis not present

## 2017-12-03 DIAGNOSIS — I071 Rheumatic tricuspid insufficiency: Secondary | ICD-10-CM | POA: Insufficient documentation

## 2017-12-03 DIAGNOSIS — E785 Hyperlipidemia, unspecified: Secondary | ICD-10-CM | POA: Insufficient documentation

## 2017-12-03 DIAGNOSIS — N200 Calculus of kidney: Secondary | ICD-10-CM | POA: Diagnosis not present

## 2017-12-03 DIAGNOSIS — N201 Calculus of ureter: Secondary | ICD-10-CM

## 2017-12-03 DIAGNOSIS — Z79899 Other long term (current) drug therapy: Secondary | ICD-10-CM | POA: Insufficient documentation

## 2017-12-03 DIAGNOSIS — J301 Allergic rhinitis due to pollen: Secondary | ICD-10-CM | POA: Diagnosis not present

## 2017-12-03 DIAGNOSIS — D509 Iron deficiency anemia, unspecified: Secondary | ICD-10-CM | POA: Diagnosis not present

## 2017-12-03 HISTORY — PX: CYSTOSCOPY WITH URETEROSCOPY: SHX5123

## 2017-12-03 HISTORY — PX: CYSTOSCOPY W/ URETERAL STENT PLACEMENT: SHX1429

## 2017-12-03 LAB — GLUCOSE, CAPILLARY: GLUCOSE-CAPILLARY: 94 mg/dL (ref 65–99)

## 2017-12-03 SURGERY — CYSTOSCOPY WITH URETEROSCOPY
Anesthesia: General | Laterality: Left

## 2017-12-03 MED ORDER — SODIUM CHLORIDE 0.9 % IV SOLN
INTRAVENOUS | Status: DC
  Administered 2017-12-03: 07:00:00 via INTRAVENOUS

## 2017-12-03 MED ORDER — ROCURONIUM BROMIDE 100 MG/10ML IV SOLN
INTRAVENOUS | Status: DC | PRN
  Administered 2017-12-03: 5 mg via INTRAVENOUS

## 2017-12-03 MED ORDER — SUCCINYLCHOLINE CHLORIDE 20 MG/ML IJ SOLN
INTRAMUSCULAR | Status: AC
  Filled 2017-12-03: qty 1

## 2017-12-03 MED ORDER — ROCURONIUM BROMIDE 50 MG/5ML IV SOLN
INTRAVENOUS | Status: AC
Start: 2017-12-03 — End: ?
  Filled 2017-12-03: qty 1

## 2017-12-03 MED ORDER — FENTANYL CITRATE (PF) 100 MCG/2ML IJ SOLN
INTRAMUSCULAR | Status: AC
  Filled 2017-12-03: qty 2

## 2017-12-03 MED ORDER — IOTHALAMATE MEGLUMINE 43 % IV SOLN
INTRAVENOUS | Status: DC | PRN
  Administered 2017-12-03: 30 mL

## 2017-12-03 MED ORDER — LIDOCAINE HCL (CARDIAC) 20 MG/ML IV SOLN
INTRAVENOUS | Status: DC | PRN
  Administered 2017-12-03: 60 mg via INTRAVENOUS

## 2017-12-03 MED ORDER — FENTANYL CITRATE (PF) 100 MCG/2ML IJ SOLN
INTRAMUSCULAR | Status: DC | PRN
  Administered 2017-12-03: 25 ug via INTRAVENOUS
  Administered 2017-12-03: 50 ug via INTRAVENOUS

## 2017-12-03 MED ORDER — PROPOFOL 10 MG/ML IV BOLUS
INTRAVENOUS | Status: DC | PRN
  Administered 2017-12-03: 80 mg via INTRAVENOUS

## 2017-12-03 MED ORDER — OXYBUTYNIN CHLORIDE 5 MG PO TABS: 5.0000 mg | ORAL_TABLET | Freq: Three times a day (TID) | ORAL | 0 refills | Status: DC

## 2017-12-03 MED ORDER — ONDANSETRON HCL 4 MG/2ML IJ SOLN
INTRAMUSCULAR | Status: DC | PRN
  Administered 2017-12-03: 4 mg via INTRAVENOUS

## 2017-12-03 MED ORDER — ONDANSETRON HCL 4 MG/2ML IJ SOLN: 4.0000 mg | Freq: Once | INTRAMUSCULAR | Status: DC | PRN

## 2017-12-03 MED ORDER — LIDOCAINE HCL (PF) 2 % IJ SOLN
INTRAMUSCULAR | Status: AC
  Filled 2017-12-03: qty 10

## 2017-12-03 MED ORDER — PROPOFOL 10 MG/ML IV BOLUS
INTRAVENOUS | Status: AC
Start: 2017-12-03 — End: ?
  Filled 2017-12-03: qty 20

## 2017-12-03 MED ORDER — ONDANSETRON HCL 4 MG/2ML IJ SOLN
INTRAMUSCULAR | Status: AC
  Filled 2017-12-03: qty 2

## 2017-12-03 MED ORDER — DEXAMETHASONE SODIUM PHOSPHATE 10 MG/ML IJ SOLN
INTRAMUSCULAR | Status: DC | PRN
  Administered 2017-12-03: 4 mg via INTRAVENOUS

## 2017-12-03 MED ORDER — CEFAZOLIN SODIUM-DEXTROSE 2-4 GM/100ML-% IV SOLN
INTRAVENOUS | Status: AC
  Filled 2017-12-03: qty 100

## 2017-12-03 MED ORDER — SUCCINYLCHOLINE CHLORIDE 20 MG/ML IJ SOLN
INTRAMUSCULAR | Status: DC | PRN
  Administered 2017-12-03: 80 mg via INTRAVENOUS

## 2017-12-03 MED ORDER — DEXAMETHASONE SODIUM PHOSPHATE 10 MG/ML IJ SOLN
INTRAMUSCULAR | Status: AC
Start: 2017-12-03 — End: ?
  Filled 2017-12-03: qty 1

## 2017-12-03 MED ORDER — FENTANYL CITRATE (PF) 100 MCG/2ML IJ SOLN: 25.0000 ug | INTRAMUSCULAR | Status: DC | PRN

## 2017-12-03 MED ORDER — PHENYLEPHRINE HCL 10 MG/ML IJ SOLN
INTRAMUSCULAR | Status: DC | PRN
  Administered 2017-12-03: 50 ug via INTRAVENOUS

## 2017-12-03 MED ORDER — ACETAMINOPHEN 325 MG RE SUPP
10.0000 mg/kg | Freq: Once | RECTAL | Status: DC
  Filled 2017-12-03: qty 1

## 2017-12-03 MED ORDER — PHENYLEPHRINE HCL 10 MG/ML IJ SOLN
INTRAMUSCULAR | Status: AC
Start: 2017-12-03 — End: ?
  Filled 2017-12-03: qty 1

## 2017-12-03 SURGICAL SUPPLY — 38 items
BAG DRAIN CYSTO-URO LG1000N (MISCELLANEOUS) ×2 IMPLANT
BASKET ZERO TIP 1.9FR (BASKET) ×1 IMPLANT
BRUSH SCRUB EZ  4% CHG (MISCELLANEOUS)
BRUSH SCRUB EZ 1% IODOPHOR (MISCELLANEOUS) ×2 IMPLANT
BRUSH SCRUB EZ 4% CHG (MISCELLANEOUS) IMPLANT
CATH URETL 5X70 OPEN END (CATHETERS) ×2 IMPLANT
CNTNR SPEC 2.5X3XGRAD LEK (MISCELLANEOUS)
CONRAY 43 FOR UROLOGY 50M (MISCELLANEOUS) ×2 IMPLANT
CONT SPEC 4OZ STER OR WHT (MISCELLANEOUS)
CONTAINER SPEC 2.5X3XGRAD LEK (MISCELLANEOUS) IMPLANT
DRAPE UTILITY 15X26 TOWEL STRL (DRAPES) ×2 IMPLANT
FIBER LASER LITHO 273 (Laser) IMPLANT
GLOVE BIO SURGEON STRL SZ8 (GLOVE) ×2 IMPLANT
GLOVE BIOGEL M 8.0 STRL (GLOVE) ×2 IMPLANT
GLOVE BIOGEL PI IND STRL 8 (GLOVE) ×1 IMPLANT
GLOVE BIOGEL PI INDICATOR 8 (GLOVE) ×1
GOWN STANDARD XL  REUSABL (MISCELLANEOUS) ×2 IMPLANT
GOWN STRL REUS W/ TWL LRG LVL3 (GOWN DISPOSABLE) ×2 IMPLANT
GOWN STRL REUS W/TWL LRG LVL3 (GOWN DISPOSABLE) ×2
GUIDEWIRE GREEN .038 145CM (MISCELLANEOUS) IMPLANT
INFUSOR MANOMETER BAG 3000ML (MISCELLANEOUS) ×2 IMPLANT
INTRODUCER DILATOR DOUBLE (INTRODUCER) IMPLANT
KIT TURNOVER CYSTO (KITS) ×2 IMPLANT
PACK CYSTO AR (MISCELLANEOUS) ×2 IMPLANT
SENSORWIRE 0.038 NOT ANGLED (WIRE) ×2
SET CYSTO W/LG BORE CLAMP LF (SET/KITS/TRAYS/PACK) ×2 IMPLANT
SHEATH URETERAL 12FRX35CM (MISCELLANEOUS) IMPLANT
SHEATH URETERAL 13/15X36 1L (SHEATH) IMPLANT
SHEATH URETL 1L 13/15X28 (SHEATH) IMPLANT
SOL .9 NS 3000ML IRR  AL (IV SOLUTION) ×1
SOL .9 NS 3000ML IRR UROMATIC (IV SOLUTION) ×1 IMPLANT
STENT URET 6FRX22 CONTOUR (STENTS) ×1 IMPLANT
STENT URET 6FRX24 CONTOUR (STENTS) IMPLANT
STENT URET 6FRX26 CONTOUR (STENTS) IMPLANT
SURGILUBE 2OZ TUBE FLIPTOP (MISCELLANEOUS) ×2 IMPLANT
SYRINGE IRR TOOMEY STRL 70CC (SYRINGE) ×2 IMPLANT
WATER STERILE IRR 1000ML POUR (IV SOLUTION) ×2 IMPLANT
WIRE SENSOR 0.038 NOT ANGLED (WIRE) ×2 IMPLANT

## 2017-12-03 NOTE — Interval H&P Note (Signed)
History and Physical Interval Note:  12/03/2017 7:10 AM  Jamie BrookesPatricia D Young  has presented today for surgery, with the diagnosis of left ureteral calculus  The various methods of treatment have been discussed with the patient and family. After consideration of risks, benefits and other options for treatment, the patient has consented to  Procedure(s): CYSTOSCOPY WITH URETEROSCOPY (Left) CYSTOSCOPY WITH STENT REPLACEMENT (Left) CYSTOSCOPY WITH HOLMIUM LASER LITHOTRIPSY (Left) as a surgical intervention .  The patient's history has been reviewed, patient examined, no change in status, stable for surgery.  I have reviewed the patient's chart and labs.  Questions were answered to the patient's satisfaction.     Veronica Guerrant C Radford Pease

## 2017-12-03 NOTE — Anesthesia Procedure Notes (Signed)
Procedure Name: LMA Insertion Performed by: Casey BurkittHoang, Loza Prell, CRNA Pre-anesthesia Checklist: Patient identified, Patient being monitored, Timeout performed, Emergency Drugs available and Suction available Patient Re-evaluated:Patient Re-evaluated prior to induction Oxygen Delivery Method: Circle system utilized Preoxygenation: Pre-oxygenation with 100% oxygen Induction Type: IV induction Ventilation: Mask ventilation without difficulty LMA: LMA inserted LMA Size: 3.0 Tube type: Oral Number of attempts: 2 Placement Confirmation: positive ETCO2 and breath sounds checked- equal and bilateral Tube secured with: Tape Dental Injury: Teeth and Oropharynx as per pre-operative assessment  Comments: Attempted with LMA #3.5, unable to seat. Removed and replaced with LMA #3. +BBS, +ETCO2.

## 2017-12-03 NOTE — Anesthesia Procedure Notes (Signed)
Procedure Name: Intubation Performed by: Lance Muss, CRNA Pre-anesthesia Checklist: Patient identified, Patient being monitored, Timeout performed, Emergency Drugs available and Suction available Patient Re-evaluated:Patient Re-evaluated prior to induction Oxygen Delivery Method: Circle system utilized Preoxygenation: Pre-oxygenation with 100% oxygen Induction Type: IV induction Ventilation: Mask ventilation without difficulty Laryngoscope Size: Mac and 3 Grade View: Grade IV Tube type: Oral Number of attempts: 1 Dental Injury: Teeth and Oropharynx as per pre-operative assessment  Difficulty Due To: Difficult Airway- due to anterior larynx Future Recommendations: Recommend- induction with short-acting agent, and alternative techniques readily available Comments: First attempt grade IV view. MDA request to convert to LMA.

## 2017-12-03 NOTE — Transfer of Care (Signed)
Immediate Anesthesia Transfer of Care Note  Patient: Brandy Young  Procedure(s) Performed: CYSTOSCOPY WITH URETEROSCOPY (Left ) CYSTOSCOPY WITH STENT REPLACEMENT (Left )  Patient Location: PACU  Anesthesia Type:General  Level of Consciousness: awake, alert  and responds to stimulation  Airway & Oxygen Therapy: Patient Spontanous Breathing and Patient connected to face mask oxygen  Post-op Assessment: Report given to RN and Post -op Vital signs reviewed and stable  Post vital signs: Reviewed and stable  Last Vitals:  Vitals Value Taken Time  BP 132/64 12/03/2017  8:15 AM  Temp 36.6 C 12/03/2017  8:15 AM  Pulse 86 12/03/2017  8:15 AM  Resp 13 12/03/2017  8:15 AM  SpO2 100 % 12/03/2017  8:15 AM  Vitals shown include unvalidated device data.  Last Pain:  Vitals:   12/03/17 0613  TempSrc: Tympanic  PainSc: 0-No pain         Complications: No apparent anesthesia complications

## 2017-12-03 NOTE — Telephone Encounter (Signed)
Pt called with many questions about oxybutynin and stent removal. All questions you were answered and pt voiced understanding.

## 2017-12-03 NOTE — Discharge Instructions (Signed)

## 2017-12-03 NOTE — Anesthesia Preprocedure Evaluation (Signed)
Anesthesia Evaluation  Patient identified by MRN, date of birth, ID band Patient awake    Reviewed: Allergy & Precautions, NPO status , Patient's Chart, lab work & pertinent test results  History of Anesthesia Complications (+) history of anesthetic complications  Airway Mallampati: II       Dental   Pulmonary neg pulmonary ROS, neg sleep apnea, neg COPD,           Cardiovascular hypertension, Pt. on medications (-) Past MI and (-) CHF (-) dysrhythmias (-) Valvular Problems/Murmurs     Neuro/Psych neg Seizures  Neuromuscular disease negative psych ROS   GI/Hepatic Neg liver ROS, GERD  Medicated and Controlled,  Endo/Other  neg diabetes  Renal/GU negative Renal ROS     Musculoskeletal  (+) Arthritis ,   Abdominal   Peds  Hematology  (+) anemia ,   Anesthesia Other Findings Past Medical History: No date: Allergic rhinitis due to pollen 10/2017: Complication of anesthesia     Comment:  hypotensive episode after cysto requiring ICU stay No date: DDD (degenerative disc disease), lumbar     Comment:  with spinal stenosis and neurogenic claudication s/p ESI              (Chasnis) No date: DJD (degenerative joint disease) of knee     Comment:  failed steroid and synvisc injections No date: Estrogen deficiency No date: GERD (gastroesophageal reflux disease) No date: Glaucoma No date: History of chicken pox No date: History of kidney stones 2010s: History of stomach ulcers No date: HLD (hyperlipidemia) No date: HTN (hypertension) 2012: IDA (iron deficiency anemia)     Comment:  s/p normal EGD/colonoscopy (iftikhar) 08/2014: Lumbar stenosis with neurogenic claudication     Comment:  by MRI - spinal and foraminal with radiculopathy (Hooten              --> Chasnis --> Jenkins) No date: Neuromuscular disorder (HCC)     Comment:  sometimes fingers get numb No date: Prediabetes 2012: Tricuspid regurgitation  Comment:  by echo, EF 73% Welton Flakes(Khan)  Reproductive/Obstetrics                             Anesthesia Physical  Anesthesia Plan  ASA: III and emergent  Anesthesia Plan: General   Post-op Pain Management:    Induction: Intravenous, Inhalational and Rapid sequence  PONV Risk Score and Plan: 3 and Ondansetron, Dexamethasone and Midazolam  Airway Management Planned: Oral ETT  Additional Equipment:   Intra-op Plan:   Post-operative Plan:   Informed Consent: I have reviewed the patients History and Physical, chart, labs and discussed the procedure including the risks, benefits and alternatives for the proposed anesthesia with the patient or authorized representative who has indicated his/her understanding and acceptance.     Plan Discussed with:   Anesthesia Plan Comments:         Anesthesia Quick Evaluation

## 2017-12-03 NOTE — Telephone Encounter (Signed)
App made ° ° °Brandy Young  °

## 2017-12-03 NOTE — Anesthesia Postprocedure Evaluation (Signed)
Anesthesia Post Note  Patient: Brandy Young  Procedure(s) Performed: CYSTOSCOPY WITH URETEROSCOPY (Left ) CYSTOSCOPY WITH STENT REPLACEMENT (Left )  Patient location during evaluation: PACU Anesthesia Type: General Level of consciousness: awake and alert and oriented Pain management: pain level controlled Vital Signs Assessment: post-procedure vital signs reviewed and stable Respiratory status: spontaneous breathing Cardiovascular status: blood pressure returned to baseline Anesthetic complications: no     Last Vitals:  Vitals:   12/03/17 0902 12/03/17 0910  BP: 110/83 (!) 120/56  Pulse: 76 75  Resp:    Temp:    SpO2: 100% 100%    Last Pain:  Vitals:   12/03/17 0910  TempSrc:   PainSc: 0-No pain                 Chaske Paskett

## 2017-12-03 NOTE — Op Note (Signed)
Preoperative diagnosis: Left distal ureteral calculus  Postoperative diagnosis: Left distal ureteral calculus  Procedure:  1. Cystoscopy 2. Left ureteroscopy and stone removal 3. Left ureteral stent exchange (6 JamaicaFrench) 22 cm 4. Left retrograde pyelography with interpretation  Surgeon: Lorin PicketScott C. Jalia Zuniga, M.D.  Anesthesia: General  Complications: None  Intraoperative findings:  1.  Left retrograde pyelography post procedure showed no filling defects, calculi or contrast extravasation.  There was prompt drainage of contrast from the ureter however some retention of contrast in the renal pelvis.  EBL: Minimal  Specimens: 1. Calculus for analysis   Indication: Brandy Young is a 72 y.o. year old patient with urolithiasis.  She underwent placement of a left ureteral stent on 10/29/2017 for an obstructing 4 mm left distal ureteral calculus with UTI/sepsis.  She presents today for definitive stone treatment.  After reviewing the management options for treatment, the patient elected to proceed with the above surgical procedure(s). We have discussed the potential benefits and risks of the procedure, side effects of the proposed treatment, the likelihood of the patient achieving the goals of the procedure, and any potential problems that might occur during the procedure or recuperation. Informed consent has been obtained.  Description of procedure:  The patient was taken to the operating room and general anesthesia was induced.  The patient was placed in the dorsal lithotomy position, prepped and draped in the usual sterile fashion, and preoperative antibiotics were administered. A preoperative time-out was performed.   A 22 French cystoscope was lubricated and passed under direct vision.  The urethra was normal in appearance.  Panendoscopy was performed and the bladder mucosa showed no erythema, solid or papillary lesions.  Minimal inflammatory changes around the left ureteral orifice.  The  left ureteral stent was grasped with endoscopic forceps and brought out through the urethral meatus.  Attempts at placing a 0.038 Sensor wire through the stent were unsuccessful.  The cystoscope was repassed and a sensor wire was placed alongside the stent and advanced proximally to the renal pelvis under fluoroscopic guidance.  The ureteral stent was removed.  After removing the cystoscope and a 4.5 Fr semirigid ureteroscope was then advanced into the ureter next to the guidewire and the calculus was identified in the distal ureter.  The stone was placed in a 1.9 French 0 tip nitinol basket and removed without difficulty.  The ureteroscope was repassed and the guidewire was removed.  A retrograde pyelogram was performed through the ureteroscope with findings as described above.  It was elected to replace her stent temporarily.  The sensor wire was replaced and a 6 French/22 cm double-J ureteral stent was placed without difficulty.  Appropriate positioning proximally and distally was noted.  The stent was left attached to a tether and will be removed on 12/06/2017.  The bladder was then emptied and the procedure ended.  The patient appeared to tolerate the procedure well and without complications.  After anesthetic reversal the patient was transported to the PACU in stable condition.    Riki AltesScott C Suella Cogar, MD

## 2017-12-03 NOTE — OR Nursing (Signed)
Discussed discharge instructions with patient and son. Both voice understanding.

## 2017-12-03 NOTE — Anesthesia Post-op Follow-up Note (Signed)
Anesthesia QCDR form completed.        

## 2017-12-03 NOTE — Telephone Encounter (Signed)
-----   Message from Riki AltesScott C Stoioff, MD sent at 12/03/2017  8:35 AM EDT ----- Patient has a stent with a string and does not feel comfortable removing.  Needs a nurse visit on Monday for stent removal.

## 2017-12-06 ENCOUNTER — Encounter: Payer: Self-pay | Admitting: Urology

## 2017-12-06 ENCOUNTER — Ambulatory Visit: Payer: Medicare Other

## 2017-12-08 LAB — STONE ANALYSIS
CA HYDROGEN PHOS.: 5 %
CA OXALATE, MONOHYDR.: 45 %
CA PHOS CRY STONE QL IR: 50 %
Stone Weight KSTONE: 21 mg

## 2017-12-10 DIAGNOSIS — M5416 Radiculopathy, lumbar region: Secondary | ICD-10-CM | POA: Diagnosis not present

## 2017-12-10 DIAGNOSIS — M5136 Other intervertebral disc degeneration, lumbar region: Secondary | ICD-10-CM | POA: Diagnosis not present

## 2017-12-10 DIAGNOSIS — M7541 Impingement syndrome of right shoulder: Secondary | ICD-10-CM | POA: Diagnosis not present

## 2017-12-10 DIAGNOSIS — M48062 Spinal stenosis, lumbar region with neurogenic claudication: Secondary | ICD-10-CM | POA: Diagnosis not present

## 2017-12-13 LAB — SUSCEPTIBILITY RESULT

## 2017-12-13 LAB — URINE CULTURE

## 2017-12-13 LAB — SUSCEPTIBILITY, AER + ANAEROB

## 2018-01-06 ENCOUNTER — Ambulatory Visit: Payer: Medicare Other | Admitting: Urology

## 2018-01-07 DIAGNOSIS — H401113 Primary open-angle glaucoma, right eye, severe stage: Secondary | ICD-10-CM | POA: Diagnosis not present

## 2018-01-13 ENCOUNTER — Ambulatory Visit (INDEPENDENT_AMBULATORY_CARE_PROVIDER_SITE_OTHER): Payer: Medicare Other | Admitting: Urology

## 2018-01-13 ENCOUNTER — Encounter: Payer: Self-pay | Admitting: Urology

## 2018-01-13 VITALS — BP 118/69 | HR 83 | Resp 16 | Ht <= 58 in | Wt 119.8 lb

## 2018-01-13 DIAGNOSIS — Z87442 Personal history of urinary calculi: Secondary | ICD-10-CM

## 2018-01-13 DIAGNOSIS — R350 Frequency of micturition: Secondary | ICD-10-CM | POA: Diagnosis not present

## 2018-01-13 DIAGNOSIS — N390 Urinary tract infection, site not specified: Secondary | ICD-10-CM | POA: Diagnosis not present

## 2018-01-13 LAB — URINALYSIS, COMPLETE
Bilirubin, UA: NEGATIVE
Glucose, UA: NEGATIVE
KETONES UA: NEGATIVE
LEUKOCYTES UA: NEGATIVE
Nitrite, UA: NEGATIVE
PH UA: 6 (ref 5.0–7.5)
Protein, UA: NEGATIVE
RBC, UA: NEGATIVE
Specific Gravity, UA: 1.015 (ref 1.005–1.030)
Urobilinogen, Ur: 0.2 mg/dL (ref 0.2–1.0)

## 2018-01-13 NOTE — Progress Notes (Signed)
01/13/2018 1:36 PM   Brandy Young Jul 25, 1946 [id number]  Referring provider: Eustaquio Boyden, MD [address] [place], [place] [address]  Chief Complaint  Patient presents with  . Routine Post Op    HPI: 72 year old female status post left ureteroscopic stone removal on 12/03/2017.  She removed her stent on postoperative day #3.  She does complain of urinary frequency, urgency with occasional episodes of urge incontinence.  This appears to be worse since her stent removal although she has had intermittent irritative symptoms in the past.  She is a first-time stone former. Stone analysis was 45% calcium oxalate monohydrate/50 percent calcium phosphate/5% calcium hydrogen phosphate.  PMH: Past Medical History:  Diagnosis Date  . Allergic rhinitis due to pollen   . Complication of anesthesia 10/2017   hypotensive episode after cysto requiring ICU stay  . DDD (degenerative disc disease), lumbar    with spinal stenosis and neurogenic claudication s/p ESI (Chasnis)  . DJD (degenerative joint disease) of knee    failed steroid and synvisc injections  . Estrogen deficiency   . GERD (gastroesophageal reflux disease)   . Glaucoma   . History of chicken pox   . History of kidney stones   . History of stomach ulcers 2010s  . HLD (hyperlipidemia)   . HTN (hypertension)   . IDA (iron deficiency anemia) 2012   s/p normal EGD/colonoscopy (iftikhar)  . 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 Neuromuscular disorder (HCC)    sometimes fingers get numb  . Prediabetes   . Tricuspid regurgitation 2012   by echo, EF 73% Welton Flakes)    Surgical History: Past Surgical History:  Procedure Laterality Date  . APPENDECTOMY    . COLONOSCOPY  08/2011   diverticulosis, int hem (Iftikhar)  . CYSTOSCOPY W/ RETROGRADES Left 10/29/2017   Procedure: CYSTOSCOPY WITH RETROGRADE PYELOGRAM;   Surgeon: Riki Altes, MD;  Location: [facility] ORS;  Service: Urology;  Laterality: Left;  . CYSTOSCOPY W/ URETERAL STENT PLACEMENT Left 12/03/2017   Procedure: CYSTOSCOPY WITH STENT REPLACEMENT;  Surgeon: Riki Altes, MD;  Location: [facility] OR[facility];  Service: Urology;  Laterality: Left;  . CYSTOSCOPY WITH STENT PLACEMENT Left 10/29/2017   Procedure: CYSTOSCOPY WITH STENT PLACEMENT;  Surgeon: Riki Altes, MD;  Location: [facility] OR[facility];  Service: Urology;  Laterality: Left;  . CYSTOSCOPY WITH URETEROSCOPY Left 12/03/2017   Procedure: CYSTOSCOPY WITH URETEROSCOPY;  Surgeon: Riki Altes, MD;  Location: [facility] OR[facility];  Service: Urology;  Laterality: Left;  . ESI  multiple, latest 03/2015   R L5/S1, L L3/4, L L4/5 L5/S1, L L4/5 and L5/S1 - minimal relief (Chasnis)  . ESOPHAGOGASTRODUODENOSCOPY  08/2011   WNL  . EYE SURGERY Bilateral    laser eye surgery  . FOOT SURGERY Right   . TOE SURGERY Right 2005   titanium for toe replacement  . VAGINAL HYSTERECTOMY  1980s   prolapsed uterus, ovaries remained    Home Medications:  Allergies as of 01/13/2018      Reactions   Neurontin [gabapentin] Photosensitivity, Rash, Other (See Comments)   Blurry vision, off balance   Niacin And Related Hives      Medication List        Accurate as of 01/13/18  1:36 PM. Always use your most recent med list.          calcium carbonate 600 MG Tabs tablet Commonly known as:  OS-CAL  Take 600 mg by mouth 2 (two) times daily with a meal.   cetirizine 10 MG tablet Commonly known as:  ZYRTEC Take 10 mg by mouth daily.   ferrous sulfate 325 (65 FE) MG tablet Take 1 tablet (325 mg total) by mouth daily with breakfast.   Fish Oil 1200 MG Caps Take 1 capsule by mouth 2 (two) times daily.   HYDROcodone-acetaminophen 7.5-325 MG tablet Commonly known as:  NORCO Take 1 tablet by mouth 3 (three) times daily as needed for moderate pain. (Dr Yves Dill)   ibuprofen 200 MG tablet Commonly known as:  ADVIL,MOTRIN Take 200 mg  by mouth every 6 (six) hours as needed.   lisinopril 10 MG tablet Commonly known as:  PRINIVIL,ZESTRIL Take 1 tablet (10 mg total) by mouth daily.   Magnesium 250 MG Tabs Take 1 tablet by mouth 2 (two) times daily.   omeprazole 40 MG capsule Commonly known as:  PRILOSEC Take 1 capsule (40 mg total) by mouth daily.   oxybutynin 5 MG tablet Commonly known as:  DITROPAN Take 1 tablet (5 mg total) by mouth 3 (three) times daily.   psyllium 58.6 % powder Commonly known as:  METAMUCIL Take 1 packet by mouth daily.   senna-docusate 8.6-50 MG tablet Commonly known as:  Senokot-S Take 1 tablet by mouth at bedtime as needed for mild constipation.   simvastatin 10 MG tablet Commonly known as:  ZOCOR Take 1 tablet (10 mg total) by mouth daily.   vitamin B-12 500 MCG tablet Commonly known as:  CYANOCOBALAMIN Take 1 tablet (500 mcg total) by mouth daily.       Allergies:  Allergies  Allergen Reactions  . Neurontin [Gabapentin] Photosensitivity, Rash and Other (See Comments)    Blurry vision, off balance   . Niacin And Related Hives    Family History: Family History  Problem Relation Age of Onset  . Stroke Father 48       hemorrhagic bleed  . Hypertension Father   . Cancer Mother 50       breast  . Breast cancer Mother 17  . Cancer Brother 79       lung (nonsmoker)  . Breast cancer Paternal Grandmother   . CAD Neg Hx   . Diabetes Neg Hx     Social History:  reports that she has never smoked. She has never used smokeless tobacco. She reports that she does not drink alcohol or use drugs.  ROS: UROLOGY Frequent Urination?: Yes Hard to postpone urination?: No Burning/pain with urination?: Yes Get up at night to urinate?: Yes Leakage of urine?: Yes Urine stream starts and stops?: No Trouble starting stream?: No Do you have to strain to urinate?: No Blood in urine?: No Urinary tract infection?: No Sexually transmitted disease?: No Injury to kidneys or bladder?:  No Painful intercourse?: No Weak stream?: No Currently pregnant?: No Vaginal bleeding?: No Last menstrual period?: n  Gastrointestinal Nausea?: No Vomiting?: No Indigestion/heartburn?: No Diarrhea?: No Constipation?: No  Constitutional Fever: No Night sweats?: No Weight loss?: No Fatigue?: No  Skin Skin rash/lesions?: No Itching?: No  Eyes Blurred vision?: No Double vision?: No  Ears/Nose/Throat Sore throat?: No Sinus problems?: No  Hematologic/Lymphatic Swollen glands?: No Easy bruising?: No  Cardiovascular Leg swelling?: No Chest pain?: No  Respiratory Cough?: No Shortness of breath?: No  Endocrine Excessive thirst?: No  Musculoskeletal Back pain?: No Joint pain?: No  Neurological Headaches?: No Dizziness?: No  Psychologic Depression?: No Anxiety?: No  Physical Exam: BP 118/69  Pulse 83   Resp 16   Ht  (1.473 m)   Wt 119 lb 12.8 oz (54.3 kg)   SpO2 96%   BMI 25.04 kg/m   Constitutional:  Alert and oriented, No acute distress. HEENT: Bow[place] AT, moist mucus membranes.  Trachea midline, no masses. Cardiovascular: No clubbing, cyanosis, or edema. Respiratory: Normal respiratory effort, no increased work of breathing. GI: Abdomen is soft, nontender, nondistended, no abdominal masses GU: No CVA tenderness Lymph: No cervical or inguinal lymphadenopathy. Skin: No rashes, bruises or suspicious lesions. Neurologic: Grossly intact, no focal deficits, moving all 4 extremities. Psychiatric: Normal mood and affect.  Laboratory Data: Lab Results  Component Value Date   WBC 9.1 11/26/2017   HGB 11.4 (L) 11/26/2017   HCT 35.1 11/26/2017   MCV 92.6 11/26/2017   PLT 377 11/26/2017    Lab Results  Component Value Date   CREATININE 0.73 11/26/2017    Lab Results  Component Value Date   HGBA1C 6.1 10/04/2017    Urinalysis: Negative dipstick/microscopy  Assessment & Plan:   Doing well status post ureteroscopic stone removal.   Urinalysis was unremarkable.  She is a first-time stone former.  We discussed general dietary and fluid intake guidelines versus pursuing a metabolic evaluation.  She does not desire a metabolic evaluation at this time.    She does have overactive bladder symptoms and states oxybutynin does not have good efficacy.  Given samples of Myrbetriq 50 mg daily to try and will call back if this is effective.  Follow-up 6 months with a KUB.   Riki Altes, MD  [facility] [address], [address] [place], [place] [address] [phone]

## 2018-01-25 ENCOUNTER — Telehealth: Payer: Self-pay | Admitting: Urology

## 2018-01-25 NOTE — Telephone Encounter (Signed)
Pt LMOM stating she spoke to MA at her last visit (chris) discussing Mybetriq, she states she was told to call when she found out what Medicare would pay. Pt would like someone to call her today @ [phone]. Thanks

## 2018-01-27 ENCOUNTER — Telehealth: Payer: Self-pay | Admitting: Urology

## 2018-01-27 MED ORDER — MIRABEGRON ER 50 MG PO TB24: 50.0000 mg | ORAL_TABLET | Freq: Every day | ORAL | 11 refills | Status: DC

## 2018-01-27 NOTE — Addendum Note (Signed)
Addended by: Honor Loh on: 01/27/2018 03:39 PM   Modules accepted: Orders

## 2018-01-27 NOTE — Telephone Encounter (Signed)
Pt called office following up on phone call made a couple days ago. Pt states medicare is going to cover her Mybetriq and would like Rx, pt fears running out of meds. Please adivse. Thanks.

## 2018-01-27 NOTE — Telephone Encounter (Signed)
Patient got confirmation from her insurance company they will pay for Myrbetriq with a co pay that is reasonable for her. The medication has been sent to her pharmacy.

## 2018-03-07 DIAGNOSIS — M5136 Other intervertebral disc degeneration, lumbar region: Secondary | ICD-10-CM | POA: Diagnosis not present

## 2018-03-07 DIAGNOSIS — M7541 Impingement syndrome of right shoulder: Secondary | ICD-10-CM | POA: Diagnosis not present

## 2018-03-07 DIAGNOSIS — M5416 Radiculopathy, lumbar region: Secondary | ICD-10-CM | POA: Diagnosis not present

## 2018-03-07 DIAGNOSIS — M48062 Spinal stenosis, lumbar region with neurogenic claudication: Secondary | ICD-10-CM | POA: Diagnosis not present

## 2018-03-07 DIAGNOSIS — H401114 Primary open-angle glaucoma, right eye, indeterminate stage: Secondary | ICD-10-CM | POA: Diagnosis not present

## 2018-04-27 DIAGNOSIS — H401113 Primary open-angle glaucoma, right eye, severe stage: Secondary | ICD-10-CM | POA: Diagnosis not present

## 2018-04-30 DIAGNOSIS — H401113 Primary open-angle glaucoma, right eye, severe stage: Secondary | ICD-10-CM | POA: Insufficient documentation

## 2018-05-25 DIAGNOSIS — H401113 Primary open-angle glaucoma, right eye, severe stage: Secondary | ICD-10-CM | POA: Diagnosis not present

## 2018-05-29 ENCOUNTER — Other Ambulatory Visit: Payer: Self-pay | Admitting: Family Medicine

## 2018-05-29 DIAGNOSIS — R928 Other abnormal and inconclusive findings on diagnostic imaging of breast: Secondary | ICD-10-CM

## 2018-06-10 DIAGNOSIS — M5416 Radiculopathy, lumbar region: Secondary | ICD-10-CM | POA: Diagnosis not present

## 2018-06-10 DIAGNOSIS — M48062 Spinal stenosis, lumbar region with neurogenic claudication: Secondary | ICD-10-CM | POA: Diagnosis not present

## 2018-06-10 DIAGNOSIS — M7541 Impingement syndrome of right shoulder: Secondary | ICD-10-CM | POA: Diagnosis not present

## 2018-06-10 DIAGNOSIS — M5136 Other intervertebral disc degeneration, lumbar region: Secondary | ICD-10-CM | POA: Diagnosis not present

## 2018-06-14 ENCOUNTER — Ambulatory Visit
Admission: RE | Admit: 2018-06-14 | Discharge: 2018-06-14 | Disposition: A | Discharge status: Home / Self Care | Payer: Medicare Other | Source: Ambulatory Visit | Attending: Family Medicine | Admitting: Family Medicine

## 2018-06-14 DIAGNOSIS — R928 Other abnormal and inconclusive findings on diagnostic imaging of breast: Secondary | ICD-10-CM | POA: Insufficient documentation

## 2018-06-14 DIAGNOSIS — R922 Inconclusive mammogram: Secondary | ICD-10-CM | POA: Diagnosis not present

## 2018-06-14 LAB — HM MAMMOGRAPHY

## 2018-06-15 ENCOUNTER — Encounter: Payer: Self-pay | Admitting: Family Medicine

## 2018-06-21 ENCOUNTER — Other Ambulatory Visit: Payer: Self-pay | Admitting: Family Medicine

## 2018-07-17 ENCOUNTER — Other Ambulatory Visit: Payer: Self-pay | Admitting: Internal Medicine

## 2018-07-18 ENCOUNTER — Ambulatory Visit: Payer: Medicare Other | Admitting: Urology

## 2018-07-18 ENCOUNTER — Ambulatory Visit
Admission: RE | Admit: 2018-07-18 | Discharge: 2018-07-18 | Disposition: A | Discharge status: Home / Self Care | Payer: Medicare Other | Source: Ambulatory Visit | Attending: Urology | Admitting: Urology

## 2018-07-18 ENCOUNTER — Encounter: Payer: Self-pay | Admitting: Urology

## 2018-07-18 VITALS — BP 113/71 | HR 90 | Ht <= 58 in | Wt 118.0 lb

## 2018-07-18 DIAGNOSIS — M47816 Spondylosis without myelopathy or radiculopathy, lumbar region: Secondary | ICD-10-CM | POA: Diagnosis not present

## 2018-07-18 DIAGNOSIS — I700 Atherosclerosis of aorta: Secondary | ICD-10-CM | POA: Diagnosis not present

## 2018-07-18 DIAGNOSIS — N3281 Overactive bladder: Secondary | ICD-10-CM

## 2018-07-18 DIAGNOSIS — Z87442 Personal history of urinary calculi: Secondary | ICD-10-CM

## 2018-07-18 DIAGNOSIS — N200 Calculus of kidney: Secondary | ICD-10-CM | POA: Diagnosis not present

## 2018-07-18 DIAGNOSIS — M4186 Other forms of scoliosis, lumbar region: Secondary | ICD-10-CM | POA: Diagnosis not present

## 2018-07-18 NOTE — Progress Notes (Signed)
07/18/2018 3:42 PM   Brandy Young 1945/09/06 [id number]  Referring provider: Eustaquio Boyden, MD [address] [place], [place] [address]  Chief Complaint  Patient presents with  . Nephrolithiasis    HPI: 72 year old female presents for 79-month follow-up of stone disease and overactive bladder.  She will underwent ureteroscopic removal 4 mm left distal ureteral calculus in April 2019 after initially presenting with UTI/sepsis with urgent stent placement.  She has done well the past 6 months and denies recurrent UTIs, flank or abdominal pain.  She also complained of urinary frequency, urgency with intermittent episodes of urge incontinence.  She was started on Myrbetriq 50 mg with significant improvement in her symptoms and has continued this medication.  She has no complaints today.  A KUB was performed prior to the visit.   PMH: Past Medical History:  Diagnosis Date  . Allergic rhinitis due to pollen   . Complication of anesthesia 10/2017   hypotensive episode after cysto requiring ICU stay  . DDD (degenerative disc disease), lumbar    with spinal stenosis and neurogenic claudication s/p ESI (Chasnis)  . DJD (degenerative joint disease) of knee    failed steroid and synvisc injections  . Estrogen deficiency   . GERD (gastroesophageal reflux disease)   . Glaucoma   . History of chicken pox   . History of kidney stones   . History of stomach ulcers 2010s  . HLD (hyperlipidemia)   . HTN (hypertension)   . IDA (iron deficiency anemia) 2012   s/p normal EGD/colonoscopy (iftikhar)  . 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 Neuromuscular disorder (HCC)    sometimes fingers get numb  . Prediabetes   . Tricuspid regurgitation 2012   by echo, EF 73% [place] Flakes)    Surgical History: Past Surgical History:  Procedure Laterality Date  . APPENDECTOMY    . COLONOSCOPY   08/2011   diverticulosis, int hem (Iftikhar)  . CYSTOSCOPY W/ RETROGRADES Left 10/29/2017   Procedure: CYSTOSCOPY WITH RETROGRADE PYELOGRAM;  Surgeon: Riki Altes, MD;  Location: [facility];  Service: Urology;  Laterality: Left;  . CYSTOSCOPY W/ URETERAL STENT PLACEMENT Left 12/03/2017   Procedure: CYSTOSCOPY WITH STENT REPLACEMENT;  Surgeon: Riki Altes, MD;  Location: [facility];  Service: Urology;  Laterality: Left;  . CYSTOSCOPY WITH STENT PLACEMENT Left 10/29/2017   Procedure: CYSTOSCOPY WITH STENT PLACEMENT;  Surgeon: Riki Altes, MD;  Location: [facility];  Service: Urology;  Laterality: Left;  . CYSTOSCOPY WITH URETEROSCOPY Left 12/03/2017   Procedure: CYSTOSCOPY WITH URETEROSCOPY;  Surgeon: Riki Altes, MD;  Location: [facility];  Service: Urology;  Laterality: Left;  . ESI  multiple, latest 03/2015   R L5/S1, L L3/4, L L4/5 L5/S1, L L4/5 and L5/S1 - minimal relief (Chasnis)  . ESOPHAGOGASTRODUODENOSCOPY  08/2011   WNL  . EYE SURGERY Bilateral    laser eye surgery  . FOOT SURGERY Right   . TOE SURGERY Right 2005   titanium for toe replacement  . VAGINAL HYSTERECTOMY  1980s   prolapsed uterus, ovaries remained    Home Medications:  Allergies as of 07/18/2018      Reactions   Neurontin [gabapentin] Photosensitivity, Rash, Other (See Comments)   Blurry vision, off balance   Niacin And Related Hives      Medication List        Accurate as of 07/18/18  3:42 PM.  Always use your most recent med list.          calcium carbonate 600 MG Tabs tablet Commonly known as:  OS-CAL Take 600 mg by mouth 2 (two) times daily with a meal.   cetirizine 10 MG tablet Commonly known as:  ZYRTEC Take 10 mg by mouth daily.   ferrous sulfate 325 (65 FE) MG tablet Take 1 tablet (325 mg total) by mouth daily with breakfast.   Fish Oil 1200 MG Caps Take 1 capsule by mouth 2 (two) times daily.   HYDROcodone-acetaminophen 7.5-325 MG tablet Commonly known as:  NORCO Take 1 tablet by  mouth 3 (three) times daily as needed for moderate pain. (Dr Yves Dillhasnis)   ibuprofen 200 MG tablet Commonly known as:  ADVIL,MOTRIN Take 200 mg by mouth every 6 (six) hours as needed.   lisinopril 10 MG tablet Commonly known as:  PRINIVIL,ZESTRIL TAKE 1 TABLET BY MOUTH  DAILY   mirabegron ER 50 MG Tb24 tablet Commonly known as:  MYRBETRIQ Take 1 tablet (50 mg total) by mouth daily.   PREVNAR 13 Susp injection Generic drug:  pneumococcal 13-valent conjugate vaccine   psyllium 58.6 % powder Commonly known as:  METAMUCIL Take 1 packet by mouth daily.   senna-docusate 8.6-50 MG tablet Commonly known as:  Senokot-S Take 1 tablet by mouth at bedtime as needed for mild constipation.   simvastatin 10 MG tablet Commonly known as:  ZOCOR TAKE 1 TABLET BY MOUTH  DAILY   UNABLE TO FIND Med Name: CBD Oil 750mg  1 QD   vitamin B-12 500 MCG tablet Commonly known as:  CYANOCOBALAMIN Take 1 tablet (500 mcg total) by mouth daily.       Allergies:  Allergies  Allergen Reactions  . Neurontin [Gabapentin] Photosensitivity, Rash and Other (See Comments)    Blurry vision, off balance   . Niacin And Related Hives    Family History: Family History  Problem Relation Age of Onset  . Stroke Father 48       hemorrhagic bleed  . Hypertension Father   . Cancer Mother [id number]       breast  . Breast cancer Mother [id number]  . Cancer Brother [id number]       lung (nonsmoker)  . Breast cancer Paternal Grandmother   . CAD Neg Hx   . Diabetes Neg Hx     Social History:  reports that she has never smoked. She has never used smokeless tobacco. She reports that she does not drink alcohol or use drugs.  ROS: UROLOGY Frequent Urination?: No Hard to postpone urination?: No Burning/pain with urination?: No Get up at night to urinate?: No Leakage of urine?: No Urine stream starts and stops?: No Trouble starting stream?: No Do you have to strain to urinate?: No Blood in urine?: No Urinary tract infection?:  No Sexually transmitted disease?: No Injury to kidneys or bladder?: No Painful intercourse?: No Weak stream?: No Currently pregnant?: No Vaginal bleeding?: No Last menstrual period?: Hysterectomy  Gastrointestinal Nausea?: No Vomiting?: No Indigestion/heartburn?: No Diarrhea?: No Constipation?: No  Constitutional Fever: No Night sweats?: No Weight loss?: No Fatigue?: No  Skin Skin rash/lesions?: No Itching?: No  Eyes Blurred vision?: No Double vision?: No  Ears/Nose/Throat Sore throat?: No Sinus problems?: No  Hematologic/Lymphatic Swollen glands?: No Easy bruising?: No  Cardiovascular Leg swelling?: No Chest pain?: No  Respiratory Cough?: No Shortness of breath?: No  Endocrine Excessive thirst?: No  Musculoskeletal Back pain?: No Joint pain?: No  Neurological Headaches?: No Dizziness?:  No  Psychologic Depression?: No Anxiety?: No  Physical Exam: BP 113/71 (BP Location: Left Arm, Patient Position: Sitting, Cuff Size: Normal)   Pulse 90   Ht 4\' 10"  (1.473 m)   Wt 118 lb (53.5 kg)   BMI 24.66 kg/m   Constitutional:  Alert and oriented, No acute distress. HEENT: [place] AT, moist mucus membranes.  Trachea midline, no masses. Cardiovascular: No clubbing, cyanosis, or edema. Respiratory: Normal respiratory effort, no increased work of breathing. GI: Abdomen is soft, nontender, nondistended, no abdominal masses GU: No CVA tenderness Lymph: No cervical or inguinal lymphadenopathy. Skin: No rashes, bruises or suspicious lesions. Neurologic: Grossly intact, no focal deficits, moving all 4 extremities. Psychiatric: Normal mood and affect.   Assessment & Plan:   72 year old female with a history of stone disease currently asymptomatic and no evidence of recurrent calculi.  Stable overactive bladder symptoms on Myrbetriq.  She is scheduled to move to the [facility] area sometime next year however is not sure when.  We will tentatively schedule  her a one-year follow-up however she will cancel the appointment if she moves earlier.   Return in about 1 year (around 07/19/2019) for Recheck, KUB.   Riki Altes, MD  [facility] [address] [place], [place] [address] [phone]

## 2018-07-19 ENCOUNTER — Encounter: Payer: Self-pay | Admitting: Urology

## 2018-09-09 DIAGNOSIS — M5416 Radiculopathy, lumbar region: Secondary | ICD-10-CM | POA: Diagnosis not present

## 2018-09-09 DIAGNOSIS — M48062 Spinal stenosis, lumbar region with neurogenic claudication: Secondary | ICD-10-CM | POA: Diagnosis not present

## 2018-09-09 DIAGNOSIS — M7541 Impingement syndrome of right shoulder: Secondary | ICD-10-CM | POA: Diagnosis not present

## 2018-09-09 DIAGNOSIS — M5136 Other intervertebral disc degeneration, lumbar region: Secondary | ICD-10-CM | POA: Diagnosis not present

## 2018-09-12 DIAGNOSIS — H401113 Primary open-angle glaucoma, right eye, severe stage: Secondary | ICD-10-CM | POA: Diagnosis not present

## 2018-09-17 ENCOUNTER — Other Ambulatory Visit: Payer: Self-pay | Admitting: Family Medicine

## 2018-10-10 ENCOUNTER — Ambulatory Visit: Payer: Medicare Other

## 2018-10-10 ENCOUNTER — Ambulatory Visit (INDEPENDENT_AMBULATORY_CARE_PROVIDER_SITE_OTHER): Payer: Medicare Other

## 2018-10-10 VITALS — BP 118/80 | HR 57 | Temp 98.6°F

## 2018-10-10 DIAGNOSIS — R7303 Prediabetes: Secondary | ICD-10-CM

## 2018-10-10 DIAGNOSIS — D509 Iron deficiency anemia, unspecified: Secondary | ICD-10-CM

## 2018-10-10 DIAGNOSIS — Z0000 Encounter for general adult medical examination without abnormal findings: Secondary | ICD-10-CM

## 2018-10-10 DIAGNOSIS — E785 Hyperlipidemia, unspecified: Secondary | ICD-10-CM | POA: Diagnosis not present

## 2018-10-10 DIAGNOSIS — I10 Essential (primary) hypertension: Secondary | ICD-10-CM | POA: Diagnosis not present

## 2018-10-10 DIAGNOSIS — M8580 Other specified disorders of bone density and structure, unspecified site: Secondary | ICD-10-CM

## 2018-10-10 LAB — COMPREHENSIVE METABOLIC PANEL
ALK PHOS: 65 U/L (ref 39–117)
ALT: 15 U/L (ref 0–35)
AST: 24 U/L (ref 0–37)
Albumin: 4.1 g/dL (ref 3.5–5.2)
BILIRUBIN TOTAL: 0.6 mg/dL (ref 0.2–1.2)
BUN: 19 mg/dL (ref 6–23)
CO2: 24 meq/L (ref 19–32)
Calcium: 9.4 mg/dL (ref 8.4–10.5)
Chloride: 109 mEq/L (ref 96–112)
Creatinine, Ser: 0.64 mg/dL (ref 0.40–1.20)
GFR: 90.47 mL/min (ref >60.00)
Glucose, Bld: 82 mg/dL (ref 70–99)
Potassium: 4.1 mEq/L (ref 3.5–5.1)
SODIUM: 142 meq/L (ref 135–145)
Total Protein: 6.2 g/dL (ref 6.0–8.3)

## 2018-10-10 LAB — HEMOGLOBIN A1C: Hgb A1c MFr Bld: 5.9 % (ref 4.6–6.5)

## 2018-10-10 LAB — CBC WITH DIFFERENTIAL/PLATELET
BASOS PCT: 0.6 % (ref 0.0–3.0)
Basophils Absolute: 0 10*3/uL (ref 0.0–0.1)
EOS PCT: 3.9 % (ref 0.0–5.0)
Eosinophils Absolute: 0.2 10*3/uL (ref 0.0–0.7)
HCT: 33.7 % — ABNORMAL LOW (ref 36.0–46.0)
Hemoglobin: 11.3 g/dL — ABNORMAL LOW (ref 12.0–15.0)
LYMPHS ABS: 1.9 10*3/uL (ref 0.7–4.0)
Lymphocytes Relative: 33 % (ref 12.0–46.0)
MCHC: 33.7 g/dL (ref 30.0–36.0)
MCV: 94 fl (ref 78.0–100.0)
MONO ABS: 0.4 10*3/uL (ref 0.1–1.0)
Monocytes Relative: 7.4 % (ref 3.0–12.0)
NEUTROS ABS: 3.1 10*3/uL (ref 1.4–7.7)
Neutrophils Relative %: 55.1 % (ref 43.0–77.0)
PLATELETS: 315 10*3/uL (ref 150.0–400.0)
RBC: 3.58 Mil/uL — ABNORMAL LOW (ref 3.87–5.11)
RDW: 13.6 % (ref 11.5–15.5)
WBC: 5.6 10*3/uL (ref 4.0–10.5)

## 2018-10-10 LAB — LIPID PANEL
CHOL/HDL RATIO: 3
Cholesterol: 237 mg/dL — ABNORMAL HIGH (ref 0–200)
HDL: 92.2 mg/dL (ref >39.00)
LDL Cholesterol: 129 mg/dL — ABNORMAL HIGH (ref 0–99)
NONHDL: 144.31
Triglycerides: 75 mg/dL (ref 0.0–149.0)
VLDL: 15 mg/dL (ref 0.0–40.0)

## 2018-10-10 LAB — IBC PANEL
IRON: 112 ug/dL (ref 42–145)
Saturation Ratios: 30 % (ref 20.0–50.0)
TRANSFERRIN: 267 mg/dL (ref 212.0–360.0)

## 2018-10-10 LAB — TSH: TSH: 0.88 u[IU]/mL (ref 0.35–4.50)

## 2018-10-10 LAB — VITAMIN D 25 HYDROXY (VIT D DEFICIENCY, FRACTURES): VITD: 32.15 ng/mL (ref 30.00–100.00)

## 2018-10-10 LAB — FERRITIN: Ferritin: 27.7 ng/mL (ref 10.0–291.0)

## 2018-10-10 NOTE — Patient Instructions (Signed)
Brandy Young , Thank you for taking time to come for your Medicare Wellness Visit. I appreciate your ongoing commitment to your health goals. Please review the following plan we discussed and let me know if I can assist you in the future.   These are the goals we discussed: Goals    . Patient Stated     Starting 10/10/2018, I will continue to take medications as prescribed.        This is a list of the screening recommended for you and due dates:  Health Maintenance  Topic Date Due  . Mammogram  06/15/2019  . Colon Cancer Screening  08/09/2021  . DTaP/Tdap/Td vaccine (2 - Td) 04/18/2023  . Tetanus Vaccine  04/18/2023  . Flu Shot  Completed  . DEXA scan (bone density measurement)  Completed  .  Hepatitis C: One time screening is recommended by Center for Disease Control  (CDC) for  adults born from [birth year] through 1967.   Completed  . Pneumonia vaccines  Completed   Preventive Care for Adults  A healthy lifestyle and preventive care can promote health and wellness. Preventive health guidelines for adults include the following key practices.  . A routine yearly physical is a good way to check with your health care provider about your health and preventive screening. It is a chance to share any concerns and updates on your health and to receive a thorough exam.  . Visit your dentist for a routine exam and preventive care every 6 months. Brush your teeth twice a day and floss once a day. Good oral hygiene prevents tooth decay and gum disease.  . The frequency of eye exams is based on your age, health, family medical history, use  of contact lenses, and other factors. Follow your health care provider's recommendations for frequency of eye exams.  . Eat a healthy diet. Foods like vegetables, fruits, whole grains, low-fat dairy products, and lean protein foods contain the nutrients you need without too many calories. Decrease your intake of foods high in solid fats, added sugars, and salt. Eat  the right amount of calories for you. Get information about a proper diet from your health care provider, if necessary.  . Regular physical exercise is one of the most important things you can do for your health. Most adults should get at least 150 minutes of moderate-intensity exercise (any activity that increases your heart rate and causes you to sweat) each week. In addition, most adults need muscle-strengthening exercises on 2 or more days a week.  Silver Sneakers may be a benefit available to you. To determine eligibility, you may visit the website: www.silversneakers.com or contact program at 608-578-2662 Mon-Fri between 8AM-8PM.   . Maintain a healthy weight. The body mass index (BMI) is a screening tool to identify possible weight problems. It provides an estimate of body fat based on height and weight. Your health care provider can find your BMI and can help you achieve or maintain a healthy weight.   For adults 20 years and older: ? A BMI below 18.5 is considered underweight. ? A BMI of 18.5 to 24.9 is normal. ? A BMI of 25 to 29.9 is considered overweight. ? A BMI of 30 and above is considered obese.   . Maintain normal blood lipids and cholesterol levels by exercising and minimizing your intake of saturated fat. Eat a balanced diet with plenty of fruit and vegetables. Blood tests for lipids and cholesterol should begin at age 70 and be repeated  every 5 years. If your lipid or cholesterol levels are high, you are over 50, or you are at high risk for heart disease, you may need your cholesterol levels checked more frequently. Ongoing high lipid and cholesterol levels should be treated with medicines if diet and exercise are not working.  . If you smoke, find out from your health care provider how to quit. If you do not use tobacco, please do not start.  . If you choose to drink alcohol, please do not consume more than 2 drinks per day. One drink is considered to be 12 ounces (355 mL)  of beer, 5 ounces (148 mL) of wine, or 1.5 ounces (44 mL) of liquor.  . If you are 14-91 years old, ask your health care provider if you should take aspirin to prevent strokes.  . Use sunscreen. Apply sunscreen liberally and repeatedly throughout the day. You should seek shade when your shadow is shorter than you. Protect yourself by wearing long sleeves, pants, a wide-brimmed hat, and sunglasses year round, whenever you are outdoors.  . Once a month, do a whole body skin exam, using a mirror to look at the skin on your back. Tell your health care provider of new moles, moles that have irregular borders, moles that are larger than a pencil eraser, or moles that have changed in shape or color.

## 2018-10-10 NOTE — Progress Notes (Signed)
PCP notes:   Health maintenance:  No gaps identified.   Abnormal screenings:   None  Patient concerns:   None  Nurse concerns:  None  Next PCP appt:   10/31/18 @ 1415

## 2018-10-10 NOTE — Progress Notes (Signed)
Subjective:   Brandy Young is a 73 y.o. female who presents for Medicare Annual (Subsequent) preventive examination.  Review of Systems:  N/A Cardiac Risk Factors include: advanced age (>10men, >62 women);dyslipidemia;hypertension     Objective:     Vitals: BP 118/80 (BP Location: Right Arm, Patient Position: Sitting, Cuff Size: Normal)   Pulse (!) 57   Temp 98.6 F (37 C) (Oral)   SpO2 99%   There is no height or weight on file to calculate BMI.  Advanced Directives 10/10/2018 11/26/2017 10/29/2017 10/29/2017 10/04/2017  Does Patient Have a Medical Advance Directive? Yes No No No No  Type of Estate agent of [place];Living will - - - -  Copy of Healthcare Power of Attorney in Chart? No - copy requested - - - -  Would patient like information on creating a medical advance directive? - No - Patient declined No - Patient declined - No - Patient declined;Yes (MAU/Ambulatory/Procedural Areas - Information given)    Tobacco Social History   Tobacco Use  Smoking Status Never Smoker  Smokeless Tobacco Never Used     Counseling given: No   Clinical Intake:  Pre-visit preparation completed: Yes  Pain : No/denies pain Pain Score: 0-No pain     Nutritional Status: BMI 25 -29 Overweight Nutritional Risks: None  How often do you need to have someone help you when you read instructions, pamphlets, or other written materials from your doctor or pharmacy?: 1 - Never What is the last grade level you completed in school?: 12th grade  Interpreter Needed?: No  Comments: pt is a widow and lives alone Information entered by :: LPinson, LPN  Past Medical History:  Diagnosis Date  . Allergic rhinitis due to pollen   . Cataract   . Complication of anesthesia 10/2017   hypotensive episode after cysto requiring ICU stay  . DDD (degenerative disc disease), lumbar    with spinal stenosis and neurogenic claudication s/p ESI (Chasnis)  . DJD (degenerative joint  disease) of knee    failed steroid and synvisc injections  . Estrogen deficiency   . GERD (gastroesophageal reflux disease)   . Glaucoma   . History of chicken pox   . History of kidney stones   . History of stomach ulcers 2010s  . HLD (hyperlipidemia)   . HTN (hypertension)   . IDA (iron deficiency anemia) 2012   s/p normal EGD/colonoscopy (iftikhar)  . 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 Neuromuscular disorder (HCC)    sometimes fingers get numb  . Prediabetes   . Tricuspid regurgitation 2012   by echo, EF 73% [place] Flakes)   Past Surgical History:  Procedure Laterality Date  . APPENDECTOMY    . COLONOSCOPY  08/2011   diverticulosis, int hem ([facility]ftikhar)  . CYSTOSCOPY W/ RETROGRADES Left 10/29/2017   Procedure: CYSTOSCOPY WITH RETROGRADE PYELOGRAM;  Surgeon: Riki Altes, MD;  Location: [facility];  Service: Urology;  Laterality: Left;  . CYSTOSCOPY W/ URETERAL STENT PLACEMENT Left 12/03/2017   Procedure: CYSTOSCOPY WITH STENT REPLACEMENT;  Surgeon: Riki Altes, MD;  Location: [facility];  Service: Urology;  Laterality: Left;  . CYSTOSCOPY WITH STENT PLACEMENT Left 10/29/2017   Procedure: CYSTOSCOPY WITH STENT PLACEMENT;  Surgeon: Riki Altes, MD;  Location: [facility];  Service: Urology;  Laterality: Left;  . CYSTOSCOPY WITH URETEROSCOPY Left 12/03/2017   Procedure: CYSTOSCOPY WITH URETEROSCOPY;  Surgeon:  Stoioff, Verna CzechScott C, MD;  Location: [facility];  Service: Urology;  Laterality: Left;  . ESI  multiple, latest 03/2015   R L5/S1, L L3/4, L L4/5 L5/S1, L L4/5 and L5/S1 - minimal relief (Chasnis)  . ESOPHAGOGASTRODUODENOSCOPY  08/2011   WNL  . EYE SURGERY Bilateral    laser eye surgery  . FOOT SURGERY Right   . TOE SURGERY Right 2005   titanium for toe replacement  . VAGINAL HYSTERECTOMY  1980s   prolapsed uterus, ovaries remained   Family History  Problem Relation Age of Onset    . Stroke Father 48       hemorrhagic bleed  . Hypertension Father   . Cancer Mother [id number]       breast  . Breast cancer Mother [phone]  . Cancer Brother [phone]       lung (nonsmoker)  . Breast cancer Paternal Grandmother   . CAD Neg Hx   . Diabetes Neg Hx    Social History   Socioeconomic History  . Marital status: Widowed    Spouse name: Not on file  . Number of children: Not on file  . Years of education: Not on file  . Highest education level: Not on file  Occupational History  . Not on file  Social Needs  . Financial resource strain: Not on file  . Food insecurity:    Worry: Not on file    Inability: Not on file  . Transportation needs:    Medical: Not on file    Non-medical: Not on file  Tobacco Use  . Smoking status: Never Smoker  . Smokeless tobacco: Never Used  Substance and Sexual Activity  . Alcohol use: No    Alcohol/week: 0.0 standard drinks  . Drug use: No  . Sexual activity: Not Currently  Lifestyle  . Physical activity:    Days per week: Not on file    Minutes per session: Not on file  . Stress: Not on file  Relationships  . Social connections:    Talks on phone: Not on file    Gets together: Not on file    Attends religious service: Not on file    Active member of club or organization: Not on file    Attends meetings of clubs or organizations: Not on file    Relationship status: Not on file  Other Topics Concern  . Not on file  Social History Narrative   Lives alone, 1 dog. Widow - heart issues   Children nearby   Occupation: part time Engineer, [employer] worker   Edu: HS   Activity: stays active with family and church   Diet: good water, fruits/vegetables daily    Outpatient Encounter Medications as of 10/10/2018  Medication Sig  . calcium carbonate (OS-CAL) 600 MG TABS tablet Take 600 mg by mouth 2 (two) times daily with a meal.  . cetirizine (ZYRTEC) 10 MG tablet Take 10 mg by mouth daily.  . ferrous sulfate 325 (65 FE) MG tablet Take 1 tablet (325 mg  total) by mouth daily with breakfast.  . HYDROcodone-acetaminophen (NORCO) 7.5-325 MG per tablet Take 1 tablet by mouth 3 (three) times daily as needed for moderate pain. (Dr Yves Dillhasnis)  . ibuprofen (ADVIL,MOTRIN) 200 MG tablet Take 200 mg by mouth every 6 (six) hours as needed.   Marland Kitchen. lisinopril (PRINIVIL,ZESTRIL) 10 MG tablet TAKE 1 TABLET BY MOUTH  DAILY  . mirabegron ER (MYRBETRIQ) 50 MG TB24 tablet Take 1 tablet (50 mg total) by mouth daily.  .Marland Kitchen  Omega-3 Fatty Acids (FISH OIL) 1200 MG CAPS Take 1 capsule by mouth 2 (two) times daily.  . psyllium (METAMUCIL) 58.6 % powder Take 1 packet by mouth daily.  Marland Kitchen senna-docusate (SENOKOT-S) 8.6-50 MG tablet Take 1 tablet by mouth at bedtime as needed for mild constipation. (Patient taking differently: Take 1 tablet by mouth at bedtime. )  . simvastatin (ZOCOR) 10 MG tablet TAKE 1 TABLET BY MOUTH  DAILY  . UNABLE TO FIND Med Name: CBD Oil 750mg  1 QD  . vitamin B-12 (CYANOCOBALAMIN) 500 MCG tablet Take 1 tablet (500 mcg total) by mouth daily.  . [DISCONTINUED] pneumococcal 13-valent conjugate vaccine (PREVNAR 13) SUSP injection    No facility-administered encounter medications on file as of 10/10/2018.     Activities of Daily Living In your present state of health, do you have any difficulty performing the following activities: 10/10/2018 11/26/2017  Hearing? N N  Vision? Y N  Difficulty concentrating or making decisions? N N  Walking or climbing stairs? N N  Dressing or bathing? N N  Doing errands, shopping? N N  Preparing Food and eating ? N -  Using the Toilet? N -  In the past six months, have you accidently leaked urine? N -  Do you have problems with loss of bowel control? N -  Managing your Medications? N -  Managing your Finances? N -  Housekeeping or managing your Housekeeping? N -  Some recent data might be hidden    Patient Care Team: Eustaquio Boyden, MD as PCP - General (Family Medicine) Merri Ray, MD as Referring Physician  (Physical Medicine and Rehabilitation) Lockie Mola, MD as Referring Physician (Ophthalmology)    Assessment:   This is a routine wellness examination for Anam.   Hearing Screening   125Hz  250Hz  500Hz  1000Hz  2000Hz  3000Hz  4000Hz  6000Hz  8000Hz   Right ear:   40 40 40  40    Left ear:   40 40 40  40    Vision Screening Comments: Vision exam in Jan 2020 @ [facility]/Dr. Brasington//Glacuoma specialist Dr. Idelle Crouch   Exercise Activities and Dietary recommendations Current Exercise Habits: The patient has a physically strenuous job, but has no regular exercise apart from work.(works 20 hours per week), Exercise limited by: None identified  Goals    . Patient Stated     Starting 10/10/2018, I will continue to take medications as prescribed.        Fall Risk Fall Risk  10/10/2018 10/04/2017 07/31/2016 04/19/2015  Falls in the past year? 0 No Yes Yes  Number falls in past yr: - - 2 or more 2 or more  Injury with Fall? - - No No   Depression Screen PHQ 2/9 Scores 10/10/2018 10/04/2017 07/31/2016 04/19/2015  PHQ - 2 Score 0 0 0 0  PHQ- 9 Score 0 0 - -     Cognitive Function MMSE - Mini Mental State Exam 10/10/2018 10/04/2017  Orientation to time 5 5  Orientation to Place 5 5  Registration 3 3  Attention/ Calculation 0 0  Recall 3 2  Recall-comments - unable to recall 1 of 3 words  Language- name 2 objects 0 0  Language- repeat 1 1  Language- follow 3 step command 3 3  Language- read & follow direction 0 0  Write a sentence 0 0  Copy design 0 0  Total score 20 19     PLEASE NOTE: A Mini-Cog screen was completed. Maximum score is 20. A value of 0  denotes this part of Folstein MMSE was not completed or the patient failed this part of the Mini-Cog screening.   Mini-Cog Screening Orientation to Time - Max 5 pts Orientation to Place - Max 5 pts Registration - Max 3 pts Recall - Max 3 pts Language Repeat - Max 1 pts Language Follow 3 Step Command - Max 3 pts      Immunization History  Administered Date(s) Administered  . Influenza, High Dose Seasonal PF 07/07/2015, 06/29/2016, 06/26/2018  . Influenza-Unspecified 05/31/2014, 06/14/2017  . Pneumococcal Conjugate-13 07/31/2014  . Pneumococcal Polysaccharide-23 06/29/2016  . Tdap 04/17/2013  . Zoster 08/31/2008  . Zoster Recombinat (Shingrix) 06/19/2017, 11/13/2017    Screening Tests Health Maintenance  Topic Date Due  . MAMMOGRAM  06/15/2019  . COLONOSCOPY  08/09/2021  . DTaP/Tdap/Td (2 - Td) 04/18/2023  . TETANUS/TDAP  04/18/2023  . INFLUENZA VACCINE  Completed  . DEXA SCAN  Completed  . Hepatitis C Screening  Completed  . PNA vac Low Risk Adult  Completed      Plan:     I have personally reviewed, addressed, and noted the following in the patient's chart:  A. Medical and social history B. Use of alcohol, tobacco or illicit drugs  C. Current medications and supplements D. Functional ability and status E.  Nutritional status F.  Physical activity G. Advance directives H. List of other physicians I.  Hospitalizations, surgeries, and ER visits in previous 12 months J.  Vitals K. Screenings to include hearing, vision, cognitive, depression L. Referrals and appointments - none  In addition, I have reviewed and discussed with patient certain preventive protocols, quality metrics, and best practice recommendations. A written personalized care plan for preventive services as well as general preventive health recommendations were provided to patient.  See attached scanned questionnaire for additional information.   Signed,   Randa Evens, MHA, BS, LPN Health Coach

## 2018-10-11 NOTE — Progress Notes (Signed)
   Subjective:    Patient ID: Brandy Young, female    DOB: 01-12-1946, 73 y.o.   MRN: [id number]  HPI  I reviewed health advisor's note, was available for consultation, and agree with documentation and plan.   Review of Systems     Objective:   Physical Exam         Assessment & Plan:

## 2018-10-14 ENCOUNTER — Encounter: Payer: Medicare Other | Admitting: Family Medicine

## 2018-10-31 ENCOUNTER — Encounter: Payer: Self-pay | Admitting: Family Medicine

## 2018-10-31 ENCOUNTER — Ambulatory Visit (INDEPENDENT_AMBULATORY_CARE_PROVIDER_SITE_OTHER): Payer: Medicare Other | Admitting: Family Medicine

## 2018-10-31 VITALS — BP 122/70 | HR 63 | Temp 98.7°F | Ht <= 58 in | Wt 124.4 lb

## 2018-10-31 DIAGNOSIS — M8580 Other specified disorders of bone density and structure, unspecified site: Secondary | ICD-10-CM

## 2018-10-31 DIAGNOSIS — H401113 Primary open-angle glaucoma, right eye, severe stage: Secondary | ICD-10-CM

## 2018-10-31 DIAGNOSIS — D509 Iron deficiency anemia, unspecified: Secondary | ICD-10-CM

## 2018-10-31 DIAGNOSIS — R7303 Prediabetes: Secondary | ICD-10-CM

## 2018-10-31 DIAGNOSIS — K219 Gastro-esophageal reflux disease without esophagitis: Secondary | ICD-10-CM

## 2018-10-31 DIAGNOSIS — I10 Essential (primary) hypertension: Secondary | ICD-10-CM

## 2018-10-31 DIAGNOSIS — H409 Unspecified glaucoma: Secondary | ICD-10-CM

## 2018-10-31 DIAGNOSIS — E538 Deficiency of other specified B group vitamins: Secondary | ICD-10-CM

## 2018-10-31 DIAGNOSIS — Z0000 Encounter for general adult medical examination without abnormal findings: Secondary | ICD-10-CM

## 2018-10-31 DIAGNOSIS — R0989 Other specified symptoms and signs involving the circulatory and respiratory systems: Secondary | ICD-10-CM

## 2018-10-31 DIAGNOSIS — M7989 Other specified soft tissue disorders: Secondary | ICD-10-CM

## 2018-10-31 DIAGNOSIS — E785 Hyperlipidemia, unspecified: Secondary | ICD-10-CM

## 2018-10-31 DIAGNOSIS — Z7189 Other specified counseling: Secondary | ICD-10-CM

## 2018-10-31 MED ORDER — FAMOTIDINE 20 MG PO TABS: 20.0000 mg | ORAL_TABLET | Freq: Every day | ORAL | 3 refills | Status: AC

## 2018-10-31 MED ORDER — FERROUS SULFATE 325 (65 FE) MG PO TABS: 325.0000 mg | ORAL_TABLET | ORAL | 3 refills | Status: AC

## 2018-10-31 MED ORDER — VITAMIN D3 25 MCG (1000 UT) PO CAPS: 1.0000 | ORAL_CAPSULE | Freq: Every day | ORAL | Status: AC

## 2018-10-31 MED ORDER — CALCIUM CITRATE 333 MG PO TABS: 2.0000 | ORAL_TABLET | Freq: Two times a day (BID) | ORAL | Status: AC

## 2018-10-31 MED ORDER — HYDROCHLOROTHIAZIDE 12.5 MG PO CAPS: 12.5000 mg | ORAL_CAPSULE | Freq: Every day | ORAL | 3 refills | Status: DC | PRN

## 2018-10-31 MED ORDER — LISINOPRIL 10 MG PO TABS: 10.0000 mg | ORAL_TABLET | Freq: Every day | ORAL | 3 refills | Status: DC

## 2018-10-31 NOTE — Assessment & Plan Note (Addendum)
Noted again, with good pulses. Discussed conservative treatment (compression stockings, leg elevation, good hydration, avoiding salt. If not improving would consider venous imaging.  rec trial hctz 12.5mg  QD PRN

## 2018-10-31 NOTE — Assessment & Plan Note (Signed)
Advanced directives: has not set up but has discussed with children. HCPOA - children. Packet provided today.

## 2018-10-31 NOTE — Assessment & Plan Note (Signed)
Noticing increasing flares - wlil start pepcid, if no improvement, let us know for omeprazole refill.

## 2018-10-31 NOTE — Assessment & Plan Note (Signed)
Regularly sees eye doctor.  

## 2018-10-31 NOTE — Assessment & Plan Note (Signed)
Chronic, stable. Continue current regimen. 

## 2018-10-31 NOTE — Progress Notes (Signed)
BP 122/70 (BP Location: Left Arm, Patient Position: Sitting, Cuff Size: Normal)   Pulse 63   Temp 98.7 F (37.1 C) (Oral)   Ht  (1.473 m)   Wt 124 lb 6 oz (56.4 kg)   SpO2 100%   BMI 25.99 kg/m    CC: CPE Subjective:    Patient ID: Brandy Young, female    DOB: Jan 10, 1946, 73 y.o.   MRN: [id number]  HPI: Brandy Young is a 73 y.o. female presenting on 10/31/2018 for Annual Exam (Pt 2.  Wants to restart reflux med. )    Sharyn Creamer last month for medicare wellness visit. Note reviewed.   Continues working part time at [employer].   Noticing increasing L>R leg swelling from thigh down into ankles ongoing over the last year. No redness warmth or pain of leg. Has leg cramps at night time, never during the day. Sees ortho, told pain comes from back.   Using OTC arthritis creams. Tried CBD oil without benefit.   Chronic R shoulder pain - xrays last month showed R bone spur. Sees Dr Yves Dill. Significant R sided pain with radiation from neck down arm. She did receive cortisone injection.   Skin growth left hand.   Very worn out at night time after a long day at work. No daytime somnolence. Takes a nap when she gets at home. Averages 8-10 hours of sleep. Only works 4 hours a day - [employer].   Increasing GERD - requests medicine.   Preventative: COLONOSCOPY Date: 08/2011 diverticulosis, int hem (Ift[facility]har) Well woman exam - s/p hysterectomy, ovaries remain. Aged out. Mammogram -05/2018 birads1 Lung cancer screen - not eligible DEXA 10/2017 T score -2.3 hip (osteopenia) Flu shot - yearly Tdap 2014 Prevnar 07/2014.Pneumovax 2017. zostavax 2010. shingrix - 05/2017, 10/2017 Advanced directives: has not set up but has discussed with children. HCPOA - children. Packet provided today. Seat belt use discussed Sunscreen use discussed. No changing moles on skin.  Non smoker  Alcohol - none Dentist q65mo Eye exam regularly (follows glaucoma)  Lives alone, 1 dog.  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 Interim medical history since our last visit reviewed. Allergies and medications reviewed and updated. Outpatient Medications Prior to Visit  Medication Sig Dispense Refill  . cetirizine (ZYRTEC) 10 MG tablet Take 10 mg by mouth daily.    Marland Kitchen HYDROcodone-acetaminophen (NORCO) 7.5-325 MG per tablet Take 1 tablet by mouth 3 (three) times daily as needed for moderate pain. (Dr Yves Dill)    . ibuprofen (ADVIL,MOTRIN) 200 MG tablet Take 200 mg by mouth every 6 (six) hours as needed.     . mirabegron ER (MYRBETRIQ) 50 MG TB24 tablet Take 1 tablet (50 mg total) by mouth daily. 30 tablet 11  . Omega-3 Fatty Acids (FISH OIL) 1200 MG CAPS Take 1 capsule by mouth 2 (two) times daily.    . psyllium (METAMUCIL) 58.6 % powder Take 1 packet by mouth daily.    Marland Kitchen senna-docusate (SENOKOT-S) 8.6-50 MG tablet Take 1 tablet by mouth at bedtime as needed for mild constipation. (Patient taking differently: Take 1 tablet by mouth at bedtime. ) 30 tablet 0  . simvastatin (ZOCOR) 10 MG tablet TAKE 1 TABLET BY MOUTH  DAILY 90 tablet 1  . UNABLE  TO FIND Med Name: CBD Oil  1 QD    . CALCIUM CITRATE PO Take 2 tablets by mouth 2 (two) times daily.    Marland Kitchen lisinopril (PRINIVIL,ZESTRIL) 10 MG tablet TAKE 1 TABLET BY MOUTH  DAILY 90 tablet 0  . vitamin B-12 (CYANOCOBALAMIN) 500 MCG tablet Take 1 tablet (500 mcg total) by mouth daily. (Patient not taking: Reported on 10/31/2018)    . calcium carbonate (OS-CAL) 600 MG TABS tablet Take 600 mg by mouth 2 (two) times daily with a meal.    . ferrous sulfate 325 (65 FE) MG tablet Take 1 tablet (325 mg total) by mouth daily with breakfast. (Patient not taking: Reported on 10/31/2018)  3   No  facility-administered medications prior to visit.      Per HPI unless specifically indicated in ROS section below Review of Systems  Constitutional: Negative for activity change, appetite change, chills, fatigue, fever and unexpected weight change.  HENT: Negative for hearing loss.   Eyes: Negative for visual disturbance.  Respiratory: Negative for cough, chest tightness, shortness of breath and wheezing.   Cardiovascular: Positive for leg swelling. Negative for chest pain and palpitations.  Gastrointestinal: Negative for abdominal distention, abdominal pain, blood in stool, constipation, diarrhea, nausea and vomiting.  Genitourinary: Negative for difficulty urinating and hematuria.  Musculoskeletal: Negative for arthralgias, myalgias and neck pain.  Skin: Negative for rash.  Neurological: Negative for dizziness, seizures, syncope and headaches.  Hematological: Negative for adenopathy. Does not bruise/bleed easily.  Psychiatric/Behavioral: Positive for dysphoric mood. The patient is not nervous/anxious.    Objective:    BP 122/70 (BP Location: Left Arm, Patient Position: Sitting, Cuff Size: Normal)   Pulse 63   Temp 98.7 F (37.1 C) (Oral)   Ht  (1.473 m)   Wt 124 lb 6 oz (56.4 kg)   SpO2 100%   BMI 25.99 kg/m   Wt Readings from Last 3 Encounters:  10/31/18 124 lb 6 oz (56.4 kg)  07/18/18 118 lb (53.5 kg)  01/13/18 119 lb 12.8 oz (54.3 kg)    Physical Exam Vitals signs and nursing note reviewed.  Constitutional:      General: She is not in acute distress.    Appearance: Normal appearance. She is well-developed.  HENT:     Head: Normocephalic and atraumatic.     Right Ear: Hearing, tympanic membrane, ear canal and external ear normal.     Left Ear: Hearing, tympanic membrane, ear canal and external ear normal.     Nose: Nose normal.     Mouth/Throat:     Mouth: Mucous membranes are moist.     Pharynx: Uvula midline. No oropharyngeal exudate or posterior  oropharyngeal erythema.  Eyes:     General: No scleral icterus.    Conjunctiva/sclera: Conjunctivae normal.     Pupils: Pupils are equal, round, and reactive to light.  Neck:     Musculoskeletal: Normal range of motion and neck supple.     Vascular: Carotid bruit (L sided) present.  Cardiovascular:     Rate and Rhythm: Normal rate and regular rhythm.     Pulses: Normal pulses.          Radial pulses are 2+ on the right side and 2+ on the left side.     Heart sounds: Normal heart sounds. No murmur.  Pulmonary:     Effort: Pulmonary effort is normal. No respiratory distress.     Breath sounds: Normal breath sounds. No wheezing, rhonchi or rales.  Abdominal:  General: Bowel sounds are normal. There is no distension.     Palpations: Abdomen is soft. There is no mass.     Tenderness: There is no abdominal tenderness. There is no guarding or rebound.  Musculoskeletal: Normal range of motion.     Right lower leg: No edema.     Left lower leg: No edema.     Comments: R calf circ 14.5 in L calf circ 15.25 in 2+ DP bilaterally  Lymphadenopathy:     Cervical: No cervical adenopathy.  Skin:    General: Skin is warm and dry.     Findings: No rash.  Neurological:     Mental Status: She is alert and oriented to person, place, and time.     Comments: CN grossly intact, station and gait intact  Psychiatric:        Mood and Affect: Mood normal.        Behavior: Behavior normal.        Thought Content: Thought content normal.        Judgment: Judgment normal.       Results for orders placed or performed in visit on 10/10/18  IBC Panel([facility])  Result Value Ref Range   Iron 112 42 - 145 ug/dL   Transferrin 322.5 672.0 - 360.0 mg/dL   Saturation Ratios 91.9 20.0 - 50.0 %  Ferritin  Result Value Ref Range   Ferritin 27.7 10.0 - 291.0 ng/mL  CBC with Differential/Platelet  Result Value Ref Range   WBC 5.6 4.0 - 10.5 K/uL   RBC 3.58 (L) 3.87 - 5.11 Mil/uL   Hemoglobin 11.3 (L) 12.0  - 15.0 g/dL   HCT 80.2 (L) 21.7 - 98.1 %   MCV 94.0 78.0 - 100.0 fl   MCHC 33.7 30.0 - 36.0 g/dL   RDW 02.5 48.6 - 28.2 %   Platelets 315.0 150.0 - 400.0 K/uL   Neutrophils Relative % 55.1 43.0 - 77.0 %   Lymphocytes Relative 33.0 12.0 - 46.0 %   Monocytes Relative 7.4 3.0 - 12.0 %   Eosinophils Relative 3.9 0.0 - 5.0 %   Basophils Relative 0.6 0.0 - 3.0 %   Neutro Abs 3.1 1.4 - 7.7 K/uL   Lymphs Abs 1.9 0.7 - 4.0 K/uL   Monocytes Absolute 0.4 0.1 - 1.0 K/uL   Eosinophils Absolute 0.2 0.0 - 0.7 K/uL   Basophils Absolute 0.0 0.0 - 0.1 K/uL  Comprehensive metabolic panel  Result Value Ref Range   Sodium 142 135 - 145 mEq/L   Potassium 4.1 3.5 - 5.1 mEq/L   Chloride 109 96 - 112 mEq/L   CO2 24 19 - 32 mEq/L   Glucose, Bld 82 70 - 99 mg/dL   BUN 19 6 - 23 mg/dL   Creatinine, Ser 4.17 0.40 - 1.20 mg/dL   Total Bilirubin 0.6 0.2 - 1.2 mg/dL   Alkaline Phosphatase 65 39 - 117 U/L   AST 24 0 - 37 U/L   ALT 15 0 - 35 U/L   Total Protein 6.2 6.0 - 8.3 g/dL   Albumin 4.1 3.5 - 5.2 g/dL   Calcium 9.4 8.4 - 53.0 mg/dL   GFR 10.40 >45.91 mL/min  Lipid Panel  Result Value Ref Range   Cholesterol 237 (H) 0 - 200 mg/dL   Triglycerides 36.8 0.0 - 149.0 mg/dL   HDL 59.92 >34.14 mg/dL   VLDL 43.6 0.0 - 01.6 mg/dL   LDL Cholesterol 580 (H) 0 - 99 mg/dL   Total CHOL/HDL  Ratio 3    NonHDL 144.31   TSH  Result Value Ref Range   TSH 0.88 0.35 - 4.50 uIU/mL  Hemoglobin A1c  Result Value Ref Range   Hgb A1c MFr Bld 5.9 4.6 - 6.5 %  Vitamin D, 25-hydroxy  Result Value Ref Range   VITD 32.15 30.00 - 100.00 ng/mL   Assessment & Plan:   Problem List Items Addressed This Visit    Primary open angle glaucoma (POAG) of right eye, severe stage    Regularly sees eye doctor.       Prediabetes    Reviewed with patient.      Osteopenia    Reviewed with patient as well as calcium/vit D intake.       Left leg swelling    Noted again, with good pulses. Discussed conservative treatment  (compression stockings, leg elevation, good hydration, avoiding salt. If not improving would consider venous imaging.  rec trial hctz 12.5mg  QD PRN       IDA (iron deficiency anemia)    Change to ferrous sulfate QOD dosing.      Relevant Medications   ferrous sulfate 325 (65 FE) MG tablet   HTN (hypertension)    Chronic, stable. Continue current regimen.       Relevant Medications   lisinopril (PRINIVIL,ZESTRIL) 10 MG tablet   hydrochlorothiazide (MICROZIDE) 12.5 MG capsule   HLD (hyperlipidemia)    Chronic, stable on simvastatin. Also on fish oil. Continue. The 10-year ASCVD risk score Denman George DC Montez Hageman., et al., 2013) is: 19.8%   Values used to calculate the score:     Age: 66 years     Sex: Female     Is Non-Hispanic African American: No     Diabetic: No     Tobacco smoker: No     Systolic Blood Pressure: 122 mmHg     Is BP treated: Yes     HDL Cholesterol: 92.2 mg/dL     Total Cholesterol: 237 mg/dL       Relevant Medications   lisinopril (PRINIVIL,ZESTRIL) 10 MG tablet   hydrochlorothiazide (MICROZIDE) 12.5 MG capsule   Health maintenance examination - Primary    Preventative protocols reviewed and updated unless pt declined. Discussed healthy diet and lifestyle.       Glaucoma    Regularly sees eye doctor      GERD (gastroesophageal reflux disease)    Noticing increasing flares - wlil start pepcid, if no improvement, let us know for omeprazole refill.       Relevant Medications   famotidine (PEPCID) 20 MG tablet   B12 deficiency    rec restart b12 daily.      Advanced care planning/counseling discussion    Advanced directives: has not set up but has discussed with children. HCPOA - children. Packet provided today.       Other Visit Diagnoses    Left carotid bruit       Relevant Orders   VAS US CAROTID       Meds ordered this encounter  Medications  . Calcium Citrate 333 MG TABS    Sig: Take 2 tablets by mouth 2 (two) times daily.  .  famotidine (PEPCID) 20 MG tablet    Sig: Take 1 tablet (20 mg total) by mouth at bedtime.    Dispense:  30 tablet    Refill:  3  . ferrous sulfate 325 (65 FE) MG tablet    Sig: Take 1 tablet (325 mg total) by mouth every  other day.    Refill:  3  . Cholecalciferol (VITAMIN D3) 25 MCG (1000 UT) CAPS    Sig: Take 1 capsule (1,000 Units total) by mouth daily.    Dispense:  30 capsule  . lisinopril (PRINIVIL,ZESTRIL) 10 MG tablet    Sig: Take 1 tablet (10 mg total) by mouth daily.    Dispense:  90 tablet    Refill:  3  . hydrochlorothiazide (MICROZIDE) 12.5 MG capsule    Sig: Take 1 capsule (12.5 mg total) by mouth daily as needed (leg swelling).    Dispense:  30 capsule    Refill:  3   No orders of the defined types were placed in this encounter.   Follow up plan: Return in about 1 year (around 10/31/2019) for annual exam, prior fasting for blood work, medicare wellness visit.  Eustaquio Boyden, MD

## 2018-10-31 NOTE — Assessment & Plan Note (Signed)
Reviewed with patient as well as calcium/vit D intake.

## 2018-10-31 NOTE — Assessment & Plan Note (Signed)
Reviewed with patient

## 2018-10-31 NOTE — Patient Instructions (Addendum)
Bring [place] copy of your advanced directive. Start vitamin D3 1000 units daily. Restart b12 - this may help energy levels.  Continue lisinopril 10mg  daily. Add on hydrochlorothiazide 12.5mg  once daily as needed for leg swelling. Watch for dizziness.  We will schedule carotid ultrasound  Health Maintenance After Age 75 After age 7, you are at a higher risk for certain long-term diseases and infections as well as injuries from falls. Falls are a major cause of broken bones and head injuries in people who are older than age 13. Getting regular preventive care can help to keep you healthy and well. Preventive care includes getting regular testing and making lifestyle changes as recommended by your health care provider. Talk with your health care provider about:  Which screenings and tests you should have. A screening is a test that checks for a disease when you have no symptoms.  A diet and exercise plan that is right for you. What should I know about screenings and tests to prevent falls? Screening and testing are the best ways to find a health problem early. Early diagnosis and treatment give you the best chance of managing medical conditions that are common after age 58. Certain conditions and lifestyle choices may make you more likely to have a fall. Your health care provider may recommend:  Regular vision checks. Poor vision and conditions such as cataracts can make you more likely to have a fall. If you wear glasses, make sure to get your prescription updated if your vision changes.  Medicine review. Work with your health care provider to regularly review all of the medicines you are taking, including over-the-counter medicines. Ask your health care provider about any side effects that may make you more likely to have a fall. Tell your health care provider if any medicines that you take make you feel dizzy or sleepy.  Osteoporosis screening. Osteoporosis is a condition that causes the bones to get  weaker. This can make the bones weak and cause them to break more easily.  Blood pressure screening. Blood pressure changes and medicines to control blood pressure can make you feel dizzy.  Strength and balance checks. Your health care provider may recommend certain tests to check your strength and balance while standing, walking, or changing positions.  Foot health exam. Foot pain and numbness, as well as not wearing proper footwear, can make you more likely to have a fall.  Depression screening. You may be more likely to have a fall if you have a fear of falling, feel emotionally low, or feel unable to do activities that you used to do.  Alcohol use screening. Using too much alcohol can affect your balance and may make you more likely to have a fall. What actions can I take to lower my risk of falls? General instructions  Talk with your health care provider about your risks for falling. Tell your health care provider if: ? You fall. Be sure to tell your health care provider about all falls, even ones that seem minor. ? You feel dizzy, sleepy, or off-balance.  Take over-the-counter and prescription medicines only as told by your health care provider. These include any supplements.  Eat a healthy diet and maintain a healthy weight. A healthy diet includes low-fat dairy products, low-fat (lean) meats, and fiber from whole grains, beans, and lots of fruits and vegetables. Home safety  Remove any tripping hazards, such as rugs, cords, and clutter.  Install safety equipment such as grab bars in bathrooms and safety  rails on stairs.  Keep rooms and walkways well-lit. Activity   Follow a regular exercise program to stay fit. This will help you maintain your balance. Ask your health care provider what types of exercise are appropriate for you.  If you need a cane or walker, use it as recommended by your health care provider.  Wear supportive shoes that have nonskid soles. Lifestyle  Do  not drink alcohol if your health care provider tells you not to drink.  If you drink alcohol, limit how much you have: ? 0-1 drink a day for women. ? 0-2 drinks a day for men.  Be aware of how much alcohol is in your drink. In the [place] one drink equals one typical bottle of beer (12 oz), one-half glass of wine (5 oz), or one shot of hard liquor (1 oz).  Do not use any products that contain nicotine or tobacco, such as cigarettes and e-cigarettes. If you need help quitting, ask your health care provider. Summary  Having a healthy lifestyle and getting preventive care can help to protect your health and wellness after age 46.  Screening and testing are the best way to find a health problem early and help you avoid having a fall. Early diagnosis and treatment give you the best chance for managing medical conditions that are more common for people who are older than age 92.  Falls are a major cause of broken bones and head injuries in people who are older than age 29. Take precautions to prevent a fall at home.  Work with your health care provider to learn what changes you can make to improve your health and wellness and to prevent falls. This information is not intended to replace advice given to you by your health care provider. Make sure you discuss any questions you have with your health care provider. Document Released: 06/30/2017 Document Revised: 06/30/2017 Document Reviewed: 06/30/2017 Elsevier Interactive Patient Education  2019 [employer].

## 2018-10-31 NOTE — Assessment & Plan Note (Signed)
Preventative protocols reviewed and updated unless pt declined. Discussed healthy diet and lifestyle.  

## 2018-10-31 NOTE — Assessment & Plan Note (Signed)
Change to ferrous sulfate QOD dosing.

## 2018-10-31 NOTE — Assessment & Plan Note (Signed)
rec restart b12 daily.

## 2018-10-31 NOTE — Assessment & Plan Note (Signed)
Chronic, stable on simvastatin. Also on fish oil. Continue. The 10-year ASCVD risk score Denman George DC Montez Hageman., et al., 2013) is: 19.8%   Values used to calculate the score:     Age: 73 years     Sex: Female     Is Non-Hispanic African American: No     Diabetic: No     Tobacco smoker: No     Systolic Blood Pressure: 122 mmHg     Is BP treated: Yes     HDL Cholesterol: 92.2 mg/dL     Total Cholesterol: 237 mg/dL

## 2018-12-19 DIAGNOSIS — M48062 Spinal stenosis, lumbar region with neurogenic claudication: Secondary | ICD-10-CM | POA: Diagnosis not present

## 2018-12-19 DIAGNOSIS — M7541 Impingement syndrome of right shoulder: Secondary | ICD-10-CM | POA: Diagnosis not present

## 2018-12-19 DIAGNOSIS — M5416 Radiculopathy, lumbar region: Secondary | ICD-10-CM | POA: Diagnosis not present

## 2018-12-19 DIAGNOSIS — M5136 Other intervertebral disc degeneration, lumbar region: Secondary | ICD-10-CM | POA: Diagnosis not present

## 2019-01-09 ENCOUNTER — Other Ambulatory Visit: Payer: Self-pay | Admitting: Family Medicine

## 2019-01-09 DIAGNOSIS — H401113 Primary open-angle glaucoma, right eye, severe stage: Secondary | ICD-10-CM | POA: Diagnosis not present

## 2019-01-30 ENCOUNTER — Telehealth: Payer: Self-pay | Admitting: Family Medicine

## 2019-01-30 MED ORDER — HYDROCHLOROTHIAZIDE 12.5 MG PO CAPS: 12.5000 mg | ORAL_CAPSULE | Freq: Every day | ORAL | 3 refills | Status: DC | PRN

## 2019-01-30 NOTE — Telephone Encounter (Signed)
Patient is requesting a refill  Hydrochlorothizide   Patient will be out by the end of week   [facility] [facility]    Patient stated that when she called the pharmacy for this prescription they are telling her it is ready but they are waiting to hear from our office.    PHONE-[phone]

## 2019-01-30 NOTE — Telephone Encounter (Signed)
E-scribed refill.  Spoke with pt notifying her rx was sent.

## 2019-02-08 ENCOUNTER — Other Ambulatory Visit: Payer: Self-pay

## 2019-02-08 MED ORDER — MIRABEGRON ER 50 MG PO TB24: 50.0000 mg | ORAL_TABLET | Freq: Every day | ORAL | 6 refills | Status: DC

## 2019-02-09 ENCOUNTER — Other Ambulatory Visit: Payer: Self-pay

## 2019-02-09 ENCOUNTER — Ambulatory Visit (INDEPENDENT_AMBULATORY_CARE_PROVIDER_SITE_OTHER): Payer: Medicare Other

## 2019-02-09 DIAGNOSIS — R0989 Other specified symptoms and signs involving the circulatory and respiratory systems: Secondary | ICD-10-CM

## 2019-02-10 ENCOUNTER — Encounter: Payer: Self-pay | Admitting: Family Medicine

## 2019-02-10 DIAGNOSIS — R0989 Other specified symptoms and signs involving the circulatory and respiratory systems: Secondary | ICD-10-CM | POA: Insufficient documentation

## 2019-03-20 DIAGNOSIS — M48062 Spinal stenosis, lumbar region with neurogenic claudication: Secondary | ICD-10-CM | POA: Diagnosis not present

## 2019-03-20 DIAGNOSIS — M7541 Impingement syndrome of right shoulder: Secondary | ICD-10-CM | POA: Diagnosis not present

## 2019-03-20 DIAGNOSIS — M5416 Radiculopathy, lumbar region: Secondary | ICD-10-CM | POA: Diagnosis not present

## 2019-03-20 DIAGNOSIS — M5136 Other intervertebral disc degeneration, lumbar region: Secondary | ICD-10-CM | POA: Diagnosis not present

## 2019-05-21 ENCOUNTER — Other Ambulatory Visit: Payer: Self-pay | Admitting: Urology

## 2019-05-21 DIAGNOSIS — Z87442 Personal history of urinary calculi: Secondary | ICD-10-CM

## 2019-05-29 ENCOUNTER — Other Ambulatory Visit: Payer: Self-pay | Admitting: Family Medicine

## 2019-06-30 DIAGNOSIS — M48062 Spinal stenosis, lumbar region with neurogenic claudication: Secondary | ICD-10-CM | POA: Diagnosis not present

## 2019-06-30 DIAGNOSIS — M5416 Radiculopathy, lumbar region: Secondary | ICD-10-CM | POA: Diagnosis not present

## 2019-06-30 DIAGNOSIS — M5136 Other intervertebral disc degeneration, lumbar region: Secondary | ICD-10-CM | POA: Diagnosis not present

## 2019-06-30 DIAGNOSIS — M7541 Impingement syndrome of right shoulder: Secondary | ICD-10-CM | POA: Diagnosis not present

## 2019-07-10 DIAGNOSIS — H401133 Primary open-angle glaucoma, bilateral, severe stage: Secondary | ICD-10-CM | POA: Diagnosis not present

## 2019-07-17 ENCOUNTER — Ambulatory Visit: Payer: Medicare Other | Admitting: Urology

## 2019-07-28 ENCOUNTER — Ambulatory Visit: Payer: Medicare Other | Admitting: Urology

## 2019-07-31 ENCOUNTER — Ambulatory Visit: Payer: Medicare Other | Admitting: Urology

## 2019-07-31 ENCOUNTER — Other Ambulatory Visit: Payer: Self-pay

## 2019-07-31 ENCOUNTER — Ambulatory Visit
Admission: RE | Admit: 2019-07-31 | Discharge: 2019-07-31 | Disposition: A | Discharge status: Home / Self Care | Payer: Medicare Other | Source: Ambulatory Visit | Attending: Urology | Admitting: Urology

## 2019-07-31 ENCOUNTER — Ambulatory Visit
Admission: RE | Admit: 2019-07-31 | Discharge: 2019-07-31 | Disposition: A | Discharge status: Home / Self Care | Payer: Medicare Other | Attending: Urology | Admitting: Urology

## 2019-07-31 ENCOUNTER — Encounter: Payer: Self-pay | Admitting: Urology

## 2019-07-31 VITALS — BP 122/80 | HR 74 | Ht 59.0 in | Wt 119.0 lb

## 2019-07-31 DIAGNOSIS — N3281 Overactive bladder: Secondary | ICD-10-CM | POA: Insufficient documentation

## 2019-07-31 DIAGNOSIS — Z87442 Personal history of urinary calculi: Secondary | ICD-10-CM

## 2019-07-31 DIAGNOSIS — N3941 Urge incontinence: Secondary | ICD-10-CM

## 2019-07-31 DIAGNOSIS — N200 Calculus of kidney: Secondary | ICD-10-CM | POA: Diagnosis not present

## 2019-07-31 NOTE — Progress Notes (Signed)
07/31/2019 1:56 PM   Brandy BrookesPatricia D Young 03-06-1946 [id number]  Referring provider: Eustaquio BoydenGutierrez, Javier, MD [address] [place],  [place] [id number]  Chief Complaint  Patient presents with  . Follow-up    Urologic history: 1.  History of stone disease -Ureteroscopic removal 4 mm left distal ureteral calculus 11/2017  2.  Overactive bladder    HPI: Brandy Young presents for a 4629-month follow-up.  She denies recurrent flank, abdominal, pelvic pain.  KUB performed today was reviewed and there is scoliosis present.  Marked amount of stool and bowel gas present obscuring the renal outlines however no definite suspicious calcifications are seen.  She does have overactive bladder with urge incontinence.  Oxybutynin was not effective.  At her last visit she was given a trial of Myrbetriq which she states was effective however the cost is increased and she has not been able to afford.   PMH: Past Medical History:  Diagnosis Date  . Allergic rhinitis due to pollen   . Cataract   . Complication of anesthesia 10/2017   hypotensive episode after cysto requiring ICU stay  . DDD (degenerative disc disease), lumbar    with spinal stenosis and neurogenic claudication s/p ESI (Brandy Young)  . DJD (degenerative joint disease) of knee    failed steroid and synvisc injections  . E. coli UTI 11/05/2017  . Estrogen deficiency   . GERD (gastroesophageal reflux disease)   . Glaucoma   . History of chicken pox   . History of kidney stones   . History of stomach ulcers 2010s  . HLD (hyperlipidemia)   . HTN (hypertension)   . IDA (iron deficiency anemia) 2012   s/p normal EGD/colonoscopy (Brandy Young)  . 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 Neuromuscular disorder (HCC)    sometimes fingers get numb  . Prediabetes   . Tricuspid regurgitation 2012   by echo, EF 73% Brandy Flakes([place])    Surgical History:  Past Surgical History:  Procedure Laterality Date  . APPENDECTOMY    . COLONOSCOPY  08/2011   diverticulosis, int hem (Brandy Young)  . CYSTOSCOPY W/ RETROGRADES Left 10/29/2017   Procedure: CYSTOSCOPY WITH RETROGRADE PYELOGRAM;  Surgeon: Brandy AltesStoioff, Scott C, MD;  Location: [facility];  Service: Urology;  Laterality: Left;  . CYSTOSCOPY W/ URETERAL STENT PLACEMENT Left 12/03/2017   Procedure: CYSTOSCOPY WITH STENT REPLACEMENT;  Surgeon: Brandy AltesStoioff, Scott C, MD;  Location: [facility];  Service: Urology;  Laterality: Left;  . CYSTOSCOPY WITH STENT PLACEMENT Left 10/29/2017   Procedure: CYSTOSCOPY WITH STENT PLACEMENT;  Surgeon: Brandy AltesStoioff, Scott C, MD;  Location: [facility];  Service: Urology;  Laterality: Left;  . CYSTOSCOPY WITH URETEROSCOPY Left 12/03/2017   Procedure: CYSTOSCOPY WITH URETEROSCOPY;  Surgeon: Brandy AltesStoioff, Scott C, MD;  Location: [facility];  Service: Urology;  Laterality: Left;  . ESI  multiple, latest 03/2015   R L5/S1, L L3/4, L L4/5 L5/S1, L L4/5 and L5/S1 - minimal relief (Brandy Young)  . ESOPHAGOGASTRODUODENOSCOPY  08/2011   WNL  . EYE SURGERY Bilateral    laser eye surgery for glaucoma  . FOOT SURGERY Right   . TOE SURGERY Right 2005   titanium for toe replacement  . VAGINAL HYSTERECTOMY  1980s   prolapsed uterus, ovaries remained    Home Medications:  Allergies as of 07/31/2019      Reactions   Neurontin [gabapentin] Photosensitivity, Rash, Other (See Comments)   Blurry vision, off balance  Niacin And Related Hives      Medication List       Accurate as of July 31, 2019  1:56 PM. If you have any questions, ask your nurse or doctor.        Calcium Citrate 333 MG Tabs Take 2 tablets by mouth 2 (two) times daily.   cetirizine 10 MG tablet Commonly known as: ZYRTEC Take 10 mg by mouth daily.   famotidine 20 MG tablet Commonly known as: Pepcid Take 1 tablet (20 mg total) by mouth at bedtime.   ferrous sulfate 325 (65 FE) MG tablet Take 1 tablet (325 mg total) by mouth every  other day.   Fish Oil 1200 MG Caps Take 1 capsule by mouth 2 (two) times daily.   hydrochlorothiazide 12.5 MG capsule Commonly known as: MICROZIDE TAKE 1 CAPSULE BY MOUTH ONCE DAILY AS NEEDED FOR  LEG  SWELLING   HYDROcodone-acetaminophen 7.5-325 MG tablet Commonly known as: NORCO Take 1 tablet by mouth 3 (three) times daily as needed for moderate pain. (Dr Brandy Young)   ibuprofen 200 MG tablet Commonly known as: ADVIL Take 200 mg by mouth every 6 (six) hours as needed.   lisinopril 10 MG tablet Commonly known as: ZESTRIL Take 1 tablet (10 mg total) by mouth daily.   mirabegron ER 50 MG Tb24 tablet Commonly known as: MYRBETRIQ Take 1 tablet (50 mg total) by mouth daily.   psyllium 58.6 % powder Commonly known as: METAMUCIL Take 1 packet by mouth daily.   senna-docusate 8.6-50 MG tablet Commonly known as: Senokot-S Take 1 tablet by mouth at bedtime as needed for mild constipation. What changed: when to take this   simvastatin 10 MG tablet Commonly known as: ZOCOR TAKE 1 TABLET BY MOUTH  DAILY   UNABLE TO FIND Med Name: CBD Oil 750mg  1 QD   vitamin B-12 500 MCG tablet Commonly known as: CYANOCOBALAMIN Take 1 tablet (500 mcg total) by mouth daily.   Vitamin D3 25 MCG (1000 UT) Caps Take 1 capsule (1,000 Units total) by mouth daily.       Allergies:  Allergies  Allergen Reactions  . Neurontin [Gabapentin] Photosensitivity, Rash and Other (See Comments)    Blurry vision, off balance   . Niacin And Related Hives    Family History: Family History  Problem Relation Age of Onset  . Stroke Father 48       hemorrhagic bleed  . Hypertension Father   . Cancer Mother 33       breast  . Breast cancer Mother 35  . Cancer Brother 34       lung (nonsmoker)  . Breast cancer Paternal Grandmother   . CAD Neg Hx   . Diabetes Neg Hx     Social History:  reports that she has never smoked. She has never used smokeless tobacco. She reports that she does not drink  alcohol or use drugs.  ROS: UROLOGY Frequent Urination?: No Hard to postpone urination?: No Burning/pain with urination?: No Get up at night to urinate?: Yes Leakage of urine?: Yes Urine stream starts and stops?: No Trouble starting stream?: No Do you have to strain to urinate?: No Blood in urine?: No Urinary tract infection?: No Sexually transmitted disease?: No Injury to kidneys or bladder?: No Painful intercourse?: No Weak stream?: No Currently pregnant?: No Vaginal bleeding?: No Last menstrual period?: n  Gastrointestinal Nausea?: No Vomiting?: No Indigestion/heartburn?: No Diarrhea?: No Constipation?: No  Constitutional Fever: No Night sweats?: No Weight loss?: No Fatigue?:  No  Skin Skin rash/lesions?: No Itching?: No  Eyes Blurred vision?: No Double vision?: No  Ears/Nose/Throat Sore throat?: No Sinus problems?: No  Hematologic/Lymphatic Swollen glands?: No Easy bruising?: No  Cardiovascular Leg swelling?: No Chest pain?: No  Respiratory Cough?: No Shortness of breath?: No  Endocrine Excessive thirst?: No  Musculoskeletal Back pain?: Yes Joint pain?: Yes  Neurological Headaches?: No Dizziness?: No  Psychologic Depression?: No Anxiety?: No  Physical Exam: BP 122/80   Pulse 74   Ht 4\' 11"  (1.499 m)   Wt 119 lb (54 kg)   BMI 24.04 kg/m   Constitutional:  Alert and oriented, No acute distress. HEENT: [place] AT, moist mucus membranes.  Trachea midline, no masses. Cardiovascular: No clubbing, cyanosis, or edema. Respiratory: Normal respiratory effort, no increased work of breathing. Skin: No rashes, bruises or suspicious lesions. Neurologic: Grossly intact, no focal deficits, moving all 4 extremities. Psychiatric: Normal mood and affect.   Assessment & Plan:    - Personal history urinary calculi No evidence recurrent stones.  Follow-up 1 year with KUB  - Overactive bladder No improvement with oxybutynin and Myrbetriq was  effective however has become cost prohibitive.  Trial of Toviaz 8 mg daily-samples given.  She will call back regarding efficacy.  If no improvement recommend appointment with Dr. Matilde Sprang for a second opinion.   Abbie Sons, [place] [address], [place], [place] [address] [phone]

## 2019-08-03 DIAGNOSIS — H401113 Primary open-angle glaucoma, right eye, severe stage: Secondary | ICD-10-CM | POA: Diagnosis not present

## 2019-08-15 ENCOUNTER — Telehealth: Payer: Self-pay

## 2019-08-15 ENCOUNTER — Ambulatory Visit (INDEPENDENT_AMBULATORY_CARE_PROVIDER_SITE_OTHER): Payer: Medicare Other | Admitting: Family Medicine

## 2019-08-15 ENCOUNTER — Encounter: Payer: Self-pay | Admitting: Family Medicine

## 2019-08-15 DIAGNOSIS — Z20828 Contact with and (suspected) exposure to other viral communicable diseases: Secondary | ICD-10-CM | POA: Diagnosis not present

## 2019-08-15 DIAGNOSIS — Z20822 Contact with and (suspected) exposure to covid-19: Secondary | ICD-10-CM | POA: Insufficient documentation

## 2019-08-15 NOTE — Telephone Encounter (Signed)
I will see her then  

## 2019-08-15 NOTE — Patient Instructions (Signed)
Go ahead and get tested for covid (at a [facility] testing center or pharmacy)  Use the info/numbers I gave you to make an appointment to get tested tomorrow or Friday (given time since exposure) and call us if any problems  Isolate yourself until a result  Results should flow into mychart if you are tested within [facility]  Please call if any new symptoms develop (fever/chills/cough/congestion/ GI symptoms or loss of taste and smell)  Also if your headache worsens Drink fluids and rest

## 2019-08-15 NOTE — Assessment & Plan Note (Signed)
Possible exposure to covid 19 virus at work on 08/11/19 (was masked) Pt has mild headache (she thinks is a stress headache)  No other symptoms  Recommend she get tested for covid 5 or more days after her potential exposure and isolate until result returns Also discussed symptoms to watch for including uri/GI/fever/or changes in taste/smell She voiced understanding  Info given re: making appt to get tested through the [facility]  Will watch for results

## 2019-08-15 NOTE — Progress Notes (Signed)
Virtual Visit via Video Note  I connected with [name] on 08/15/19 at  3:45 PM EST by a video enabled telemedicine application and verified that I am speaking with the correct person using two identifiers.  Location: Patient: home Provider: office    I discussed the limitations of evaluation and management by telemedicine and the availability of in person appointments. The patient expressed understanding and agreed to proceed.  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 Exposure to COVID-19 virus 08/15/2019  . Personal history of urinary calculi 07/31/2019  . Overactive bladder 07/31/2019  . Urge incontinence 07/31/2019  . Left carotid bruit 02/10/2019  . GERD (gastroesophageal reflux disease) 10/31/2018  . Primary open angle glaucoma (POAG) of right eye, severe stage 04/30/2018  . Osteopenia 11/20/2017  . Weight loss 08/02/2017  . Acquired hammer toe of right foot 01/29/2017  . Left leg swelling 01/29/2017  . B12 deficiency 07/31/2016  . Chronic constipation 04/21/2016  . Syncope 07/09/2015  . Medicare annual wellness visit, subsequent 04/19/2015  . Health maintenance examination 04/19/2015  . Advanced care planning/counseling discussion 04/19/2015  . Prediabetes   . Allergic rhinitis due  to pollen   . IDA (iron deficiency anemia)   . Glaucoma   . HTN (hypertension)   . HLD (hyperlipidemia)   . DDD (degenerative disc disease), lumbar   . DJD (degenerative joint disease) of knee   . Lumbar stenosis with neurogenic claudication 08/31/2014   Past Medical History:  Diagnosis Date  . Allergic rhinitis due to pollen   . Cataract   . Complication of anesthesia 10/2017   hypotensive episode after cysto requiring ICU stay  . DDD (degenerative disc disease), lumbar    with spinal stenosis and neurogenic claudication s/p ESI (Chasnis)  . DJD (degenerative joint disease) of knee    failed steroid and synvisc injections  . E. coli UTI 11/05/2017  . Estrogen deficiency   . GERD (gastroesophageal reflux disease)   . Glaucoma   . History of chicken pox   . History of kidney stones   . History of stomach ulcers 2010s  . HLD (hyperlipidemia)   . HTN (hypertension)   . IDA (iron deficiency anemia) 2012   s/p normal EGD/colonoscopy (iftikhar)  . 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 Neuromuscular disorder (HCC)    sometimes fingers get numb  . Prediabetes   . Tricuspid regurgitation 2012   by echo, EF 73% [place] Flakes)   Past Surgical History:  Procedure Laterality Date  . APPENDECTOMY    . COLONOSCOPY  08/2011  diverticulosis, int hem (Iftikhar)  . CYSTOSCOPY W/ RETROGRADES Left 10/29/2017   Procedure: CYSTOSCOPY WITH RETROGRADE PYELOGRAM;  Surgeon: Riki AltesStoioff, Scott C, MD;  Location: [facility];  Service: Urology;  Laterality: Left;  . CYSTOSCOPY W/ URETERAL STENT PLACEMENT Left 12/03/2017   Procedure: CYSTOSCOPY WITH STENT REPLACEMENT;  Surgeon: Riki AltesStoioff, Scott C, MD;  Location: [facility];  Service: Urology;  Laterality: Left;  . CYSTOSCOPY WITH STENT PLACEMENT Left 10/29/2017   Procedure: CYSTOSCOPY WITH STENT PLACEMENT;  Surgeon: Riki AltesStoioff, Scott C, MD;  Location: [facility];  Service:  Urology;  Laterality: Left;  . CYSTOSCOPY WITH URETEROSCOPY Left 12/03/2017   Procedure: CYSTOSCOPY WITH URETEROSCOPY;  Surgeon: Riki AltesStoioff, Scott C, MD;  Location: [facility];  Service: Urology;  Laterality: Left;  . ESI  multiple, latest 03/2015   R L5/S1, L L3/4, L L4/5 L5/S1, L L4/5 and L5/S1 - minimal relief (Chasnis)  . ESOPHAGOGASTRODUODENOSCOPY  08/2011   WNL  . EYE SURGERY Bilateral    laser eye surgery for glaucoma  . FOOT SURGERY Right   . TOE SURGERY Right 2005   titanium for toe replacement  . VAGINAL HYSTERECTOMY  1980s   prolapsed uterus, ovaries remained   Social History   Tobacco Use  . Smoking status: Never Smoker  . Smokeless tobacco: Never Used  Substance Use Topics  . Alcohol use: No    Alcohol/week: 0.0 standard drinks  . Drug use: No   Family History  Problem Relation Age of Onset  . Stroke Father 48       hemorrhagic bleed  . Hypertension Father   . Cancer Mother [id number]       breast  . Breast cancer Mother [phone]  . Cancer Brother [id number]       lung (nonsmoker)  . Breast cancer Paternal Grandmother   . CAD Neg Hx   . Diabetes Neg Hx    Allergies  Allergen Reactions  . Neurontin [Gabapentin] Photosensitivity, Rash and Other (See Comments)    Blurry vision, off balance   . Niacin And Related Hives   Current Outpatient Medications on File Prior to Visit  Medication Sig Dispense Refill  . Ascorbic Acid (VITAMIN C PO) Take 5,000 mg by mouth.    . Calcium Citrate 333 MG TABS Take 2 tablets by mouth 2 (two) times daily.    . cetirizine (ZYRTEC) 10 MG tablet Take 10 mg by mouth daily.    . Cholecalciferol (VITAMIN D3) 25 MCG (1000 UT) CAPS Take 1 capsule (1,000 Units total) by mouth daily. 30 capsule   . famotidine (PEPCID) 20 MG tablet Take 1 tablet (20 mg total) by mouth at bedtime. (Patient taking differently: Take 20 mg by mouth daily as needed. ) 30 tablet 3  . ferrous sulfate 325 (65 FE) MG tablet Take 1 tablet (325 mg total) by mouth every other day.  3  .  fesoterodine (TOVIAZ) 8 MG TB24 tablet Take 8 mg by mouth daily.    . hydrochlorothiazide (MICROZIDE) 12.5 MG capsule TAKE 1 CAPSULE BY MOUTH ONCE DAILY AS NEEDED FOR  LEG  SWELLING 90 capsule 2  . HYDROcodone-acetaminophen (NORCO) 7.5-325 MG per tablet Take 1 tablet by mouth 3 (three) times daily as needed for moderate pain. (Dr Yves Dillhasnis)    . ibuprofen (ADVIL,MOTRIN) 200 MG tablet Take 200 mg by mouth every 6 (six) hours as needed.     Marland Kitchen. lisinopril (PRINIVIL,ZESTRIL) 10 MG tablet Take 1 tablet (10 mg total) by mouth daily. 90 tablet 3  . Magnesium ([employer]  TRIPLE MAGNESIUM COMPLEX) 400 MG CAPS Take 2 capsules by mouth 2 (two) times daily.    . Omega-3 Fatty Acids (FISH OIL) 1200 MG CAPS Take 1 capsule by mouth daily.     . psyllium (METAMUCIL) 58.6 % powder Take 1 packet by mouth daily.    Marland Kitchen senna-docusate (SENOKOT-S) 8.6-50 MG tablet Take 1 tablet by mouth at bedtime as needed for mild constipation. (Patient taking differently: Take 1 tablet by mouth at bedtime. ) 30 tablet 0  . simvastatin (ZOCOR) 10 MG tablet TAKE 1 TABLET BY MOUTH  DAILY 90 tablet 1  . vitamin B-12 (CYANOCOBALAMIN) 500 MCG tablet Take 1 tablet (500 mcg total) by mouth daily.    . Zinc 50 MG TABS Take by mouth.     No current facility-administered medications on file prior to visit.     Review of Systems  Constitutional: Negative for chills, fever and malaise/fatigue.  HENT: Negative for congestion, ear pain, sinus pain and sore throat.   Eyes: Negative for blurred vision, discharge and redness.  Respiratory: Negative for cough, shortness of breath and stridor.   Cardiovascular: Negative for chest pain, palpitations and leg swelling.  Gastrointestinal: Negative for abdominal pain, diarrhea, nausea and vomiting.  Musculoskeletal: Negative for myalgias.  Skin: Negative for rash.  Neurological: Positive for headaches. Negative for dizziness.    Observations/Objective: Patient appears well, in no distress  (saw pt at start of  the visit), had to talk on the phone separately due to audio issues  Weight is baseline  No facial swelling or asymmetry Normal voice-not hoarse and no slurred speech No obvious tremor or mobility impairment Moving neck and UEs normally Able to hear the call well  No cough or shortness of breath during interview  Talkative and mentally sharp with no cognitive changes No skin changes on face or neck , no rash or pallor Affect is normal    Assessment and Plan: Problem List Items Addressed This Visit      Other   Exposure to COVID-19 virus    Possible exposure to covid 19 virus at work on 08/11/19 (was masked) Pt has mild headache (she thinks is a stress headache)  No other symptoms  Recommend she get tested for covid 5 or more days after her potential exposure and isolate until result returns Also discussed symptoms to watch for including uri/GI/fever/or changes in taste/smell She voiced understanding  Info given re: making appt to get tested through [facility]  Will watch for results          Follow Up Instructions: Go ahead and get tested for covid (at a [facility] testing center or pharmacy)  Use the info/numbers I gave you to make an appointment to get tested tomorrow or Friday (given time since exposure) and call us if any problems  Isolate yourself until a result  Results should flow into mychart if you are tested within [facility]  Please call if any new symptoms develop (fever/chills/cough/congestion/ GI symptoms or loss of taste and smell)  Also if your headache worsens Drink fluids and rest    I discussed the assessment and treatment plan with the patient. The patient was provided an opportunity to ask questions and all were answered. The patient agreed with the plan and demonstrated an understanding of the instructions.   The patient was advised to call back or seek an in-person evaluation if the symptoms worsen or if the condition fails to improve as  anticipated.     [employer]  Glori Bickers, MD

## 2019-08-15 NOTE — Telephone Encounter (Signed)
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 Pt usually wears a mask at work. Pt wants to get a covid test. Pt has H/A. Pt has no covid symptoms except H/A.pt scheduled virtual appt with Dr Glori Bickers today at 3:45. Pt will have vitals ready when CMA calls.

## 2019-08-18 ENCOUNTER — Other Ambulatory Visit: Payer: Self-pay

## 2019-08-18 ENCOUNTER — Ambulatory Visit: Payer: Medicare Other | Attending: Internal Medicine

## 2019-08-18 DIAGNOSIS — Z20822 Contact with and (suspected) exposure to covid-19: Secondary | ICD-10-CM

## 2019-08-19 ENCOUNTER — Other Ambulatory Visit: Payer: Self-pay | Admitting: Family Medicine

## 2019-08-19 LAB — NOVEL CORONAVIRUS, NAA: SARS-CoV-2, NAA: NOT DETECTED

## 2019-08-28 DIAGNOSIS — I10 Essential (primary) hypertension: Secondary | ICD-10-CM | POA: Diagnosis not present

## 2019-08-28 DIAGNOSIS — H2512 Age-related nuclear cataract, left eye: Secondary | ICD-10-CM | POA: Diagnosis not present

## 2019-08-29 ENCOUNTER — Encounter: Payer: Self-pay | Admitting: Ophthalmology

## 2019-08-29 ENCOUNTER — Other Ambulatory Visit: Payer: Self-pay

## 2019-08-30 ENCOUNTER — Telehealth: Payer: Self-pay | Admitting: Urology

## 2019-08-30 DIAGNOSIS — R350 Frequency of micturition: Secondary | ICD-10-CM

## 2019-08-30 DIAGNOSIS — N3281 Overactive bladder: Secondary | ICD-10-CM

## 2019-08-30 DIAGNOSIS — N3941 Urge incontinence: Secondary | ICD-10-CM

## 2019-08-30 MED ORDER — OXYBUTYNIN CHLORIDE 5 MG PO TABS: 5.0000 mg | ORAL_TABLET | Freq: Three times a day (TID) | ORAL | 11 refills | Status: DC

## 2019-08-30 NOTE — Addendum Note (Signed)
Addended by: Donalee Citrin on: 08/30/2019 01:53 PM   Modules accepted: Orders

## 2019-08-30 NOTE — Telephone Encounter (Signed)
Pt LMOM and requests a call back to discuss an alternative to [place].

## 2019-08-30 NOTE — Telephone Encounter (Signed)
Pt returned miss call, I read the message to the pt. Pt states it's not that toviaz isn't working, the issue is that her insurance will not cover it and would like to know if she can get oxybutinin, pt called her insurance comp and of the 3 medications discussed at her appt, that's the only one they will cover. Pt states she has taken this before about a yr ago, and it worked. Please advise.

## 2019-08-30 NOTE — Telephone Encounter (Signed)
Called pt informed her that she previously said oxybutynin was ineffective. Patient now states that she cannot remember if it was effective however does remember having dry mouth with the medication. Pt states that her insurance will not cover Toviaz or Myrbetriq so she would like to try oxybutynin again. RX sent. Advised pt to call back with regards to efficacy.

## 2019-08-30 NOTE — Telephone Encounter (Signed)
Called pt, no answer. Left detailed message informing pt that if Lisbeth Ply is not working for her she needs to be set up to see Dr. Matilde Sprang per Bernardo Heater.

## 2019-08-31 DIAGNOSIS — M5126 Other intervertebral disc displacement, lumbar region: Secondary | ICD-10-CM | POA: Diagnosis not present

## 2019-08-31 DIAGNOSIS — M5416 Radiculopathy, lumbar region: Secondary | ICD-10-CM | POA: Diagnosis not present

## 2019-08-31 DIAGNOSIS — M48062 Spinal stenosis, lumbar region with neurogenic claudication: Secondary | ICD-10-CM | POA: Diagnosis not present

## 2019-09-04 ENCOUNTER — Other Ambulatory Visit
Admission: RE | Admit: 2019-09-04 | Discharge: 2019-09-04 | Disposition: A | Discharge status: Home / Self Care | Payer: Medicare Other | Source: Ambulatory Visit | Attending: Ophthalmology | Admitting: Ophthalmology

## 2019-09-04 DIAGNOSIS — Z01812 Encounter for preprocedural laboratory examination: Secondary | ICD-10-CM | POA: Diagnosis not present

## 2019-09-04 DIAGNOSIS — Z20822 Contact with and (suspected) exposure to covid-19: Secondary | ICD-10-CM | POA: Insufficient documentation

## 2019-09-04 LAB — SARS CORONAVIRUS 2 (TAT 6-24 HRS): SARS Coronavirus 2: NEGATIVE

## 2019-09-04 NOTE — Discharge Instructions (Signed)

## 2019-09-06 ENCOUNTER — Encounter
Admission: RE | Disposition: A | Discharge status: Home / Self Care | Payer: Self-pay | Source: Home / Self Care | Attending: Ophthalmology

## 2019-09-06 ENCOUNTER — Ambulatory Visit: Payer: Medicare Other | Admitting: Anesthesiology

## 2019-09-06 ENCOUNTER — Other Ambulatory Visit: Payer: Self-pay

## 2019-09-06 ENCOUNTER — Ambulatory Visit: Admit: 2019-09-06 | Payer: Medicare Other | Admitting: Ophthalmology

## 2019-09-06 ENCOUNTER — Ambulatory Visit
Admission: RE | Admit: 2019-09-06 | Discharge: 2019-09-06 | Disposition: A | Discharge status: Home / Self Care | Payer: Medicare Other | Attending: Ophthalmology | Admitting: Ophthalmology

## 2019-09-06 DIAGNOSIS — E785 Hyperlipidemia, unspecified: Secondary | ICD-10-CM | POA: Insufficient documentation

## 2019-09-06 DIAGNOSIS — M1710 Unilateral primary osteoarthritis, unspecified knee: Secondary | ICD-10-CM | POA: Insufficient documentation

## 2019-09-06 DIAGNOSIS — H401121 Primary open-angle glaucoma, left eye, mild stage: Secondary | ICD-10-CM | POA: Diagnosis not present

## 2019-09-06 DIAGNOSIS — Z79899 Other long term (current) drug therapy: Secondary | ICD-10-CM | POA: Diagnosis not present

## 2019-09-06 DIAGNOSIS — Z87891 Personal history of nicotine dependence: Secondary | ICD-10-CM | POA: Diagnosis not present

## 2019-09-06 DIAGNOSIS — M5136 Other intervertebral disc degeneration, lumbar region: Secondary | ICD-10-CM | POA: Diagnosis not present

## 2019-09-06 DIAGNOSIS — H2512 Age-related nuclear cataract, left eye: Secondary | ICD-10-CM | POA: Diagnosis not present

## 2019-09-06 DIAGNOSIS — M1990 Unspecified osteoarthritis, unspecified site: Secondary | ICD-10-CM | POA: Insufficient documentation

## 2019-09-06 DIAGNOSIS — H25812 Combined forms of age-related cataract, left eye: Secondary | ICD-10-CM | POA: Diagnosis not present

## 2019-09-06 DIAGNOSIS — J449 Chronic obstructive pulmonary disease, unspecified: Secondary | ICD-10-CM | POA: Diagnosis not present

## 2019-09-06 DIAGNOSIS — R7303 Prediabetes: Secondary | ICD-10-CM | POA: Insufficient documentation

## 2019-09-06 DIAGNOSIS — K219 Gastro-esophageal reflux disease without esophagitis: Secondary | ICD-10-CM | POA: Insufficient documentation

## 2019-09-06 DIAGNOSIS — M48062 Spinal stenosis, lumbar region with neurogenic claudication: Secondary | ICD-10-CM | POA: Diagnosis not present

## 2019-09-06 DIAGNOSIS — I10 Essential (primary) hypertension: Secondary | ICD-10-CM | POA: Diagnosis not present

## 2019-09-06 HISTORY — PX: CATARACT EXTRACTION W/PHACO: SHX586

## 2019-09-06 SURGERY — PHACOEMULSIFICATION, CATARACT, WITH IOL INSERTION
Anesthesia: Topical | Laterality: Left

## 2019-09-06 SURGERY — PHACOEMULSIFICATION, CATARACT, WITH IOL INSERTION
Anesthesia: Monitor Anesthesia Care | Site: Eye | Laterality: Left

## 2019-09-06 MED ORDER — ARMC OPHTHALMIC DILATING DROPS
1.0000 "application " | OPHTHALMIC | Status: DC | PRN
  Administered 2019-09-06 (×3): 1 via OPHTHALMIC

## 2019-09-06 MED ORDER — ACETAMINOPHEN 160 MG/5ML PO SOLN: 325.0000 mg | ORAL | Status: DC | PRN

## 2019-09-06 MED ORDER — NA HYALUR & NA CHOND-NA HYALUR 0.4-0.35 ML IO KIT
PACK | INTRAOCULAR | Status: DC | PRN
  Administered 2019-09-06: 1 mL via INTRAOCULAR

## 2019-09-06 MED ORDER — TETRACAINE HCL 0.5 % OP SOLN
1.0000 [drp] | OPHTHALMIC | Status: DC | PRN
  Administered 2019-09-06 (×3): 1 [drp] via OPHTHALMIC

## 2019-09-06 MED ORDER — CEFUROXIME OPHTHALMIC INJECTION 1 MG/0.1 ML
INJECTION | OPHTHALMIC | Status: DC | PRN
  Administered 2019-09-06: 0.1 mL via INTRACAMERAL

## 2019-09-06 MED ORDER — ACETAMINOPHEN 325 MG PO TABS: 325.0000 mg | ORAL_TABLET | ORAL | Status: DC | PRN

## 2019-09-06 MED ORDER — ONDANSETRON HCL 4 MG/2ML IJ SOLN: 4.0000 mg | Freq: Once | INTRAMUSCULAR | Status: DC | PRN

## 2019-09-06 MED ORDER — FENTANYL CITRATE (PF) 100 MCG/2ML IJ SOLN
INTRAMUSCULAR | Status: DC | PRN
  Administered 2019-09-06 (×2): 50 ug via INTRAVENOUS

## 2019-09-06 MED ORDER — MOXIFLOXACIN HCL 0.5 % OP SOLN
1.0000 [drp] | OPHTHALMIC | Status: DC | PRN
  Administered 2019-09-06 (×3): 1 [drp] via OPHTHALMIC

## 2019-09-06 MED ORDER — TRYPAN BLUE 0.06 % OP SOLN
OPHTHALMIC | Status: DC | PRN
  Administered 2019-09-06: 0.5 mL via INTRAOCULAR

## 2019-09-06 MED ORDER — EPINEPHRINE PF 1 MG/ML IJ SOLN
INTRAOCULAR | Status: DC | PRN
  Administered 2019-09-06: 48 mL via OPHTHALMIC

## 2019-09-06 MED ORDER — MIDAZOLAM HCL 2 MG/2ML IJ SOLN
INTRAMUSCULAR | Status: DC | PRN
  Administered 2019-09-06: 2 mg via INTRAVENOUS

## 2019-09-06 MED ORDER — LIDOCAINE HCL (PF) 2 % IJ SOLN
INTRAOCULAR | Status: DC | PRN
  Administered 2019-09-06: 2 mL

## 2019-09-06 SURGICAL SUPPLY — 21 items
BLADE DUAL KAHOOK SINGLE USE (BLADE) ×1 IMPLANT
CANNULA ANT/CHMB 27G (MISCELLANEOUS) ×1 IMPLANT
CANNULA ANT/CHMB 27GA (MISCELLANEOUS) ×2 IMPLANT
GLOVE SURG LX 7.5 STRW (GLOVE) ×1
GLOVE SURG LX STRL 7.5 STRW (GLOVE) ×1 IMPLANT
GLOVE SURG TRIUMPH 8.0 PF LTX (GLOVE) ×2 IMPLANT
GOWN STRL REUS W/ TWL LRG LVL3 (GOWN DISPOSABLE) ×2 IMPLANT
GOWN STRL REUS W/TWL LRG LVL3 (GOWN DISPOSABLE) ×2
ICLIP (OPHTHALMIC RELATED) ×1 IMPLANT
LENS IOL TECNIS ITEC 27.0 (Intraocular Lens) ×1 IMPLANT
MARKER SKIN DUAL TIP RULER LAB (MISCELLANEOUS) ×2 IMPLANT
NDL FILTER BLUNT 18X1 1/2 (NEEDLE) IMPLANT
NEEDLE FILTER BLUNT 18X 1/2SAF (NEEDLE) ×2
NEEDLE FILTER BLUNT 18X1 1/2 (NEEDLE) ×2 IMPLANT
PACK CATARACT BRASINGTON (MISCELLANEOUS) ×2 IMPLANT
PACK EYE AFTER SURG (MISCELLANEOUS) ×2 IMPLANT
PACK OPTHALMIC (MISCELLANEOUS) ×2 IMPLANT
SYR 3ML LL SCALE MARK (SYRINGE) ×2 IMPLANT
SYR TB 1ML LUER SLIP (SYRINGE) ×2 IMPLANT
WATER STERILE IRR 500ML POUR (IV SOLUTION) ×2 IMPLANT
WIPE NON LINTING 3.25X3.25 (MISCELLANEOUS) ×2 IMPLANT

## 2019-09-06 NOTE — Anesthesia Preprocedure Evaluation (Signed)
Anesthesia Evaluation  Patient identified by MRN, date of birth, ID band Patient awake    Reviewed: Allergy & Precautions, NPO status , Patient's Chart, lab work & pertinent test results  History of Anesthesia Complications (+) history of anesthetic complications  Airway Mallampati: II  TM Distance: >3 FB Neck ROM: Full    Dental no notable dental hx.    Pulmonary neg pulmonary ROS, neg sleep apnea, neg COPD, Patient abstained from smoking.Not current smoker,    Pulmonary exam normal breath sounds clear to auscultation       Cardiovascular Exercise Tolerance: Good hypertension, Pt. on medications (-) Past MI and (-) CHF Normal cardiovascular exam(-) dysrhythmias (-) Valvular Problems/Murmurs Rhythm:Regular Rate:Normal     Neuro/Psych neg Seizures  Neuromuscular disease negative psych ROS   GI/Hepatic Neg liver ROS, GERD  Medicated and Controlled,  Endo/Other  neg diabetes  Renal/GU negative Renal ROS     Musculoskeletal  (+) Arthritis ,   Abdominal Normal abdominal exam  (+) - obese,  Abdomen: soft.    Peds  Hematology  (+) anemia ,   Anesthesia Other Findings Past Medical History: No date: Allergic rhinitis due to pollen 32/6712: Complication of anesthesia     Comment:  hypotensive episode after cysto requiring ICU stay No date: DDD (degenerative disc disease), lumbar     Comment:  with spinal stenosis and neurogenic claudication s/p ESI              (Chasnis) No date: DJD (degenerative joint disease) of knee     Comment:  failed steroid and synvisc injections No date: Estrogen deficiency No date: GERD (gastroesophageal reflux disease) No date: Glaucoma No date: History of chicken pox No date: History of kidney stones 2010s: History of stomach ulcers No date: HLD (hyperlipidemia) No date: HTN (hypertension) 2012: IDA (iron deficiency anemia)     Comment:  s/p normal EGD/colonoscopy (iftikhar) 08/2014:  Lumbar stenosis with neurogenic claudication     Comment:  by MRI - spinal and foraminal with radiculopathy ([place]              --> Chasnis --> Jenkins) No date: Neuromuscular disorder ([place])     Comment:  sometimes fingers get numb No date: Prediabetes 2012: Tricuspid regurgitation     Comment:  by echo, EF 73% Humphrey Rolls)  Reproductive/Obstetrics                             Anesthesia Physical  Anesthesia Plan  ASA: III  Anesthesia Plan: MAC   Post-op Pain Management:    Induction: Intravenous  PONV Risk Score and Plan: 2 and Treatment may vary due to age or medical condition  Airway Management Planned: Natural Airway  Additional Equipment:   Intra-op Plan:   Post-operative Plan:   Informed Consent: I have reviewed the patients History and Physical, chart, labs and discussed the procedure including the risks, benefits and alternatives for the proposed anesthesia with the patient or authorized representative who has indicated his/her understanding and acceptance.     Dental advisory given  Plan Discussed with: CRNA and Anesthesiologist  Anesthesia Plan Comments:         Anesthesia Quick Evaluation  Patient Active Problem List   Diagnosis Date Noted  . Exposure to COVID-19 virus 08/15/2019  . Personal history of urinary calculi 07/31/2019  . Overactive bladder 07/31/2019  . Urge incontinence 07/31/2019  . Left carotid bruit 02/10/2019  . GERD (gastroesophageal reflux disease) 10/31/2018  .  Primary open angle glaucoma (POAG) of right eye, severe stage 04/30/2018  . Osteopenia 11/20/2017  . Weight loss 08/02/2017  . Acquired hammer toe of right foot 01/29/2017  . Left leg swelling 01/29/2017  . B12 deficiency 07/31/2016  . Chronic constipation 04/21/2016  . Syncope 07/09/2015  . Medicare annual wellness visit, subsequent 04/19/2015  . Health maintenance examination 04/19/2015  . Advanced care planning/counseling discussion 04/19/2015   . Prediabetes   . Allergic rhinitis due to pollen   . IDA (iron deficiency anemia)   . Glaucoma   . HTN (hypertension)   . HLD (hyperlipidemia)   . DDD (degenerative disc disease), lumbar   . DJD (degenerative joint disease) of knee   . Lumbar stenosis with neurogenic claudication 08/31/2014    CBC Latest Ref Rng & Units 10/10/2018 11/26/2017 10/31/2017  WBC 4.0 - 10.5 K/uL 5.6 9.1 26.1(H)  Hemoglobin 12.0 - 15.0 g/dL 11.3(L) 11.4(L) 9.8(L)  Hematocrit 36.0 - 46.0 % 33.7(L) 35.1 29.8(L)  Platelets 150.0 - 400.0 K/uL 315.0 377 147(L)   BMP Latest Ref Rng & Units 10/10/2018 11/26/2017 10/31/2017  Glucose 70 - 99 mg/dL 82 101(H) 90  BUN 6 - 23 mg/dL 19 26(H) 19  Creatinine 0.40 - 1.20 mg/dL 0.64 0.73 0.62  Sodium 135 - 145 mEq/L 142 136 143  Potassium 3.5 - 5.1 mEq/L 4.1 3.5 4.1  Chloride 96 - 112 mEq/L 109 105 116(H)  CO2 19 - 32 mEq/L 24 22 19(L)  Calcium 8.4 - 10.5 mg/dL 9.4 9.0 8.2(L)    Risks and benefits of anesthesia discussed at length, patient or surrogate demonstrates understanding. Appropriately NPO. Plan to proceed with anesthesia.  Champ Mungo, MD 09/06/19

## 2019-09-06 NOTE — Anesthesia Postprocedure Evaluation (Signed)
Anesthesia Post Note  Patient: Brandy Young  Procedure(s) Performed: CATARACT EXTRACTION PHACO AND INTRAOCULAR LENS PLACEMENT (IOC) LEFT [place] DUAL BLADE GONIOTOMY 5.67,     00:46.4,     30.5% (Left Eye)     Patient location during evaluation: Phase II Anesthesia Type: MAC Level of consciousness: awake and alert and patient cooperative Pain management: pain level controlled Vital Signs Assessment: post-procedure vital signs reviewed and stable Respiratory status: spontaneous breathing, nonlabored ventilation and respiratory function stable Cardiovascular status: blood pressure returned to baseline and stable Postop Assessment: no headache and no backache Anesthetic complications: no    Sinda Du

## 2019-09-06 NOTE — Anesthesia Procedure Notes (Signed)
Procedure Name: MAC Date/Time: 09/06/2019 1:26 PM Performed by: Jeannene Patella, CRNA Pre-anesthesia Checklist: Patient identified, Emergency Drugs available, Suction available, Patient being monitored and Timeout performed Patient Re-evaluated:Patient Re-evaluated prior to induction Oxygen Delivery Method: Nasal cannula

## 2019-09-06 NOTE — Transfer of Care (Signed)
Immediate Anesthesia Transfer of Care Note  Patient: Brandy Young  Procedure(s) Performed: CATARACT EXTRACTION PHACO AND INTRAOCULAR LENS PLACEMENT (IOC) LEFT [place] DUAL BLADE GONIOTOMY 5.67,     00:46.4,     30.5% (Left Eye)  Patient Location: PACU  Anesthesia Type: MAC  Level of Consciousness: awake, alert  and patient cooperative  Airway and Oxygen Therapy: Patient Spontanous Breathing and Patient connected to supplemental oxygen  Post-op Assessment: Post-op Vital signs reviewed, Patient's Cardiovascular Status Stable, Respiratory Function Stable, Patent Airway and No signs of Nausea or vomiting  Post-op Vital Signs: Reviewed and stable  Complications: No apparent anesthesia complications

## 2019-09-06 NOTE — H&P (Signed)

## 2019-09-06 NOTE — Op Note (Signed)
PREOPERATIVE DIAGNOSIS:  Nuclear sclerotic cataract left eye. H25.12  mild stage Primary Open Angle Glaucoma left eye H40.1121  POSTOPERATIVE DIAGNOSIS:    Nuclear sclerotic cataract left eye.     mild stage Primary Open Angle Glaucoma left eye H40.1121  PROCEDURE:  Phacoemusification with posterior chamber intraocular lens placement of the left eye  [place] Dual Blade goniotomy left eye  Ultrasound time: Procedure(s) with comments: CATARACT EXTRACTION PHACO AND INTRAOCULAR LENS PLACEMENT (IOC) LEFT [place] DUAL BLADE GONIOTOMY 5.67,     00:46.4,     30.5% (Left) - requests arrival 9:30 or after  LENS:  Implant Name Type Inv. Item Serial No. Manufacturer Lot No. LRB No. Used Action  LENS IOL DIOP 27.0 - [id number] Intraocular Lens LENS IOL DIOP 27.0 [id number]  Left 1 Implanted    SURGEON:  Deirdre Evener, MD   ANESTHESIA:  Topical with tetracaine drops augmented with 1% preservative-free intracameral lidocaine.    COMPLICATIONS:  None.   DESCRIPTION OF PROCEDURE:  The patient was identified in the holding room and transported to the operating room and placed in the supine position under the operating microscope.  The left eye was identified as the operative eye and it was prepped and draped in the usual sterile ophthalmic fashion.   A 1 millimeter clear-corneal paracentesis was made at the 5:30 position.  0.5 ml of preservative-free 1% lidocaine was injected into the anterior chamber.  [place] 5 was placed into the anterior chamber to stain the trabecular meshwork and lens capsule.  The anterior chamber was filled with Viscoat viscoelastic.  A 2.4 millimeter keratome was used to make a near-clear corneal incision at the 2:30 position. The microscope was adjusted and a gonioprism was used to visulaize the trabecular meshwork.  The [facility] Dual Blade was advanced across the anterior chamber under viscoelastic.  The blade was used to mark the trabecular meshwork at the 7:30 position.   The blade was placed two clock hours clockwise into the meshwork.  Proper postioning was confirmed.  The blade ws passed counterclockwise through the meshwork to excise approximately two to three clock-hours of trabecular meshwork.   A curvilinear capsulorrhexis was made with a cystotome and capsulorrhexis forceps.  Balanced salt solution was used to hydrodissect and hydrodelineate the nucleus.   Phacoemulsification was then used in stop and chop fashion to remove the lens nucleus and epinucleus.  The remaining cortex was then removed using the irrigation and aspiration handpiece. Provisc was then placed into the capsular bag to distend it for lens placement.  A lens was then injected into the capsular bag.  The remaining viscoelastic was aspirated.   Wounds were hydrated with balanced salt solution.  The anterior chamber was inflated to a physiologic pressure with balanced salt solution.  No wound leaks were noted. Cefuroxime 0.1 ml of a 10mg /ml solution was injected into the anterior chamber for a dose of 1 mg of intracameral antibiotic at the completion of the case.  The patient was taken to the recovery room in stable condition without complications of anesthesia or surgery. \

## 2019-09-07 ENCOUNTER — Encounter: Payer: Self-pay | Admitting: *Deleted

## 2019-09-21 DIAGNOSIS — H2511 Age-related nuclear cataract, right eye: Secondary | ICD-10-CM | POA: Diagnosis not present

## 2019-09-25 ENCOUNTER — Other Ambulatory Visit: Payer: Self-pay | Admitting: Family Medicine

## 2019-09-26 NOTE — Anesthesia Preprocedure Evaluation (Addendum)
Anesthesia Evaluation  Patient identified by MRN, date of birth, ID band Patient awake    Reviewed: Allergy & Precautions, NPO status , Patient's Chart, lab work & pertinent test results  History of Anesthesia Complications (+) history of anesthetic complications (H/o post-op hypotension requiring ICU admission)  Airway Mallampati: II  TM Distance: >3 FB Neck ROM: Full    Dental no notable dental hx.    Pulmonary neg sleep apnea, neg COPD, Not current smoker,    Pulmonary exam normal breath sounds clear to auscultation       Cardiovascular Exercise Tolerance: Good hypertension, Pt. on medications (-) angina(-) Past MI, (-) CHF and (-) DOE Normal cardiovascular exam(-) dysrhythmias (-) Valvular Problems/Murmurs Rhythm:Regular Rate:Normal   HLD   Neuro/Psych neg Seizures  Neuromuscular disease (Lumbar stenosis)    GI/Hepatic GERD  Medicated and Controlled,  Endo/Other  diabetes (Pre-DM)  Renal/GU Renal disease (Stones)     Musculoskeletal  (+) Arthritis ,   Abdominal Normal abdominal exam  (+) - obese,  Abdomen: soft.    Peds  Hematology  (+) anemia ,   Anesthesia Other Findings   Reproductive/Obstetrics                            Anesthesia Physical  Anesthesia Plan  ASA: III  Anesthesia Plan: MAC   Post-op Pain Management:    Induction: Intravenous  PONV Risk Score and Plan: 2 and Treatment may vary due to age or medical condition  Airway Management Planned: Natural Airway  Additional Equipment:   Intra-op Plan:   Post-operative Plan:   Informed Consent: I have reviewed the patients History and Physical, chart, labs and discussed the procedure including the risks, benefits and alternatives for the proposed anesthesia with the patient or authorized representative who has indicated his/her understanding and acceptance.     Dental advisory given  Plan Discussed with:  CRNA and Anesthesiologist  Anesthesia Plan Comments:         Anesthesia Quick Evaluation  Patient Active Problem List   Diagnosis Date Noted  . Exposure to COVID-19 virus 08/15/2019  . Personal history of urinary calculi 07/31/2019  . Overactive bladder 07/31/2019  . Urge incontinence 07/31/2019  . Left carotid bruit 02/10/2019  . GERD (gastroesophageal reflux disease) 10/31/2018  . Primary open angle glaucoma (POAG) of right eye, severe stage 04/30/2018  . Osteopenia 11/20/2017  . Weight loss 08/02/2017  . Acquired hammer toe of right foot 01/29/2017  . Left leg swelling 01/29/2017  . B12 deficiency 07/31/2016  . Chronic constipation 04/21/2016  . Syncope 07/09/2015  . Medicare annual wellness visit, subsequent 04/19/2015  . Health maintenance examination 04/19/2015  . Advanced care planning/counseling discussion 04/19/2015  . Prediabetes   . Allergic rhinitis due to pollen   . IDA (iron deficiency anemia)   . Glaucoma   . HTN (hypertension)   . HLD (hyperlipidemia)   . DDD (degenerative disc disease), lumbar   . DJD (degenerative joint disease) of knee   . Lumbar stenosis with neurogenic claudication 08/31/2014    CBC Latest Ref Rng & Units 10/10/2018 11/26/2017 10/31/2017  WBC 4.0 - 10.5 K/uL 5.6 9.1 26.1(H)  Hemoglobin 12.0 - 15.0 g/dL 11.3(L) 11.4(L) 9.8(L)  Hematocrit 36.0 - 46.0 % 33.7(L) 35.1 29.8(L)  Platelets 150.0 - 400.0 K/uL 315.0 377 147(L)   BMP Latest Ref Rng & Units 10/10/2018 11/26/2017 10/31/2017  Glucose 70 - 99 mg/dL 82 101(H) 90  BUN 6 - 23  mg/dL 19 26(H) 19  Creatinine 0.40 - 1.20 mg/dL 0.64 0.73 0.62  Sodium 135 - 145 mEq/L 142 136 143  Potassium 3.5 - 5.1 mEq/L 4.1 3.5 4.1  Chloride 96 - 112 mEq/L 109 105 116(H)  CO2 19 - 32 mEq/L 24 22 19(L)  Calcium 8.4 - 10.5 mg/dL 9.4 9.0 8.2(L)    Risks and benefits of anesthesia discussed at length, patient or surrogate demonstrates understanding. Appropriately NPO. Plan to proceed with  anesthesia.  Champ Mungo, MD 09/26/19

## 2019-09-27 ENCOUNTER — Other Ambulatory Visit: Payer: Self-pay

## 2019-09-27 ENCOUNTER — Encounter: Payer: Self-pay | Admitting: Ophthalmology

## 2019-09-29 ENCOUNTER — Other Ambulatory Visit: Payer: Self-pay | Admitting: Urology

## 2019-09-29 DIAGNOSIS — M5126 Other intervertebral disc displacement, lumbar region: Secondary | ICD-10-CM | POA: Diagnosis not present

## 2019-09-29 DIAGNOSIS — M5136 Other intervertebral disc degeneration, lumbar region: Secondary | ICD-10-CM | POA: Diagnosis not present

## 2019-09-29 DIAGNOSIS — M48062 Spinal stenosis, lumbar region with neurogenic claudication: Secondary | ICD-10-CM | POA: Diagnosis not present

## 2019-09-29 DIAGNOSIS — M5416 Radiculopathy, lumbar region: Secondary | ICD-10-CM | POA: Diagnosis not present

## 2019-09-29 DIAGNOSIS — N3941 Urge incontinence: Secondary | ICD-10-CM

## 2019-09-29 DIAGNOSIS — R350 Frequency of micturition: Secondary | ICD-10-CM

## 2019-09-29 DIAGNOSIS — N3281 Overactive bladder: Secondary | ICD-10-CM

## 2019-10-02 ENCOUNTER — Other Ambulatory Visit
Admission: RE | Admit: 2019-10-02 | Discharge: 2019-10-02 | Disposition: A | Discharge status: Home / Self Care | Payer: Medicare Other | Source: Ambulatory Visit | Attending: Ophthalmology | Admitting: Ophthalmology

## 2019-10-02 DIAGNOSIS — Z20822 Contact with and (suspected) exposure to covid-19: Secondary | ICD-10-CM | POA: Diagnosis not present

## 2019-10-02 DIAGNOSIS — Z01812 Encounter for preprocedural laboratory examination: Secondary | ICD-10-CM | POA: Diagnosis not present

## 2019-10-02 NOTE — Discharge Instructions (Signed)

## 2019-10-03 LAB — SARS CORONAVIRUS 2 (TAT 6-24 HRS): SARS Coronavirus 2: NEGATIVE

## 2019-10-04 ENCOUNTER — Other Ambulatory Visit: Payer: Self-pay

## 2019-10-04 ENCOUNTER — Encounter
Admission: RE | Disposition: A | Discharge status: Home / Self Care | Payer: Self-pay | Source: Home / Self Care | Attending: Ophthalmology

## 2019-10-04 ENCOUNTER — Encounter: Payer: Self-pay | Admitting: Ophthalmology

## 2019-10-04 ENCOUNTER — Ambulatory Visit
Admission: RE | Admit: 2019-10-04 | Discharge: 2019-10-04 | Disposition: A | Discharge status: Home / Self Care | Payer: Medicare Other | Attending: Ophthalmology | Admitting: Ophthalmology

## 2019-10-04 ENCOUNTER — Ambulatory Visit: Payer: Medicare Other | Admitting: Anesthesiology

## 2019-10-04 DIAGNOSIS — D649 Anemia, unspecified: Secondary | ICD-10-CM | POA: Insufficient documentation

## 2019-10-04 DIAGNOSIS — Z888 Allergy status to other drugs, medicaments and biological substances status: Secondary | ICD-10-CM | POA: Insufficient documentation

## 2019-10-04 DIAGNOSIS — K219 Gastro-esophageal reflux disease without esophagitis: Secondary | ICD-10-CM | POA: Insufficient documentation

## 2019-10-04 DIAGNOSIS — H25811 Combined forms of age-related cataract, right eye: Secondary | ICD-10-CM | POA: Diagnosis not present

## 2019-10-04 DIAGNOSIS — Z9849 Cataract extraction status, unspecified eye: Secondary | ICD-10-CM | POA: Insufficient documentation

## 2019-10-04 DIAGNOSIS — E7800 Pure hypercholesterolemia, unspecified: Secondary | ICD-10-CM | POA: Insufficient documentation

## 2019-10-04 DIAGNOSIS — Z79899 Other long term (current) drug therapy: Secondary | ICD-10-CM | POA: Diagnosis not present

## 2019-10-04 DIAGNOSIS — H401113 Primary open-angle glaucoma, right eye, severe stage: Secondary | ICD-10-CM | POA: Diagnosis not present

## 2019-10-04 DIAGNOSIS — R7303 Prediabetes: Secondary | ICD-10-CM | POA: Diagnosis not present

## 2019-10-04 DIAGNOSIS — I10 Essential (primary) hypertension: Secondary | ICD-10-CM | POA: Insufficient documentation

## 2019-10-04 DIAGNOSIS — H2511 Age-related nuclear cataract, right eye: Secondary | ICD-10-CM | POA: Diagnosis not present

## 2019-10-04 DIAGNOSIS — J302 Other seasonal allergic rhinitis: Secondary | ICD-10-CM | POA: Diagnosis not present

## 2019-10-04 HISTORY — DX: Sleep apnea, unspecified: G47.30

## 2019-10-04 HISTORY — DX: Other intervertebral disc degeneration, lumbar region without mention of lumbar back pain or lower extremity pain: M51.369

## 2019-10-04 HISTORY — DX: Other intervertebral disc degeneration, lumbar region: M51.36

## 2019-10-04 HISTORY — PX: CATARACT EXTRACTION W/PHACO: SHX586

## 2019-10-04 SURGERY — PHACOEMULSIFICATION, CATARACT, WITH IOL INSERTION
Anesthesia: Monitor Anesthesia Care | Site: Eye | Laterality: Right

## 2019-10-04 MED ORDER — EPINEPHRINE PF 1 MG/ML IJ SOLN
INTRAOCULAR | Status: DC | PRN
  Administered 2019-10-04: 60 mL via OPHTHALMIC

## 2019-10-04 MED ORDER — ACETAMINOPHEN 10 MG/ML IV SOLN: 1000.0000 mg | Freq: Once | INTRAVENOUS | Status: DC | PRN

## 2019-10-04 MED ORDER — MIDAZOLAM HCL 2 MG/2ML IJ SOLN
INTRAMUSCULAR | Status: DC | PRN
  Administered 2019-10-04 (×2): 1 mg via INTRAVENOUS

## 2019-10-04 MED ORDER — ONDANSETRON HCL 4 MG/2ML IJ SOLN: 4.0000 mg | Freq: Once | INTRAMUSCULAR | Status: DC | PRN

## 2019-10-04 MED ORDER — CEFUROXIME OPHTHALMIC INJECTION 1 MG/0.1 ML
INJECTION | OPHTHALMIC | Status: DC | PRN
  Administered 2019-10-04: 0.1 mL via INTRACAMERAL

## 2019-10-04 MED ORDER — FENTANYL CITRATE (PF) 100 MCG/2ML IJ SOLN
INTRAMUSCULAR | Status: DC | PRN
  Administered 2019-10-04: 50 ug via INTRAVENOUS

## 2019-10-04 MED ORDER — NA HYALUR & NA CHOND-NA HYALUR 0.4-0.35 ML IO KIT
PACK | INTRAOCULAR | Status: DC | PRN
  Administered 2019-10-04: 1 mL via INTRAOCULAR

## 2019-10-04 MED ORDER — TRYPAN BLUE 0.06 % OP SOLN
OPHTHALMIC | Status: DC | PRN
  Administered 2019-10-04: 0.5 mL via INTRAOCULAR

## 2019-10-04 MED ORDER — MOXIFLOXACIN HCL 0.5 % OP SOLN
1.0000 [drp] | Freq: Three times a day (TID) | OPHTHALMIC | Status: DC
  Administered 2019-10-04 (×3): 1 [drp] via OPHTHALMIC

## 2019-10-04 MED ORDER — LACTATED RINGERS IV SOLN: 100.0000 mL/h | INTRAVENOUS | Status: DC

## 2019-10-04 MED ORDER — ARMC OPHTHALMIC DILATING DROPS
1.0000 "application " | OPHTHALMIC | Status: DC | PRN
  Administered 2019-10-04 (×3): 1 via OPHTHALMIC

## 2019-10-04 MED ORDER — TETRACAINE HCL 0.5 % OP SOLN
1.0000 [drp] | OPHTHALMIC | Status: DC | PRN
  Administered 2019-10-04 (×3): 1 [drp] via OPHTHALMIC

## 2019-10-04 MED ORDER — LIDOCAINE HCL (PF) 2 % IJ SOLN
INTRAOCULAR | Status: DC | PRN
  Administered 2019-10-04: 2 mL

## 2019-10-04 SURGICAL SUPPLY — 26 items
BLADE DUAL KAHOOK SINGLE USE (BLADE) ×1 IMPLANT
CANNULA ANT/CHMB 27G (MISCELLANEOUS) ×1 IMPLANT
CANNULA ANT/CHMB 27GA (MISCELLANEOUS) ×4 IMPLANT
GLOVE SURG LX 7.5 STRW (GLOVE) ×1
GLOVE SURG LX STRL 7.5 STRW (GLOVE) ×1 IMPLANT
GLOVE SURG TRIUMPH 8.0 PF LTX (GLOVE) ×2 IMPLANT
GOWN STRL REUS W/ TWL LRG LVL3 (GOWN DISPOSABLE) ×2 IMPLANT
GOWN STRL REUS W/TWL LRG LVL3 (GOWN DISPOSABLE) ×2
ICLIP (OPHTHALMIC RELATED) ×1 IMPLANT
LENS IOL TECNIS ITEC 23.5 (Intraocular Lens) ×1 IMPLANT
MARKER SKIN DUAL TIP RULER LAB (MISCELLANEOUS) ×2 IMPLANT
NDL CAPSULORHEX 25GA (NEEDLE) ×1 IMPLANT
NDL FILTER BLUNT 18X1 1/2 (NEEDLE) ×2 IMPLANT
NEEDLE CAPSULORHEX 25GA (NEEDLE) ×2 IMPLANT
NEEDLE FILTER BLUNT 18X 1/2SAF (NEEDLE) ×2
NEEDLE FILTER BLUNT 18X1 1/2 (NEEDLE) ×2 IMPLANT
PACK CATARACT BRASINGTON (MISCELLANEOUS) ×2 IMPLANT
PACK EYE AFTER SURG (MISCELLANEOUS) ×2 IMPLANT
PACK OPTHALMIC (MISCELLANEOUS) ×2 IMPLANT
SOL BAL SALT 15ML (MISCELLANEOUS) ×2
SOLUTION BAL SALT 15ML (MISCELLANEOUS) IMPLANT
SOLUTION OPHTHALMIC SALT (MISCELLANEOUS) ×2 IMPLANT
SYR 3ML LL SCALE MARK (SYRINGE) ×4 IMPLANT
SYR TB 1ML LUER SLIP (SYRINGE) ×2 IMPLANT
WATER STERILE IRR 250ML POUR (IV SOLUTION) ×2 IMPLANT
WIPE NON LINTING 3.25X3.25 (MISCELLANEOUS) ×2 IMPLANT

## 2019-10-04 NOTE — Anesthesia Postprocedure Evaluation (Signed)
Anesthesia Post Note  Patient: Brandy Young  Procedure(s) Performed: CATARACT EXTRACTION PHACO AND INTRAOCULAR LENS PLACEMENT (IOC) [place] DUAL BLADE GONIOTOMY right 3.09 00:33.1 9.3% (Right Eye)     Patient location during evaluation: PACU Anesthesia Type: MAC Level of consciousness: awake and alert Pain management: pain level controlled Vital Signs Assessment: post-procedure vital signs reviewed and stable Respiratory status: spontaneous breathing, nonlabored ventilation, respiratory function stable and patient connected to nasal cannula oxygen Cardiovascular status: stable and blood pressure returned to baseline Postop Assessment: no apparent nausea or vomiting Anesthetic complications: no    Leonidus Rowand A  Coe Angelos

## 2019-10-04 NOTE — H&P (Signed)

## 2019-10-04 NOTE — Transfer of Care (Signed)
Immediate Anesthesia Transfer of Care Note  Patient: Brandy Young  Procedure(s) Performed: CATARACT EXTRACTION PHACO AND INTRAOCULAR LENS PLACEMENT (IOC) [place] DUAL BLADE GONIOTOMY right 3.09 00:33.1 9.3% (Right Eye)  Patient Location: PACU  Anesthesia Type: MAC  Level of Consciousness: awake, alert  and patient cooperative  Airway and Oxygen Therapy: Patient Spontanous Breathing and Patient connected to supplemental oxygen  Post-op Assessment: Post-op Vital signs reviewed, Patient's Cardiovascular Status Stable, Respiratory Function Stable, Patent Airway and No signs of Nausea or vomiting  Post-op Vital Signs: Reviewed and stable  Complications: No apparent anesthesia complications

## 2019-10-04 NOTE — Op Note (Signed)
PREOPERATIVE DIAGNOSIS:  Nuclear sclerotic cataract  right eye. H25.11  severe stage Primary Open Angle Glaucoma right eye H40.1113  POSTOPERATIVE DIAGNOSIS:    Nuclear sclerotic cataract right eye.     severe stage Primary Open Angle Glaucoma right eye H40.1113  PROCEDURE:  Phacoemusification with posterior chamber intraocular lens placement of the right eye  [place] Dual Blade goniotomy right eye  Ultrasound time: Procedure(s): CATARACT EXTRACTION PHACO AND INTRAOCULAR LENS PLACEMENT (IOC) [place] DUAL BLADE GONIOTOMY right 3.09 00:33.1 9.3% (Right) LENS:  Implant Name Type Inv. Item Serial No. Manufacturer Lot No. LRB No. Used Action  LENS IOL DIOP 23.5 - [id number] Intraocular Lens LENS IOL DIOP 23.5 [id number]  Right 1 Implanted    SURGEON:  Deirdre Evener, MD   ANESTHESIA:  Topical with tetracaine drops augmented with 1% preservative-free intracameral lidocaine.    COMPLICATIONS:  None.   DESCRIPTION OF PROCEDURE:  The patient was identified in the holding room and transported to the operating room and placed in the supine position under the operating microscope.  The right eye was identified as the operative eye and it was prepped and draped in the usual sterile ophthalmic fashion.   A 1 millimeter clear-corneal paracentesis was made at the 12:00 position.  0.5 ml of preservative-free 1% lidocaine was injected into the anterior chamber. Vision blue was placed into the anterior chamber and then rinsed with lidocaine.  The anterior chamber was filled with Viscoat viscoelastic.  A 2.4 millimeter keratome was used to make a near-clear corneal incision at the 9:00 position. The microscope was adjusted and a gonioprism was used to visulaize the trabecular meshwork.  The [facility] Dual Blade was advanced across the anterior chamber under viscoelastic.  The blade was used to mark the trabecular meshwork at the 1:30 position.  The blade was placed two clock hours clockwise into the  meshwork.  Proper postioning was confirmed.  The blade ws passed counterclockwise through the meshwork to excise approximately two to three clock-hours of trabecular meshwork.   A curvilinear capsulorrhexis was made with a cystotome and capsulorrhexis forceps.  Balanced salt solution was used to hydrodissect and hydrodelineate the nucleus.   Phacoemulsification was then used in stop and chop fashion to remove the lens nucleus and epinucleus.  The remaining cortex was then removed using the irrigation and aspiration handpiece. Provisc was then placed into the capsular bag to distend it for lens placement.  A lens was then injected into the capsular bag.  The remaining viscoelastic was aspirated.   Wounds were hydrated with balanced salt solution.  The anterior chamber was inflated to a physiologic pressure with balanced salt solution.  No wound leaks were noted. Cefuroxime 0.1 ml of a 10mg /ml solution was injected into the anterior chamber for a dose of 1 mg of intracameral antibiotic at the completion of the case.  The patient was taken to the recovery room in stable condition without complications of anesthesia or surgery.

## 2019-10-04 NOTE — Anesthesia Procedure Notes (Signed)
Procedure Name: MAC Date/Time: 10/04/2019 10:43 AM Performed by: Cameron Ali, CRNA Pre-anesthesia Checklist: Patient identified, Emergency Drugs available, Suction available, Timeout performed and Patient being monitored Patient Re-evaluated:Patient Re-evaluated prior to induction Oxygen Delivery Method: Nasal cannula Placement Confirmation: positive ETCO2

## 2019-10-05 ENCOUNTER — Encounter: Payer: Self-pay | Admitting: *Deleted

## 2019-10-22 ENCOUNTER — Other Ambulatory Visit: Payer: Self-pay | Admitting: Family Medicine

## 2019-10-22 DIAGNOSIS — R7303 Prediabetes: Secondary | ICD-10-CM

## 2019-10-22 DIAGNOSIS — D509 Iron deficiency anemia, unspecified: Secondary | ICD-10-CM

## 2019-10-22 DIAGNOSIS — E538 Deficiency of other specified B group vitamins: Secondary | ICD-10-CM

## 2019-10-22 DIAGNOSIS — E785 Hyperlipidemia, unspecified: Secondary | ICD-10-CM

## 2019-10-23 ENCOUNTER — Ambulatory Visit (INDEPENDENT_AMBULATORY_CARE_PROVIDER_SITE_OTHER): Payer: Medicare Other

## 2019-10-23 ENCOUNTER — Ambulatory Visit: Payer: Medicare Other

## 2019-10-23 ENCOUNTER — Other Ambulatory Visit (INDEPENDENT_AMBULATORY_CARE_PROVIDER_SITE_OTHER): Payer: Medicare Other

## 2019-10-23 VITALS — Wt 121.4 lb

## 2019-10-23 DIAGNOSIS — E538 Deficiency of other specified B group vitamins: Secondary | ICD-10-CM | POA: Diagnosis not present

## 2019-10-23 DIAGNOSIS — R7303 Prediabetes: Secondary | ICD-10-CM

## 2019-10-23 DIAGNOSIS — Z0000 Encounter for general adult medical examination without abnormal findings: Secondary | ICD-10-CM

## 2019-10-23 DIAGNOSIS — D509 Iron deficiency anemia, unspecified: Secondary | ICD-10-CM

## 2019-10-23 DIAGNOSIS — E785 Hyperlipidemia, unspecified: Secondary | ICD-10-CM

## 2019-10-23 LAB — LIPID PANEL
Cholesterol: 228 mg/dL — ABNORMAL HIGH (ref 0–200)
HDL: 82.9 mg/dL (ref >39.00)
LDL Cholesterol: 110 mg/dL — ABNORMAL HIGH (ref 0–99)
NonHDL: 144.73
Total CHOL/HDL Ratio: 3
Triglycerides: 174 mg/dL — ABNORMAL HIGH (ref 0.0–149.0)
VLDL: 34.8 mg/dL (ref 0.0–40.0)

## 2019-10-23 LAB — CBC WITH DIFFERENTIAL/PLATELET
Basophils Absolute: 0 10*3/uL (ref 0.0–0.1)
Basophils Relative: 0.4 % (ref 0.0–3.0)
Eosinophils Absolute: 0.2 10*3/uL (ref 0.0–0.7)
Eosinophils Relative: 1.7 % (ref 0.0–5.0)
HCT: 36.5 % (ref 36.0–46.0)
Hemoglobin: 12.3 g/dL (ref 12.0–15.0)
Lymphocytes Relative: 21.6 % (ref 12.0–46.0)
Lymphs Abs: 2 10*3/uL (ref 0.7–4.0)
MCHC: 33.7 g/dL (ref 30.0–36.0)
MCV: 93.8 fl (ref 78.0–100.0)
Monocytes Absolute: 0.7 10*3/uL (ref 0.1–1.0)
Monocytes Relative: 7.9 % (ref 3.0–12.0)
Neutro Abs: 6.2 10*3/uL (ref 1.4–7.7)
Neutrophils Relative %: 68.4 % (ref 43.0–77.0)
Platelets: 279 10*3/uL (ref 150.0–400.0)
RBC: 3.9 Mil/uL (ref 3.87–5.11)
RDW: 13.4 % (ref 11.5–15.5)
WBC: 9.1 10*3/uL (ref 4.0–10.5)

## 2019-10-23 LAB — COMPREHENSIVE METABOLIC PANEL
ALT: 17 U/L (ref 0–35)
AST: 26 U/L (ref 0–37)
Albumin: 4.7 g/dL (ref 3.5–5.2)
Alkaline Phosphatase: 74 U/L (ref 39–117)
BUN: 23 mg/dL (ref 6–23)
CO2: 26 mEq/L (ref 19–32)
Calcium: 10.8 mg/dL — ABNORMAL HIGH (ref 8.4–10.5)
Chloride: 104 mEq/L (ref 96–112)
Creatinine, Ser: 0.88 mg/dL (ref 0.40–1.20)
GFR: 62.47 mL/min (ref >60.00)
Glucose, Bld: 102 mg/dL — ABNORMAL HIGH (ref 70–99)
Potassium: 4.1 mEq/L (ref 3.5–5.1)
Sodium: 142 mEq/L (ref 135–145)
Total Bilirubin: 0.6 mg/dL (ref 0.2–1.2)
Total Protein: 7.3 g/dL (ref 6.0–8.3)

## 2019-10-23 LAB — HEMOGLOBIN A1C: Hgb A1c MFr Bld: 6.1 % (ref 4.6–6.5)

## 2019-10-23 LAB — FERRITIN: Ferritin: 33.9 ng/mL (ref 10.0–291.0)

## 2019-10-23 LAB — VITAMIN B12: Vitamin B-12: 976 pg/mL — ABNORMAL HIGH (ref 211–911)

## 2019-10-23 NOTE — Progress Notes (Signed)
PCP notes:  Health Maintenance: Mammogram- declined   Abnormal Screenings: none   Patient concerns: none   Nurse concerns: none   Next PCP appt.: 11/03/2019 @ 2:15 pm

## 2019-10-23 NOTE — Patient Instructions (Signed)
Brandy Young , Thank you for taking time to come for your Medicare Wellness Visit. I appreciate your ongoing commitment to your health goals. Please review the following plan we discussed and let me know if I can assist you in the future.   Screening recommendations/referrals: Colonoscopy: Up to date, completed 08/10/2011 Mammogram: declined Bone Density: Up to date, completed 11/16/2017 Recommended yearly ophthalmology/optometry visit for glaucoma screening and checkup Recommended yearly dental visit for hygiene and checkup  Vaccinations: Influenza vaccine: Up to date, completed 05/22/2019 Pneumococcal vaccine: Completed series Tdap vaccine: Up to date, completed 04/17/2013 Shingles vaccine: Completed series    Advanced directives: Please bring a copy of your POA (Power of [place]) and/or Living Will to your next appointment.   Conditions/risks identified: hypertension, hyperlipidemia  Next appointment: 11/03/2019 @ 2:15 pm    Preventive Care 65 Years and Older, Female Preventive care refers to lifestyle choices and visits with your health care provider that can promote health and wellness. What does preventive care include?  A yearly physical exam. This is also called an annual well check.  Dental exams once or twice a year.  Routine eye exams. Ask your health care provider how often you should have your eyes checked.  Personal lifestyle choices, including:  Daily care of your teeth and gums.  Regular physical activity.  Eating a healthy diet.  Avoiding tobacco and drug use.  Limiting alcohol use.  Practicing safe sex.  Taking low-dose aspirin every day.  Taking vitamin and mineral supplements as recommended by your health care provider. What happens during an annual well check? The services and screenings done by your health care provider during your annual well check will depend on your age, overall health, lifestyle risk factors, and family history of  disease. Counseling  Your health care provider may ask you questions about your:  Alcohol use.  Tobacco use.  Drug use.  Emotional well-being.  Home and relationship well-being.  Sexual activity.  Eating habits.  History of falls.  Memory and ability to understand (cognition).  Work and work Astronomer.  Reproductive health. Screening  You may have the following tests or measurements:  Height, weight, and BMI.  Blood pressure.  Lipid and cholesterol levels. These may be checked every 5 years, or more frequently if you are over 58 years old.  Skin check.  Lung cancer screening. You may have this screening every year starting at age 94 if you have a 30-pack-year history of smoking and currently smoke or have quit within the past 15 years.  Fecal occult blood test (FOBT) of the stool. You may have this test every year starting at age 32.  Flexible sigmoidoscopy or colonoscopy. You may have a sigmoidoscopy every 5 years or a colonoscopy every 10 years starting at age 1.  Hepatitis C blood test.  Hepatitis B blood test.  Sexually transmitted disease (STD) testing.  Diabetes screening. This is done by checking your blood sugar (glucose) after you have not eaten for a while (fasting). You may have this done every 1-3 years.  Bone density scan. This is done to screen for osteoporosis. You may have this done starting at age 55.  Mammogram. This may be done every 1-2 years. Talk to your health care provider about how often you should have regular mammograms. Talk with your health care provider about your test results, treatment options, and if necessary, the need for more tests. Vaccines  Your health care provider may recommend certain vaccines, such as:  Influenza vaccine.  This is recommended every year.  Tetanus, diphtheria, and acellular pertussis (Tdap, Td) vaccine. You may need a Td booster every 10 years.  Zoster vaccine. You may need this after age  40.  Pneumococcal 13-valent conjugate (PCV13) vaccine. One dose is recommended after age 73.  Pneumococcal polysaccharide (PPSV23) vaccine. One dose is recommended after age 31. Talk to your health care provider about which screenings and vaccines you need and how often you need them. This information is not intended to replace advice given to you by your health care provider. Make sure you discuss any questions you have with your health care provider. Document Released: 09/13/2015 Document Revised: 05/06/2016 Document Reviewed: 06/18/2015 Elsevier Interactive Patient Education  2017 [place] Prevention in the Home Falls can cause injuries. They can happen to people of all ages. There are many things you can do to make your home safe and to help prevent falls. What can I do on the outside of my home?  Regularly fix the edges of walkways and driveways and fix any cracks.  Remove anything that might make you trip as you walk through a door, such as a raised step or threshold.  Trim any bushes or trees on the path to your home.  Use bright outdoor lighting.  Clear any walking paths of anything that might make someone trip, such as rocks or tools.  Regularly check to see if handrails are loose or broken. Make sure that both sides of any steps have handrails.  Any raised decks and porches should have guardrails on the edges.  Have any leaves, snow, or ice cleared regularly.  Use sand or salt on walking paths during winter.  Clean up any spills in your garage right away. This includes oil or grease spills. What can I do in the bathroom?  Use night lights.  Install grab bars by the toilet and in the tub and shower. Do not use towel bars as grab bars.  Use non-skid mats or decals in the tub or shower.  If you need to sit down in the shower, use a plastic, non-slip stool.  Keep the floor dry. Clean up any water that spills on the floor as soon as it happens.  Remove  soap buildup in the tub or shower regularly.  Attach bath mats securely with double-sided non-slip rug tape.  Do not have throw rugs and other things on the floor that can make you trip. What can I do in the bedroom?  Use night lights.  Make sure that you have a light by your bed that is easy to reach.  Do not use any sheets or blankets that are too big for your bed. They should not hang down onto the floor.  Have a firm chair that has side arms. You can use this for support while you get dressed.  Do not have throw rugs and other things on the floor that can make you trip. What can I do in the kitchen?  Clean up any spills right away.  Avoid walking on wet floors.  Keep items that you use a lot in easy-to-reach places.  If you need to reach something above you, use a strong step stool that has a grab bar.  Keep electrical cords out of the way.  Do not use floor polish or wax that makes floors slippery. If you must use wax, use non-skid floor wax.  Do not have throw rugs and other things on the floor that can make  you trip. What can I do with my stairs?  Do not leave any items on the stairs.  Make sure that there are handrails on both sides of the stairs and use them. Fix handrails that are broken or loose. Make sure that handrails are as long as the stairways.  Check any carpeting to make sure that it is firmly attached to the stairs. Fix any carpet that is loose or worn.  Avoid having throw rugs at the top or bottom of the stairs. If you do have throw rugs, attach them to the floor with carpet tape.  Make sure that you have a light switch at the top of the stairs and the bottom of the stairs. If you do not have them, ask someone to add them for you. What else can I do to help prevent falls?  Wear shoes that:  Do not have high heels.  Have rubber bottoms.  Are comfortable and fit you well.  Are closed at the toe. Do not wear sandals.  If you use a  stepladder:  Make sure that it is fully opened. Do not climb a closed stepladder.  Make sure that both sides of the stepladder are locked into place.  Ask someone to hold it for you, if possible.  Clearly mark and make sure that you can see:  Any grab bars or handrails.  First and last steps.  Where the edge of each step is.  Use tools that help you move around (mobility aids) if they are needed. These include:  Canes.  Walkers.  Scooters.  Crutches.  Turn on the lights when you go into a dark area. Replace any light bulbs as soon as they burn out.  Set up your furniture so you have a clear path. Avoid moving your furniture around.  If any of your floors are uneven, fix them.  If there are any pets around you, be aware of where they are.  Review your medicines with your doctor. Some medicines can make you feel dizzy. This can increase your chance of falling. Ask your doctor what other things that you can do to help prevent falls. This information is not intended to replace advice given to you by your health care provider. Make sure you discuss any questions you have with your health care provider. Document Released: 06/13/2009 Document Revised: 01/23/2016 Document Reviewed: 09/21/2014 Elsevier Interactive Patient Education  2017 [employer].

## 2019-10-23 NOTE — Progress Notes (Addendum)
Subjective:   SARITA HAKANSON is a 74 y.o. female who presents for Medicare Annual (Subsequent) preventive examination.  Review of Systems: N/A   This visit is being conducted through telemedicine via telephone at the nurse health advisor's home address due to the COVID-19 pandemic. This patient has given me verbal consent via doximity to conduct this visit, patient states they are participating from their home address. Patient and myself are on the telephone call. There is no referral for this visit. Some vital signs may be absent or patient reported.    Patient identification: identified by name, DOB, and current address   Cardiac Risk Factors include: advanced age (>37men, >64 women);hypertension;dyslipidemia;sedentary lifestyle     Objective:     Vitals: Wt 121 lb 6.4 oz (55.1 kg)   BMI 24.52 kg/m   Body mass index is 24.52 kg/m.  Advanced Directives 10/23/2019 09/06/2019 10/10/2018 11/26/2017 10/29/2017 10/29/2017 10/04/2017  Does Patient Have a Medical Advance Directive? Yes Yes Yes No No No No  Type of Paramedic of [place];Living will Living will [place];Living will - - - -  Copy of [place] in Chart? No - copy requested - No - copy requested - - - -  Would patient like information on creating a medical advance directive? - - - No - Patient declined No - Patient declined - No - Patient declined;Yes (MAU/Ambulatory/Procedural Areas - Information given)    Tobacco Social History   Tobacco Use  Smoking Status Never Smoker  Smokeless Tobacco Never Used     Counseling given: Not Answered   Clinical Intake:  Pre-visit preparation completed: Yes  Pain : No/denies pain Pain Score: 0-No pain     Nutritional Risks: None Diabetes: No  How often do you need to have someone help you when you read instructions, pamphlets, or other written materials from your doctor or pharmacy?: 1 - Never What is the last grade  level you completed in school?: 12th  Interpreter Needed?: No  Information entered by :: CJohnson, LPN  Past Medical History:  Diagnosis Date  . Allergic rhinitis due to pollen   . Cataract   . Complication of anesthesia 10/2017   hypotensive episode after cysto requiring ICU stay  . DDD (degenerative disc disease), lumbar    with spinal stenosis and neurogenic claudication s/p ESI (Chasnis)  . Degenerative disc disease, lumbar   . DJD (degenerative joint disease) of knee    failed steroid and synvisc injections  . E. coli UTI 11/05/2017  . Estrogen deficiency   . GERD (gastroesophageal reflux disease)   . Glaucoma   . History of chicken pox   . History of kidney stones   . History of stomach ulcers 2010s  . HLD (hyperlipidemia)   . HTN (hypertension)   . IDA (iron deficiency anemia) 2012   s/p normal EGD/colonoscopy (iftikhar)  . 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 Neuromuscular disorder ([place])    sometimes fingers get numb  . Prediabetes   . Sleep apnea    no-CPAP  . Tricuspid regurgitation 2012   by echo, EF 73% Humphrey Rolls)   Past Surgical History:  Procedure Laterality Date  . APPENDECTOMY    . CATARACT EXTRACTION W/PHACO Left 09/06/2019   Procedure: CATARACT EXTRACTION PHACO AND INTRAOCULAR LENS PLACEMENT (IOC) LEFT KAHOOK DUAL BLADE GONIOTOMY 5.67,     00:46.4,  30.5%;  Surgeon: Lockie Mola, MD;  Location: [facility];  Service: Ophthalmology;  Laterality: Left;  requests arrival 9:30 or after  . CATARACT EXTRACTION W/PHACO Right 10/04/2019   Procedure: CATARACT EXTRACTION PHACO AND INTRAOCULAR LENS PLACEMENT (IOC) KAHOOK DUAL BLADE GONIOTOMY right 3.09 00:33.1 9.3%;  Surgeon: Lockie Mola, MD;  [facility];  Service: Ophthalmology;  Laterality: Right;  . COLONOSCOPY  08/2011   diverticulosis, int hem (Iftikhar)  . CYSTOSCOPY W/  RETROGRADES Left 10/29/2017   Procedure: CYSTOSCOPY WITH RETROGRADE PYELOGRAM;  Surgeon: Riki Altes, MD;  Location: [facility];  Service: Urology;  Laterality: Left;  . CYSTOSCOPY W/ URETERAL STENT PLACEMENT Left 12/03/2017   Procedure: CYSTOSCOPY WITH STENT REPLACEMENT;  Surgeon: Riki Altes, MD;  Location: [facility];  Service: Urology;  Laterality: Left;  . CYSTOSCOPY WITH STENT PLACEMENT Left 10/29/2017   Procedure: CYSTOSCOPY WITH STENT PLACEMENT;  Surgeon: Riki Altes, MD;  Location: [facility];  Service: Urology;  Laterality: Left;  . CYSTOSCOPY WITH URETEROSCOPY Left 12/03/2017   Procedure: CYSTOSCOPY WITH URETEROSCOPY;  Surgeon: Riki Altes, MD;  Location: [facility];  Service: Urology;  Laterality: Left;  . ESI  multiple, latest 03/2015   R L5/S1, L L3/4, L L4/5 L5/S1, L L4/5 and L5/S1 - minimal relief (Chasnis)  . ESOPHAGOGASTRODUODENOSCOPY  08/2011   WNL  . EYE SURGERY Bilateral    laser eye surgery for glaucoma  . FOOT SURGERY Right   . TOE SURGERY Right 2005   titanium for toe replacement  . VAGINAL HYSTERECTOMY  1980s   prolapsed uterus, ovaries remained   Family History  Problem Relation Age of Onset  . Stroke Father 48       hemorrhagic bleed  . Hypertension Father   . Cancer Mother 50       breast  . Breast cancer Mother 76  . Cancer Brother 61       lung (nonsmoker)  . Breast cancer Paternal Grandmother   . CAD Neg Hx   . Diabetes Neg Hx    Social History   Socioeconomic History  . Marital status: Widowed    Spouse name: Not on file  . Number of children: Not on file  . Years of education: Not on file  . Highest education level: Not on file  Occupational History  . Not on file  Tobacco Use  . Smoking status: Never Smoker  . Smokeless tobacco: Never Used  Substance and Sexual Activity  . Alcohol use: No    Alcohol/week: 0.0 standard drinks  . Drug use: No  . Sexual activity: Not Currently  Other Topics Concern  . Not on file  Social History  Narrative   Lives alone, 1 dog. Widow - heart issues   Children nearby   Occupation: part time Engineer, petroleum   Edu: HS   Activity: stays active with family and church   Diet: good water, fruits/vegetables daily   Social Determinants of Health   Financial Resource Strain: Low Risk   . Difficulty of Paying Living Expenses: Not hard at all  Food Insecurity: No Food Insecurity  . Worried About Programme researcher, broadcasting/film/video in the Last Year: Never true  . Ran Out of Food in the Last Year: Never true  Transportation Needs: No Transportation Needs  . Lack of Transportation (Medical): No  . Lack of Transportation (Non-Medical): No  Physical Activity: Inactive  . Days of Exercise per Week: 0 days  . Minutes of Exercise per Session:  0 min  Stress: Stress Concern Present  . Feeling of Stress : Rather much  Social Connections:   . Frequency of Communication with Friends and Family: Not on file  . Frequency of Social Gatherings with Friends and Family: Not on file  . Attends Religious Services: Not on file  . Active Member of Clubs or Organizations: Not on file  . Attends Banker Meetings: Not on file  . Marital Status: Not on file    Outpatient Encounter Medications as of 10/23/2019  Medication Sig  . Ascorbic Acid (VITAMIN C PO) Take 5,000 mg by mouth.  . Calcium Citrate 333 MG TABS Take 2 tablets by mouth 2 (two) times daily.  . cetirizine (ZYRTEC) 10 MG tablet Take 10 mg by mouth daily.  . Cholecalciferol (VITAMIN D3) 25 MCG (1000 UT) CAPS Take 1 capsule (1,000 Units total) by mouth daily.  . famotidine (PEPCID) 20 MG tablet Take 1 tablet (20 mg total) by mouth at bedtime. (Patient taking differently: Take 20 mg by mouth daily as needed. )  . ferrous sulfate 325 (65 FE) MG tablet Take 1 tablet (325 mg total) by mouth every other day.  . hydrochlorothiazide (MICROZIDE) 12.5 MG capsule TAKE 1 CAPSULE BY MOUTH ONCE DAILY AS NEEDED FOR  LEG  SWELLING  . HYDROcodone-acetaminophen  (NORCO) 7.5-325 MG per tablet Take 1 tablet by mouth 3 (three) times daily as needed for moderate pain. (Dr Yves Dill)  . ibuprofen (ADVIL,MOTRIN) 200 MG tablet Take 200 mg by mouth every 6 (six) hours as needed.   Marland Kitchen lisinopril (ZESTRIL) 10 MG tablet TAKE 1 TABLET BY MOUTH  DAILY  . Magnesium ([employer] TRIPLE MAGNESIUM COMPLEX) 400 MG CAPS Take 2 capsules by mouth 2 (two) times daily.  . Omega-3 Fatty Acids (FISH OIL) 1200 MG CAPS Take 1 capsule by mouth daily.   Marland Kitchen oxybutynin (DITROPAN) 5 MG tablet TAKE 1 TABLET BY MOUTH THREE TIMES A DAY  . psyllium (METAMUCIL) 58.6 % powder Take 1 packet by mouth daily.  Marland Kitchen senna-docusate (SENOKOT-S) 8.6-50 MG tablet Take 1 tablet by mouth at bedtime as needed for mild constipation. (Patient taking differently: Take 1 tablet by mouth at bedtime. )  . simvastatin (ZOCOR) 10 MG tablet TAKE 1 TABLET BY MOUTH  DAILY  . vitamin B-12 (CYANOCOBALAMIN) 500 MCG tablet Take 1 tablet (500 mcg total) by mouth daily.  . Zinc 50 MG TABS Take by mouth.  . fesoterodine (TOVIAZ) 8 MG TB24 tablet Take 8 mg by mouth daily.  . mirabegron ER (MYRBETRIQ) 50 MG TB24 tablet Take 50 mg by mouth daily as needed.   No facility-administered encounter medications on file as of 10/23/2019.    Activities of Daily Living In your present state of health, do you have any difficulty performing the following activities: 10/23/2019 10/04/2019  Hearing? N N  Vision? N N  Difficulty concentrating or making decisions? N N  Walking or climbing stairs? N N  Dressing or bathing? N N  Doing errands, shopping? N -  Preparing Food and eating ? N -  Using the Toilet? N -  In the past six months, have you accidently leaked urine? Y -  Comment takes medication -  Do you have problems with loss of bowel control? N -  Managing your Medications? N -  Managing your Finances? N -  Housekeeping or managing your Housekeeping? N -  Some recent data might be hidden    Patient Care Team: Eustaquio Boyden, MD as  PCP -  General (Family Medicine) Merri Ray, MD as Referring Physician (Physical Medicine and Rehabilitation) Lockie Mola, MD as Referring Physician (Ophthalmology)    Assessment:   This is a routine wellness examination for Florance.  Exercise Activities and Dietary recommendations Current Exercise Habits: The patient does not participate in regular exercise at present, Exercise limited by: None identified  Goals    . Patient Stated     Starting 10/10/2018, I will continue to take medications as prescribed.     . Patient Stated     10/23/2019, I will maintain and continue medications as prescribed.        Fall Risk Fall Risk  10/23/2019 10/10/2018 10/04/2017 07/31/2016 04/19/2015  Falls in the past year? 0 0 No Yes Yes  Number falls in past yr: 0 - - 2 or more 2 or more  Injury with Fall? 0 - - No No  Risk for fall due to : Medication side effect - - - -  Follow up Falls evaluation completed;Falls prevention discussed - - - -   Is the patient's home free of loose throw rugs in walkways, pet beds, electrical cords, etc?   yes      Grab bars in the bathroom? no      Handrails on the stairs?   yes      Adequate lighting?   yes  Timed Get Up and Go performed: N/A  Depression Screen PHQ 2/9 Scores 10/23/2019 10/10/2018 10/04/2017 07/31/2016  PHQ - 2 Score 0 0 0 0  PHQ- 9 Score 0 0 0 -     Cognitive Function MMSE - Mini Mental State Exam 10/23/2019 10/10/2018 10/04/2017  Orientation to time 5 5 5   Orientation to Place 5 5 5   Registration 3 3 3   Attention/ Calculation 5 0 0  Recall 3 3 2   Recall-comments - - unable to recall 1 of 3 words  Language- name 2 objects - 0 0  Language- repeat 1 1 1   Language- follow 3 step command - 3 3  Language- read & follow direction - 0 0  Write a sentence - 0 0  Copy design - 0 0  Total score - 20 19  Mini Cog  Mini-Cog screen was completed. Maximum score is 22. A value of 0 denotes this part of the MMSE was not completed or the  patient failed this part of the Mini-Cog screening.       Immunization History  Administered Date(s) Administered  . Fluad Quad(high Dose 65+) 05/29/2019, 08/03/2019  . Influenza, High Dose Seasonal PF 07/07/2015, 06/29/2016, 06/26/2018  . Influenza-Unspecified 05/31/2014, 06/14/2017, 05/22/2019  . Pneumococcal Conjugate-13 07/31/2014  . Pneumococcal Polysaccharide-23 06/29/2016  . Tdap 04/17/2013  . Zoster 08/31/2008  . Zoster Recombinat (Shingrix) 06/19/2017, 11/13/2017    Qualifies for Shingles Vaccine: Completed series  Screening Tests Health Maintenance  Topic Date Due  . DTAP VACCINES (1) 12/16/1945  . MAMMOGRAM  10/22/2020 (Originally 06/15/2019)  . DTaP/Tdap/Td (2 - Td) 04/18/2023  . TETANUS/TDAP  04/18/2023  . INFLUENZA VACCINE  Completed  . DEXA SCAN  Completed  . Hepatitis C Screening  Completed  . PNA vac Low Risk Adult  Completed    Cancer Screenings: Lung: Low Dose CT Chest recommended if Age 71-80 years, 30 pack-year currently smoking OR have quit w/in 15years. Patient does not qualify. Breast: Mammogram: No, patient declined  Up to date of Bone Density/Dexa? Yes, completed 11/16/2017 Colorectal: completed 08/10/2011  Additional Screenings:  Hepatitis C Screening: 07/27/2016  Plan:   Patient will maintain and continue medications as prescribed.    I have personally reviewed and noted the following in the patient's chart:   . Medical and social history . Use of alcohol, tobacco or illicit drugs  . Current medications and supplements . Functional ability and status . Nutritional status . Physical activity . Advanced directives . List of other physicians . Hospitalizations, surgeries, and ER visits in previous 12 months . Vitals . Screenings to include cognitive, depression, and falls . Referrals and appointments  In addition, I have reviewed and discussed with patient certain preventive protocols, quality metrics, and best practice  recommendations. A written personalized care plan for preventive services as well as general preventive health recommendations were provided to patient.     Janalyn Shy, LPN  04/08/9832

## 2019-11-03 ENCOUNTER — Other Ambulatory Visit: Payer: Self-pay

## 2019-11-03 ENCOUNTER — Ambulatory Visit (INDEPENDENT_AMBULATORY_CARE_PROVIDER_SITE_OTHER): Payer: Medicare Other | Admitting: Family Medicine

## 2019-11-03 ENCOUNTER — Encounter: Payer: Self-pay | Admitting: Family Medicine

## 2019-11-03 VITALS — BP 136/80 | HR 87 | Temp 98.0°F | Ht <= 58 in | Wt 126.3 lb

## 2019-11-03 DIAGNOSIS — M5136 Other intervertebral disc degeneration, lumbar region: Secondary | ICD-10-CM

## 2019-11-03 DIAGNOSIS — R7303 Prediabetes: Secondary | ICD-10-CM

## 2019-11-03 DIAGNOSIS — M48062 Spinal stenosis, lumbar region with neurogenic claudication: Secondary | ICD-10-CM

## 2019-11-03 DIAGNOSIS — M79604 Pain in right leg: Secondary | ICD-10-CM

## 2019-11-03 DIAGNOSIS — H409 Unspecified glaucoma: Secondary | ICD-10-CM

## 2019-11-03 DIAGNOSIS — I10 Essential (primary) hypertension: Secondary | ICD-10-CM

## 2019-11-03 DIAGNOSIS — M79605 Pain in left leg: Secondary | ICD-10-CM

## 2019-11-03 DIAGNOSIS — G8929 Other chronic pain: Secondary | ICD-10-CM

## 2019-11-03 DIAGNOSIS — Z0000 Encounter for general adult medical examination without abnormal findings: Secondary | ICD-10-CM

## 2019-11-03 DIAGNOSIS — H401113 Primary open-angle glaucoma, right eye, severe stage: Secondary | ICD-10-CM

## 2019-11-03 DIAGNOSIS — M7989 Other specified soft tissue disorders: Secondary | ICD-10-CM

## 2019-11-03 DIAGNOSIS — K5909 Other constipation: Secondary | ICD-10-CM

## 2019-11-03 DIAGNOSIS — M170 Bilateral primary osteoarthritis of knee: Secondary | ICD-10-CM

## 2019-11-03 DIAGNOSIS — E785 Hyperlipidemia, unspecified: Secondary | ICD-10-CM | POA: Diagnosis not present

## 2019-11-03 DIAGNOSIS — K219 Gastro-esophageal reflux disease without esophagitis: Secondary | ICD-10-CM

## 2019-11-03 DIAGNOSIS — Z7189 Other specified counseling: Secondary | ICD-10-CM | POA: Diagnosis not present

## 2019-11-03 DIAGNOSIS — M51369 Other intervertebral disc degeneration, lumbar region without mention of lumbar back pain or lower extremity pain: Secondary | ICD-10-CM

## 2019-11-03 DIAGNOSIS — N3941 Urge incontinence: Secondary | ICD-10-CM

## 2019-11-03 DIAGNOSIS — E538 Deficiency of other specified B group vitamins: Secondary | ICD-10-CM

## 2019-11-03 DIAGNOSIS — D509 Iron deficiency anemia, unspecified: Secondary | ICD-10-CM

## 2019-11-03 DIAGNOSIS — M8580 Other specified disorders of bone density and structure, unspecified site: Secondary | ICD-10-CM

## 2019-11-03 MED ORDER — NORTRIPTYLINE HCL 25 MG PO CAPS: 25.0000 mg | ORAL_CAPSULE | Freq: Every day | ORAL | 3 refills | Status: DC

## 2019-11-03 MED ORDER — VITAMIN B-12 500 MCG PO TABS: 500.0000 ug | ORAL_TABLET | ORAL | Status: AC

## 2019-11-03 NOTE — Patient Instructions (Addendum)
Look into compression stockings to help leg swelling.  Try nortriptyline 25mg  at night time for sleep and for leg pain. If helping urination, may stop oxybutynin. Alternatively may discuss trial lyrica in the future for leg pain.  Decrease vitamin B12 to every other day.  Good to see you today.  Best of luck with the new move! See if Dr Catalina Lunger is available in [place].   Health Maintenance After Age 76 After age 110, you are at a higher risk for certain long-term diseases and infections as well as injuries from falls. Falls are a major cause of broken bones and head injuries in people who are older than age 39. Getting regular preventive care can help to keep you healthy and well. Preventive care includes getting regular testing and making lifestyle changes as recommended by your health care provider. Talk with your health care provider about:  Which screenings and tests you should have. A screening is a test that checks for a disease when you have no symptoms.  A diet and exercise plan that is right for you. What should I know about screenings and tests to prevent falls? Screening and testing are the best ways to find a health problem early. Early diagnosis and treatment give you the best chance of managing medical conditions that are common after age 27. Certain conditions and lifestyle choices may make you more likely to have a fall. Your health care provider may recommend:  Regular vision checks. Poor vision and conditions such as cataracts can make you more likely to have a fall. If you wear glasses, make sure to get your prescription updated if your vision changes.  Medicine review. Work with your health care provider to regularly review all of the medicines you are taking, including over-the-counter medicines. Ask your health care provider about any side effects that may make you more likely to have a fall. Tell your health care provider if any medicines that you take make you feel  dizzy or sleepy.  Osteoporosis screening. Osteoporosis is a condition that causes the bones to get weaker. This can make the bones weak and cause them to break more easily.  Blood pressure screening. Blood pressure changes and medicines to control blood pressure can make you feel dizzy.  Strength and balance checks. Your health care provider may recommend certain tests to check your strength and balance while standing, walking, or changing positions.  Foot health exam. Foot pain and numbness, as well as not wearing proper footwear, can make you more likely to have a fall.  Depression screening. You may be more likely to have a fall if you have a fear of falling, feel emotionally low, or feel unable to do activities that you used to do.  Alcohol use screening. Using too much alcohol can affect your balance and may make you more likely to have a fall. What actions can I take to lower my risk of falls? General instructions  Talk with your health care provider about your risks for falling. Tell your health care provider if: ? You fall. Be sure to tell your health care provider about all falls, even ones that seem minor. ? You feel dizzy, sleepy, or off-balance.  Take over-the-counter and prescription medicines only as told by your health care provider. These include any supplements.  Eat a healthy diet and maintain a healthy weight. A healthy diet includes low-fat dairy products, low-fat (lean) meats, and fiber from whole grains, beans, and lots of fruits and vegetables. Home  safety  Remove any tripping hazards, such as rugs, cords, and clutter.  Install safety equipment such as grab bars in bathrooms and safety rails on stairs.  Keep rooms and walkways well-lit. Activity   Follow a regular exercise program to stay fit. This will help you maintain your balance. Ask your health care provider what types of exercise are appropriate for you.  If you need a cane or walker, use it as  recommended by your health care provider.  Wear supportive shoes that have nonskid soles. Lifestyle  Do not drink alcohol if your health care provider tells you not to drink.  If you drink alcohol, limit how much you have: ? 0-1 drink a day for women. ? 0-2 drinks a day for men.  Be aware of how much alcohol is in your drink. In the [place] one drink equals one typical bottle of beer (12 oz), one-half glass of wine (5 oz), or one shot of hard liquor (1 oz).  Do not use any products that contain nicotine or tobacco, such as cigarettes and e-cigarettes. If you need help quitting, ask your health care provider. Summary  Having a healthy lifestyle and getting preventive care can help to protect your health and wellness after age 36.  Screening and testing are the best way to find a health problem early and help you avoid having a fall. Early diagnosis and treatment give you the best chance for managing medical conditions that are more common for people who are older than age 2.  Falls are a major cause of broken bones and head injuries in people who are older than age 72. Take precautions to prevent a fall at home.  Work with your health care provider to learn what changes you can make to improve your health and wellness and to prevent falls. This information is not intended to replace advice given to you by your health care provider. Make sure you discuss any questions you have with your health care provider. Document Revised: 12/08/2018 Document Reviewed: 06/30/2017 Elsevier Patient Education  2020 [employer].

## 2019-11-03 NOTE — Progress Notes (Signed)
This visit was conducted in person.  BP 136/80 (BP Location: Left Arm, Patient Position: Sitting, Cuff Size: Normal)   Pulse 87   Temp 98 F (36.7 C) (Temporal)   Ht 4\' 10"  (1.473 m)   Wt 126 lb 5 oz (57.3 kg)   SpO2 97%   BMI 26.40 kg/m    CC: CPE Subjective:    Patient ID: , female    DOB: 10-24-1945, 74 y.o.   MRN: [id number]  HPI: Brandy Young is a 74 y.o. female presenting on 11/03/2019 for Annual Exam (Prt 2. )   She sold her house and moved in with her son and his family per his request. She is second guessing this change. Planning to move to [place] tomorrow with her son. They are building a house and she will have separate space at son's house. Retired from [place] [employer] 10/26/2019. Planning to find another part time job after move to [place].   Saw health advisor last week for medicare wellness visit. Note reviewed.    No exam data present    Clinical Support from 10/23/2019 in [facility] Total Score  0      Fall Risk  10/23/2019 10/10/2018 10/04/2017 07/31/2016 04/19/2015  Falls in the past year? 0 0 No Yes Yes  Number falls in past yr: 0 - - 2 or more 2 or more  Injury with Fall? 0 - - No No  Risk for fall due to : Medication side effect - - - -  Follow up Falls evaluation completed;Falls prevention discussed - - - -   Ongoing leg swelling, endorses chronic L>R - but trouble handling hctz diuretic. S/p venous 04/21/2015 done 2013 - no DVT.   Chronic leg and back pain - sees PMR s/p recent steroid injections. Chronic leg pains worse at night. Not consistent with RLS, denies burning pain.  Ongoing urge incontinence (see below).   Preventative: COLONOSCOPY Date: 08/2011 diverticulosis, int hem (Iftikhar) Well woman exam - s/p hysterectomy, ovaries remain. Aged out. Mammogram -05/2018 birads1.  Lung cancer screen - not eligible DEXA 10/2017 T score -2.3 hip (osteopenia) Flu shot - yearly Tdap 2014 Prevnar  07/2014.Pneumovax 2017. zostavax 2010. Pfizer covid vaccine - completed 10/2019 shingrix - completed 05/2017, 10/2017 Advanced directives: has not set up but has discussed with children. HCPOA - children. Packet previously provided today. Seat belt use discussed  Sunscreen use discussed. No changing moles on skin.  Non smoker  Alcohol - none Dentist q96mo Eye exam regularly - had bilateral cataract and glaucoma surgery 09/2019 Bowel - chronic constipation due to opiate and iron managed with metamucil and sennakot Bladder - ongoing incontinence managed with oxybutynin. 10/2019 most effective but not covered by insurance. Myrbetriq helped but was unaffordable.   Lives alone, 1 dog. Widow. Just moved in with son Children nearby Occupation: part [employer]- 2retired 10/26/2019 Edu: HS Activity: stays active with family and church  Diet: good water, fruits/vegetables     Relevant past medical, surgical, family and social history reviewed and updated as indicated. Interim medical history since our last visit reviewed. Allergies and medications reviewed and updated. Outpatient Medications Prior to Visit  Medication Sig Dispense Refill  . Ascorbic Acid (VITAMIN C PO) Take 5,000 mg by mouth.    . Calcium Citrate 333 MG TABS Take 2 tablets by mouth 2 (two) times daily.    . cetirizine (ZYRTEC) 10 MG tablet Take 10 mg by mouth daily.    10/28/2019  Cholecalciferol (VITAMIN D3) 25 MCG (1000 UT) CAPS Take 1 capsule (1,000 Units total) by mouth daily. 30 capsule   . famotidine (PEPCID) 20 MG tablet Take 1 tablet (20 mg total) by mouth at bedtime. (Patient taking differently: Take 20 mg by mouth daily as needed. ) 30 tablet 3  . ferrous sulfate 325 (65 FE) MG tablet Take 1 tablet (325 mg total) by mouth every other day.  3  . HYDROcodone-acetaminophen (NORCO) 7.5-325 MG per tablet Take 1 tablet by mouth 3 (three) times daily as needed for moderate pain. (Dr Yves Dill)    . ibuprofen (ADVIL,MOTRIN)  200 MG tablet Take 200 mg by mouth every 6 (six) hours as needed.     Marland Kitchen lisinopril (ZESTRIL) 10 MG tablet TAKE 1 TABLET BY MOUTH  DAILY 90 tablet 0  . Magnesium (CVS TRIPLE MAGNESIUM COMPLEX) 400 MG CAPS Take 2 capsules by mouth 2 (two) times daily.    . Omega-3 Fatty Acids (FISH OIL) 1200 MG CAPS Take 1 capsule by mouth daily.     Marland Kitchen oxybutynin (DITROPAN) 5 MG tablet TAKE 1 TABLET BY MOUTH THREE TIMES A DAY 90 tablet 11  . psyllium (METAMUCIL) 58.6 % powder Take 1 packet by mouth daily.    Marland Kitchen senna-docusate (SENOKOT-S) 8.6-50 MG tablet Take 1 tablet by mouth at bedtime as needed for mild constipation. (Patient taking differently: Take 1 tablet by mouth at bedtime. ) 30 tablet 0  . simvastatin (ZOCOR) 10 MG tablet TAKE 1 TABLET BY MOUTH  DAILY 90 tablet 0  . Zinc 50 MG TABS Take by mouth.    . hydrochlorothiazide (MICROZIDE) 12.5 MG capsule TAKE 1 CAPSULE BY MOUTH ONCE DAILY AS NEEDED FOR  LEG  SWELLING 90 capsule 2  . vitamin B-12 (CYANOCOBALAMIN) 500 MCG tablet Take 1 tablet (500 mcg total) by mouth daily.    . fesoterodine (TOVIAZ) 8 MG TB24 tablet Take 8 mg by mouth daily.    . mirabegron ER (MYRBETRIQ) 50 MG TB24 tablet Take 50 mg by mouth daily as needed.     No facility-administered medications prior to visit.    Past Medical History:  Diagnosis Date  . Allergic rhinitis due to pollen   . Anatomical narrow angle glaucoma, bilateral   . Cataract   . Complication of anesthesia 10/2017   hypotensive episode after cysto requiring ICU stay  . DDD (degenerative disc disease), lumbar    with spinal stenosis and neurogenic claudication s/p ESI (Chasnis)  . Degenerative disc disease, lumbar   . DJD (degenerative joint disease) of knee    failed steroid and synvisc injections  . E. coli UTI 11/05/2017  . Estrogen deficiency   . GERD (gastroesophageal reflux disease)   . History of chicken pox   . History of kidney stones   . History of stomach ulcers 2010s  . HLD (hyperlipidemia)   . HTN  (hypertension)   . IDA (iron deficiency anemia) 2012   s/p normal EGD/colonoscopy (iftikhar)  . 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 Neuromuscular disorder (HCC)    sometimes fingers get numb  . Prediabetes   . Sleep apnea    no-CPAP  . Tricuspid regurgitation 2012   by echo, EF 73% Welton Flakes)    Past Surgical History:  Procedure Laterality Date  . APPENDECTOMY    . CATARACT EXTRACTION W/PHACO Left 09/06/2019   Procedure: CATARACT EXTRACTION PHACO AND INTRAOCULAR LENS PLACEMENT (  IOC) LEFT KAHOOK DUAL BLADE GONIOTOMY 5.67,     00:46.4,     30.5%;  Surgeon: Lockie MolaBrasington, Chadwick, MD;  [facility];  Service: Ophthalmology;  Laterality: Left;  requests arrival 9:30 or after  . CATARACT EXTRACTION W/PHACO Right 10/04/2019   Procedure: CATARACT EXTRACTION PHACO AND INTRAOCULAR LENS PLACEMENT (IOC) KAHOOK DUAL BLADE GONIOTOMY right 3.09 00:33.1 9.3%;  Surgeon: Lockie MolaBrasington, Chadwick, MD;  [facility];  Service: Ophthalmology;  Laterality: Right;  . COLONOSCOPY  08/2011   diverticulosis, int hem (Iftikhar)  . CYSTOSCOPY W/ RETROGRADES Left 10/29/2017   Procedure: CYSTOSCOPY WITH RETROGRADE PYELOGRAM;  Surgeon: Riki AltesStoioff, Scott C, MD;  Location: [facility];  Service: Urology;  Laterality: Left;  . CYSTOSCOPY W/ URETERAL STENT PLACEMENT Left 12/03/2017   Procedure: CYSTOSCOPY WITH STENT REPLACEMENT;  Surgeon: Riki AltesStoioff, Scott C, MD;  Location: [facility];  Service: Urology;  Laterality: Left;  . CYSTOSCOPY WITH STENT PLACEMENT Left 10/29/2017   Procedure: CYSTOSCOPY WITH STENT PLACEMENT;  Surgeon: Riki AltesStoioff, Scott C, MD;  Location: [facility];  Service: Urology;  Laterality: Left;  . CYSTOSCOPY WITH URETEROSCOPY Left 12/03/2017   Procedure: CYSTOSCOPY WITH URETEROSCOPY;  Surgeon: Riki AltesStoioff, Scott C, MD;  Location: [facility];  Service: Urology;  Laterality: Left;  . ESI  multiple, latest 03/2015     R L5/S1, L L3/4, L L4/5 L5/S1, L L4/5 and L5/S1 - minimal relief (Chasnis)  . ESOPHAGOGASTRODUODENOSCOPY  08/2011   WNL  . EYE SURGERY Bilateral    laser eye surgery for glaucoma  . FOOT SURGERY Right   . TOE SURGERY Right 2005   titanium for toe replacement  . VAGINAL HYSTERECTOMY  1980s   prolapsed uterus, ovaries remained    Family History  Problem Relation Age of Onset  . Stroke Father 48       hemorrhagic bleed  . Hypertension Father   . Cancer Mother [id number]       breast  . Breast cancer Mother [phone]  . Cancer Brother [id number]       lung (nonsmoker)  . Breast cancer Paternal Grandmother   . CAD Neg Hx   . Diabetes Neg Hx     Social History   Tobacco Use  . Smoking status: Never Smoker  . Smokeless tobacco: Never Used  Substance Use Topics  . Alcohol use: No    Alcohol/week: 0.0 standard drinks  . Drug use: No    Per HPI unless specifically indicated in ROS section below Review of Systems  Constitutional: Negative for activity change, appetite change, chills, fatigue, fever and unexpected weight change.  HENT: Negative for hearing loss.   Eyes: Negative for visual disturbance.  Respiratory: Negative for cough, chest tightness, shortness of breath and wheezing.   Cardiovascular: Positive for leg swelling. Negative for chest pain and palpitations.  Gastrointestinal: Positive for constipation. Negative for abdominal distention, abdominal pain, blood in stool, diarrhea, nausea and vomiting.  Genitourinary: Negative for difficulty urinating and hematuria.  Musculoskeletal: Negative for arthralgias, myalgias and neck pain.  Skin: Negative for rash.  Neurological: Negative for dizziness, seizures, syncope and headaches.  Hematological: Negative for adenopathy. Does not bruise/bleed easily.  Psychiatric/Behavioral: Positive for dysphoric mood. The patient is nervous/anxious.        Stressed with upcoming changes   Objective:    BP 136/80 (BP Location: Left Arm, Patient  Position: Sitting, Cuff Size: Normal)   Pulse 87   Temp 98 F (36.7 C) (Temporal)   Ht 4\' 10"  (1.473 m)   Wt  126 lb 5 oz (57.3 kg)   SpO2 97%   BMI 26.40 kg/m   Wt Readings from Last 3 Encounters:  11/03/19 126 lb 5 oz (57.3 kg)  10/23/19 121 lb 6.4 oz (55.1 kg)  10/04/19 125 lb (56.7 kg)    Physical Exam Vitals and nursing note reviewed.  Constitutional:      General: She is not in acute distress.    Appearance: Normal appearance. She is well-developed. She is not ill-appearing.  HENT:     Head: Normocephalic and atraumatic.     Right Ear: Hearing, tympanic membrane, ear canal and external ear normal.     Left Ear: Hearing, tympanic membrane, ear canal and external ear normal.     Mouth/Throat:     Pharynx: Uvula midline.  Eyes:     General: No scleral icterus.    Extraocular Movements: Extraocular movements intact.     Conjunctiva/sclera: Conjunctivae normal.     Pupils: Pupils are equal, round, and reactive to light.  Cardiovascular:     Rate and Rhythm: Normal rate and regular rhythm.     Pulses: Normal pulses.          Radial pulses are 2+ on the right side and 2+ on the left side.     Heart sounds: Normal heart sounds. No murmur.  Pulmonary:     Effort: Pulmonary effort is normal. No respiratory distress.     Breath sounds: Normal breath sounds. No wheezing, rhonchi or rales.  Abdominal:     General: Abdomen is flat. Bowel sounds are normal. There is no distension.     Palpations: Abdomen is soft. There is no mass.     Tenderness: There is no abdominal tenderness. There is no guarding or rebound.     Hernia: No hernia is present.  Musculoskeletal:        General: Normal range of motion.     Cervical back: Normal range of motion and neck supple.     Right lower leg: No edema.     Left lower leg: No edema.     Comments:  L leg circ 39cm R leg circ 37cm 1+ DP bilaterally  Lymphadenopathy:     Cervical: No cervical adenopathy.  Skin:    General: Skin is warm  and dry.     Findings: No rash.  Neurological:     General: No focal deficit present.     Mental Status: She is alert and oriented to person, place, and time.     Comments: CN grossly intact, station and gait intact  Psychiatric:        Mood and Affect: Mood normal.        Behavior: Behavior normal.        Thought Content: Thought content normal.        Judgment: Judgment normal.       Results for orders placed or performed in visit on 10/23/19  CBC with Differential/Platelet  Result Value Ref Range   WBC 9.1 4.0 - 10.5 K/uL   RBC 3.90 3.87 - 5.11 Mil/uL   Hemoglobin 12.3 12.0 - 15.0 g/dL   HCT 03.0 09.2 - 33.0 %   MCV 93.8 78.0 - 100.0 fl   MCHC 33.7 30.0 - 36.0 g/dL   RDW 07.6 22.6 - 33.3 %   Platelets 279.0 150.0 - 400.0 K/uL   Neutrophils Relative % 68.4 43.0 - 77.0 %   Lymphocytes Relative 21.6 12.0 - 46.0 %   Monocytes Relative 7.9 3.0 -  12.0 %   Eosinophils Relative 1.7 0.0 - 5.0 %   Basophils Relative 0.4 0.0 - 3.0 %   Neutro Abs 6.2 1.4 - 7.7 K/uL   Lymphs Abs 2.0 0.7 - 4.0 K/uL   Monocytes Absolute 0.7 0.1 - 1.0 K/uL   Eosinophils Absolute 0.2 0.0 - 0.7 K/uL   Basophils Absolute 0.0 0.0 - 0.1 K/uL  Ferritin  Result Value Ref Range   Ferritin 33.9 10.0 - 291.0 ng/mL  Vitamin B12  Result Value Ref Range   Vitamin B-12 976 (H) 211 - 911 pg/mL  Hemoglobin A1c  Result Value Ref Range   Hgb A1c MFr Bld 6.1 4.6 - 6.5 %  Comprehensive metabolic panel  Result Value Ref Range   Sodium 142 135 - 145 mEq/L   Potassium 4.1 3.5 - 5.1 mEq/L   Chloride 104 96 - 112 mEq/L   CO2 26 19 - 32 mEq/L   Glucose, Bld 102 (H) 70 - 99 mg/dL   BUN 23 6 - 23 mg/dL   Creatinine, Ser 9.38 0.40 - 1.20 mg/dL   Total Bilirubin 0.6 0.2 - 1.2 mg/dL   Alkaline Phosphatase 74 39 - 117 U/L   AST 26 0 - 37 U/L   ALT 17 0 - 35 U/L   Total Protein 7.3 6.0 - 8.3 g/dL   Albumin 4.7 3.5 - 5.2 g/dL   GFR 10.17 >51.02 mL/min   Calcium 10.8 (H) 8.4 - 10.5 mg/dL  Lipid panel  Result Value Ref  Range   Cholesterol 228 (H) 0 - 200 mg/dL   Triglycerides 585.2 (H) 0.0 - 149.0 mg/dL   HDL 77.82 >42.35 mg/dL   VLDL 36.1 0.0 - 44.3 mg/dL   LDL Cholesterol 154 (H) 0 - 99 mg/dL   Total CHOL/HDL Ratio 3    NonHDL 144.73    Assessment & Plan:  This visit occurred during the SARS-CoV-2 public health emergency.  Safety protocols were in place, including screening questions prior to the visit, additional usage of staff PPE, and extensive cleaning of exam room while observing appropriate contact time as indicated for disinfecting solutions.   Problem List Items Addressed This Visit    Vitamin B12 deficiency    b12 elevated now. Will change to QOD dosing of tablets.       Urge incontinence    Difficulty affording effective medication. Currently on oxybutynin TID PRN. Discussed possible taper =if nortriptyline effective.       Primary open angle glaucoma (POAG) of right eye, severe stage    S/p recent bilateral goniotomy at time of cataract extraction.       Prediabetes    Encouraged limiting added sugars in diet.       Osteopenia    DEXA 10/2017 T score -2.3 hip  Encouraged regular weight bearing exercises - this is limited by back pain.      Lumbar stenosis with neurogenic claudication    Followed by PMR.       Relevant Medications   nortriptyline (PAMELOR) 25 MG capsule   Left leg swelling    Chronic. Had negative venous US 2013. Did not tolerate HCTZ. Pulses ok, not consistent with claudication. Discussed trial compression stockings.       IDA (iron deficiency anemia)    Continue QOD oral iron replacement.       Relevant Medications   vitamin B-12 (CYANOCOBALAMIN) 500 MCG tablet   HTN (hypertension)    Chronic, stable. Continue current regimen.  HLD (hyperlipidemia)    Chronic, stable on simvastatin and fish oil.  The 10-year ASCVD risk score Mikey Bussing DC Brooke Bonito., et al., 2013) is: 26.6%   Values used to calculate the score:     Age: 43 years     Sex:  Female     Is Non-Hispanic African American: No     Diabetic: No     Tobacco smoker: No     Systolic Blood Pressure: 628 mmHg     Is BP treated: Yes     HDL Cholesterol: 82.9 mg/dL     Total Cholesterol: 228 mg/dL       Health maintenance examination - Primary    Preventative protocols reviewed and updated unless pt declined. Discussed healthy diet and lifestyle.       Glaucoma    Recent bilateral cataract extraction and goniotomy (10/2019)      GERD (gastroesophageal reflux disease)    Managed with pepcid.       DJD (degenerative joint disease) of knee    Has received steroid and synvisc injections, has declined knee surgery.       DDD (degenerative disc disease), lumbar    Has had ESI in the past, hydrocodone through PMR.       Chronic leg pain    Longstanding, known lumbar DDD with stenosis/neurogenic claudication followed by PMR. Did not tolerate gabapentin (blurry vision, off balance). Will trial TCA (nortriptyline 25mg  nightly). Would consider trial of lyrica in the future. She also continues hydrocodone.       Relevant Medications   nortriptyline (PAMELOR) 25 MG capsule   Chronic constipation    Thought narcotic related - managed with daily metamucil and sennakot      Advanced care planning/counseling discussion    Advanced directives: has not set up but has discussed with children. HCPOA - would be children. Packet previously provided today.          Meds ordered this encounter  Medications  . nortriptyline (PAMELOR) 25 MG capsule    Sig: Take 1 capsule (25 mg total) by mouth at bedtime.    Dispense:  30 capsule    Refill:  3  . vitamin B-12 (CYANOCOBALAMIN) 500 MCG tablet    Sig: Take 1 tablet (500 mcg total) by mouth every other day.   No orders of the defined types were placed in this encounter.   Patient instructions: Look into compression stockings to help leg swelling.  Try nortriptyline 25mg  at night time for sleep and for leg pain. If  helping urination, may stop oxybutynin. Alternatively may discuss trial lyrica in the future for leg pain.  Decrease vitamin B12 to every other day.  Good to see you today.  Best of luck with the new move! See if Dr Catalina Lunger is available in [place].   Follow up plan: Return as needed.  Ria Bush, MD

## 2019-11-04 ENCOUNTER — Encounter: Payer: Self-pay | Admitting: Family Medicine

## 2019-11-04 DIAGNOSIS — G8929 Other chronic pain: Secondary | ICD-10-CM | POA: Insufficient documentation

## 2019-11-04 NOTE — Assessment & Plan Note (Signed)
Has received steroid and synvisc injections, has declined knee surgery.

## 2019-11-04 NOTE — Assessment & Plan Note (Signed)
Chronic, stable on simvastatin and fish oil.  The 10-year ASCVD risk score Denman George DC Montez Hageman., et al., 2013) is: 26.6%   Values used to calculate the score:     Age: 74 years     Sex: Female     Is Non-Hispanic African American: No     Diabetic: No     Tobacco smoker: No     Systolic Blood Pressure: 136 mmHg     Is BP treated: Yes     HDL Cholesterol: 82.9 mg/dL     Total Cholesterol: 228 mg/dL

## 2019-11-04 NOTE — Assessment & Plan Note (Signed)
Has had ESI in the past, hydrocodone through PMR.

## 2019-11-04 NOTE — Assessment & Plan Note (Signed)
Longstanding, known lumbar DDD with stenosis/neurogenic claudication followed by PMR. Did not tolerate gabapentin (blurry vision, off balance). Will trial TCA (nortriptyline 25mg  nightly). Would consider trial of lyrica in the future. She also continues hydrocodone.

## 2019-11-04 NOTE — Assessment & Plan Note (Signed)
DEXA 10/2017 T score -2.3 hip  Encouraged regular weight bearing exercises - this is limited by back pain.

## 2019-11-04 NOTE — Assessment & Plan Note (Signed)
Encouraged limiting added sugars in diet.  

## 2019-11-04 NOTE — Assessment & Plan Note (Signed)
Recent bilateral cataract extraction and goniotomy (10/2019)

## 2019-11-04 NOTE — Assessment & Plan Note (Signed)
Chronic. Had negative venous US 2013. Did not tolerate HCTZ. Pulses ok, not consistent with claudication. Discussed trial compression stockings.

## 2019-11-04 NOTE — Assessment & Plan Note (Addendum)
Advanced directives: has not set up but has discussed with children. HCPOA - would be children. Packet previously provided today.

## 2019-11-04 NOTE — Assessment & Plan Note (Signed)
Managed with pepcid.

## 2019-11-04 NOTE — Assessment & Plan Note (Signed)
Chronic, stable. Continue current regimen. 

## 2019-11-04 NOTE — Assessment & Plan Note (Signed)
Preventative protocols reviewed and updated unless pt declined. Discussed healthy diet and lifestyle.  

## 2019-11-04 NOTE — Assessment & Plan Note (Signed)
S/p recent bilateral goniotomy at time of cataract extraction.

## 2019-11-04 NOTE — Assessment & Plan Note (Signed)
Continue QOD oral iron replacement.

## 2019-11-04 NOTE — Assessment & Plan Note (Signed)
Thought narcotic related - managed with daily metamucil and sennakot

## 2019-11-04 NOTE — Assessment & Plan Note (Signed)
Followed by PM&R.  

## 2019-11-04 NOTE — Assessment & Plan Note (Signed)
b12 elevated now. Will change to QOD dosing of tablets.

## 2019-11-04 NOTE — Assessment & Plan Note (Signed)
Difficulty affording effective medication. Currently on oxybutynin TID PRN. Discussed possible taper =if nortriptyline effective.

## 2019-11-25 ENCOUNTER — Other Ambulatory Visit: Payer: Self-pay | Admitting: Family Medicine

## 2019-12-28 IMAGING — CT CT RENAL STONE PROTOCOL
2 of 4 series · 16 of 46 positions shown, 18 images · non-contrast
Comparison: 04/23/2016 abdominal ultrasound and 11/28/2015 lumbar
spine MR

CLINICAL DATA: 72-year-old female with acute LEFT flank and
abdominal pain with nausea.

EXAM:
CT ABDOMEN AND PELVIS WITHOUT CONTRAST
TECHNIQUE: Multidetector CT imaging of the abdomen and pelvis was performed
following the standard protocol without IV contrast.

[Series 2: stone full standard · axial · 0.60mm/px · z∈[-344,+12]mm · 13 of 79 slices shown, 15 images]
[im 4/79  soft-tissue]
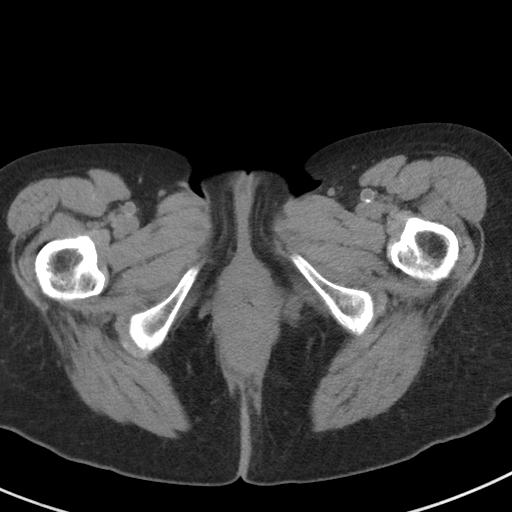
[im 4/79  bone]
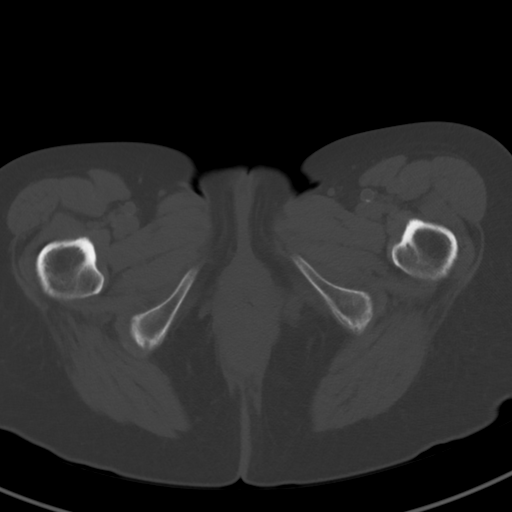
[im 10/79  soft-tissue]
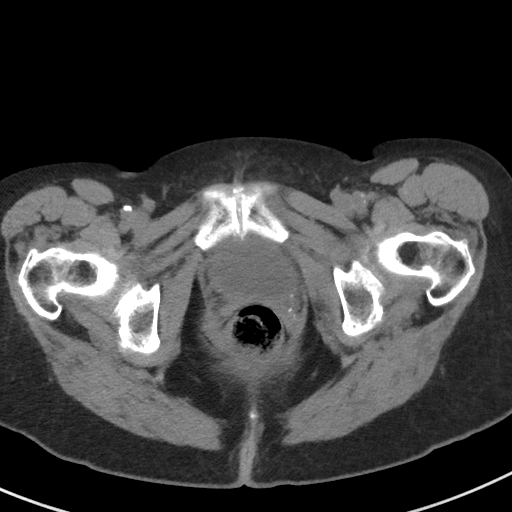
[im 16/79  soft-tissue]
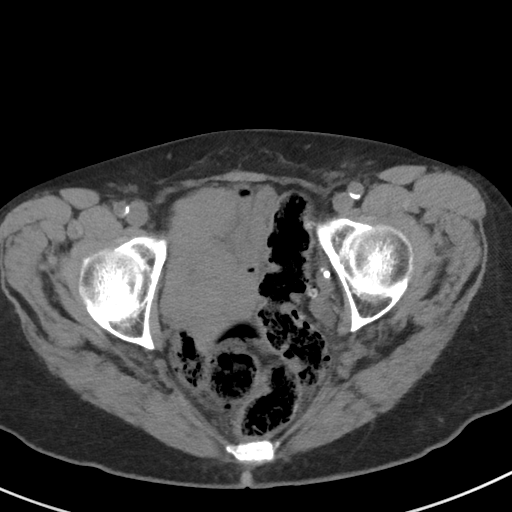
[im 22/79  soft-tissue]
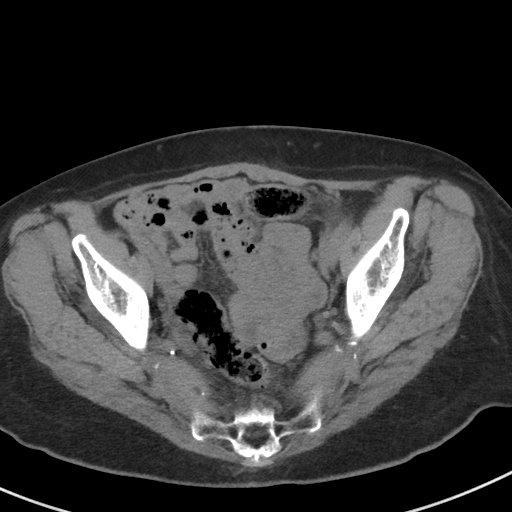
[im 29/79  soft-tissue]
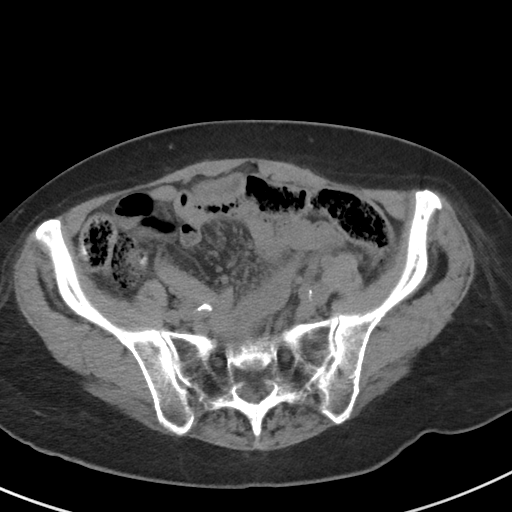
[im 35/79  soft-tissue]
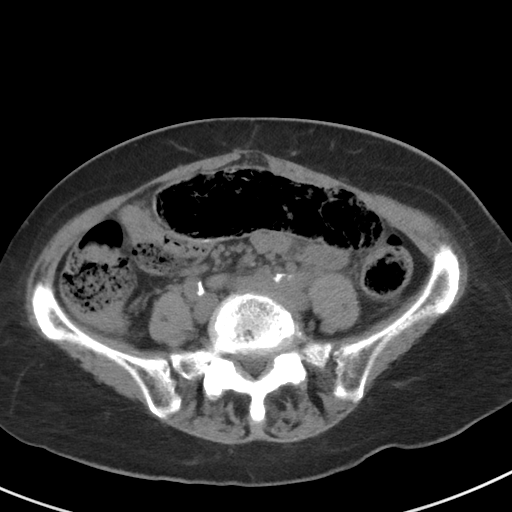
[im 41/79  soft-tissue]
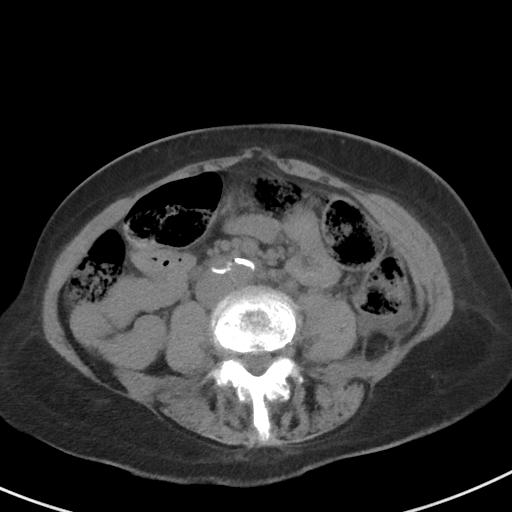
[im 44/79  soft-tissue]
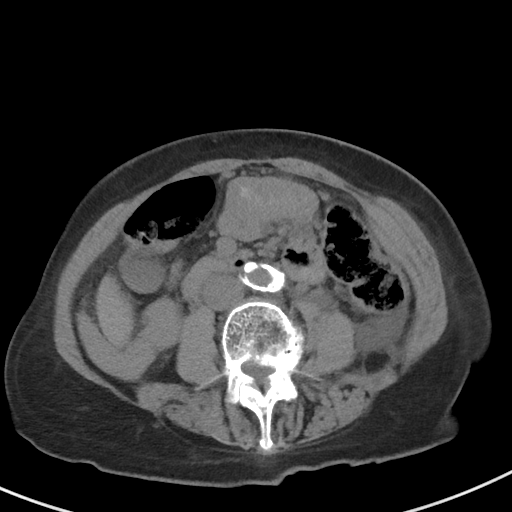
[im 50/79  soft-tissue]
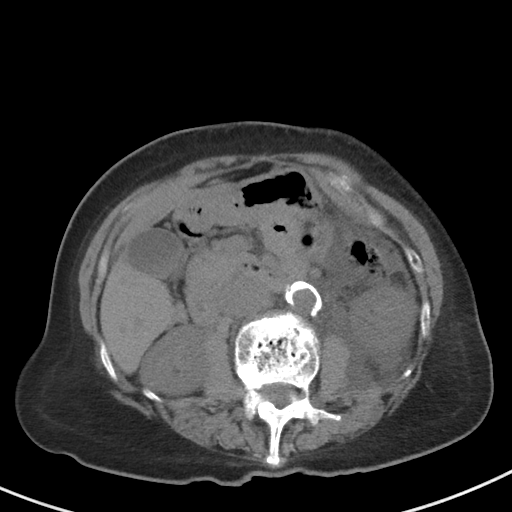
[im 50/79  bone]
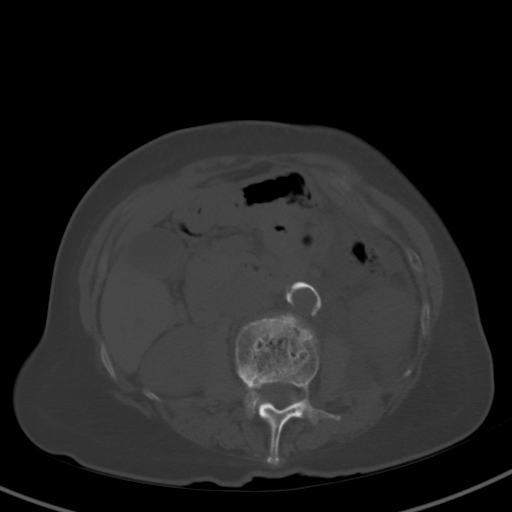
[im 57/79  soft-tissue]
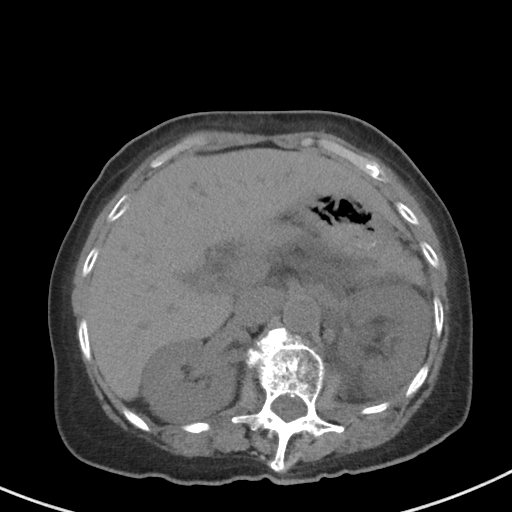
[im 63/79  soft-tissue]
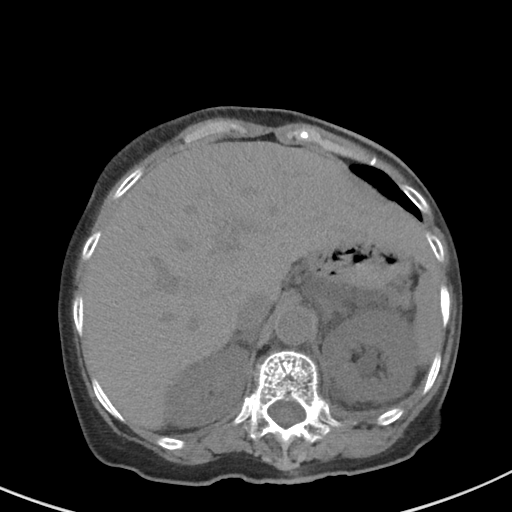
[im 69/79  soft-tissue]
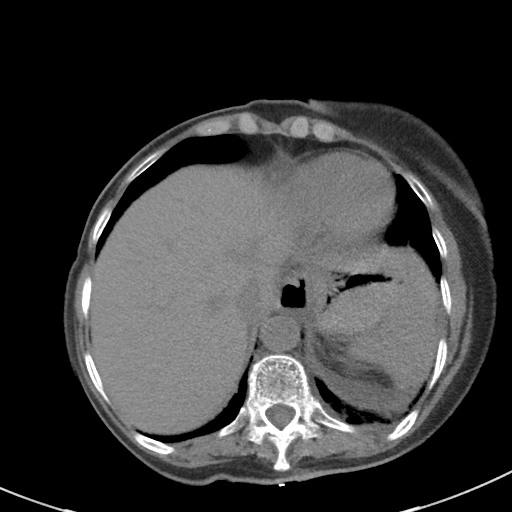
[im 75/79  soft-tissue]
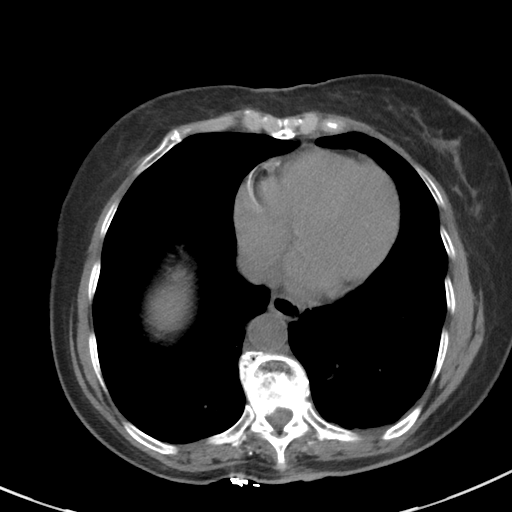

[Series 5: coronal · coronal · 0.63mm/px · 3 of 110 slices shown]
[im 37/110  soft-tissue]
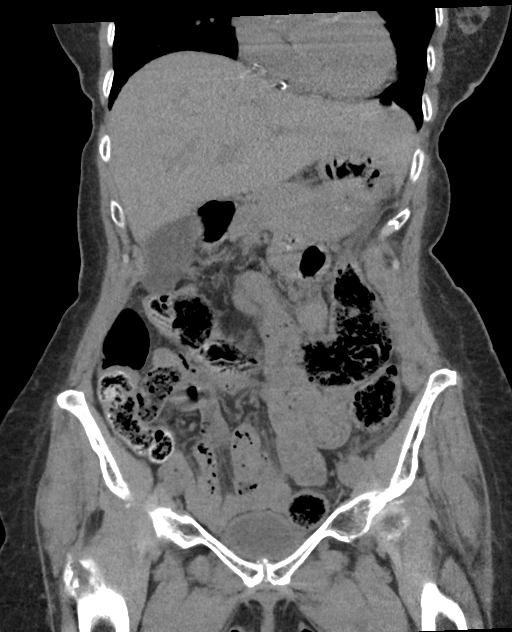
[im 49/110  soft-tissue]
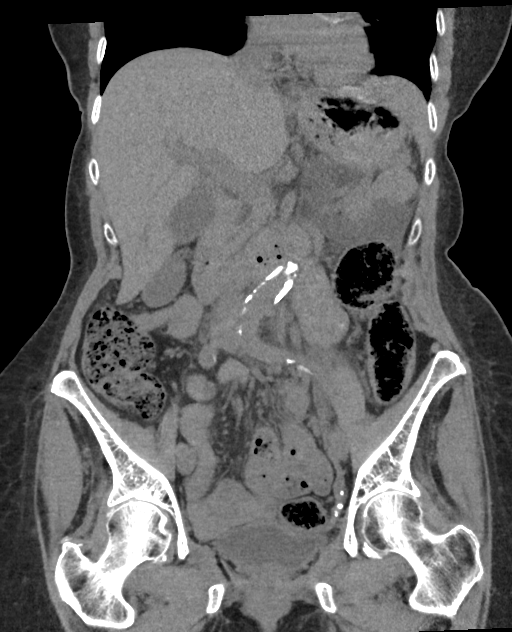
[im 61/110  soft-tissue]
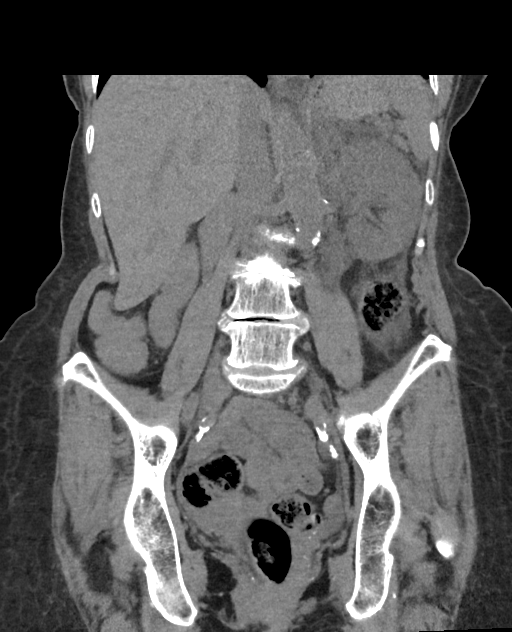

[16 of 46 positions shown; findings below may reference images not displayed]

FINDINGS: Please note that parenchymal abnormalities may be missed without
intravenous contrast.

Lower chest: No acute abnormality

Hepatobiliary: The liver and gallbladder are unremarkable. No
biliary dilatation.

Pancreas: Unremarkable

Spleen: Unremarkable

Adrenals/Urinary Tract: A 4 mm LEFT UVJ calculus causes moderate
LEFT hydroureteronephrosis and perinephric inflammation. No other
urinary calculi are identified.

The adrenal glands are unremarkable.

Stomach/Bowel: There is no evidence of bowel obstruction, definite
bowel wall thickening or bowel inflammatory changes. A small hiatal
hernia is noted.

Vascular/Lymphatic: Aortic atherosclerosis. No enlarged abdominal or
pelvic lymph nodes.

Reproductive: Status post hysterectomy. No adnexal masses.

Other: No ascites, focal collection or pneumoperitoneum.

Musculoskeletal: No acute bony abnormality or suspicious bony lesion
identified. Multilevel degenerative changes throughout the lumbar
spine again noted.
IMPRESSION: 1. 4 mm LEFT UVJ calculus causing moderate LEFT
hydroureteronephrosis and perinephric inflammation.
2.  Aortic Atherosclerosis ([id number]).

## 2020-02-19 ENCOUNTER — Other Ambulatory Visit: Payer: Self-pay | Admitting: Family Medicine

## 2020-08-01 ENCOUNTER — Ambulatory Visit: Payer: Medicare Other | Admitting: Urology

## 2024-10-01 DEATH — deceased
